# Patient Record
Sex: Female | Born: 1945
Health system: Southern US, Community
[De-identification: ages and names within clinical notes are randomized; demographics above are authoritative.]

## PROBLEM LIST (undated history)

## (undated) DIAGNOSIS — G4700 Insomnia, unspecified: Secondary | ICD-10-CM

## (undated) DIAGNOSIS — R112 Nausea with vomiting, unspecified: Secondary | ICD-10-CM

## (undated) DIAGNOSIS — N183 Chronic kidney disease, stage 3 unspecified: Secondary | ICD-10-CM

## (undated) DIAGNOSIS — Z9889 Other specified postprocedural states: Secondary | ICD-10-CM

## (undated) DIAGNOSIS — E785 Hyperlipidemia, unspecified: Secondary | ICD-10-CM

## (undated) DIAGNOSIS — J189 Pneumonia, unspecified organism: Secondary | ICD-10-CM

## (undated) DIAGNOSIS — F419 Anxiety disorder, unspecified: Secondary | ICD-10-CM

## (undated) DIAGNOSIS — M1990 Unspecified osteoarthritis, unspecified site: Secondary | ICD-10-CM

## (undated) DIAGNOSIS — F32A Depression, unspecified: Secondary | ICD-10-CM

## (undated) DIAGNOSIS — I10 Essential (primary) hypertension: Secondary | ICD-10-CM

## (undated) DIAGNOSIS — R519 Headache, unspecified: Secondary | ICD-10-CM

## (undated) DIAGNOSIS — F329 Major depressive disorder, single episode, unspecified: Secondary | ICD-10-CM

## (undated) DIAGNOSIS — T4145XA Adverse effect of unspecified anesthetic, initial encounter: Secondary | ICD-10-CM

## (undated) HISTORY — PX: BACK SURGERY: SHX140

## (undated) HISTORY — PX: SMALL INTESTINE SURGERY: SHX150

## (undated) HISTORY — PX: TONSILLECTOMY: SUR1361

## (undated) HISTORY — DX: Chronic kidney disease, stage 3 unspecified: N18.30

## (undated) HISTORY — PX: OTHER SURGICAL HISTORY: SHX169

## (undated) HISTORY — DX: Chronic kidney disease, stage 3 (moderate): N18.3

## (undated) HISTORY — PX: ABDOMINAL HYSTERECTOMY: SHX81

---

## 1998-12-27 ENCOUNTER — Other Ambulatory Visit: Admission: RE | Admit: 1998-12-27 | Discharge: 1998-12-27 | Payer: Self-pay | Admitting: Gynecology

## 2000-01-02 ENCOUNTER — Other Ambulatory Visit: Admission: RE | Admit: 2000-01-02 | Discharge: 2000-01-02 | Payer: Self-pay | Admitting: Gynecology

## 2000-12-07 ENCOUNTER — Other Ambulatory Visit: Admission: RE | Admit: 2000-12-07 | Discharge: 2000-12-07 | Payer: Self-pay | Admitting: Gynecology

## 2001-03-31 ENCOUNTER — Encounter: Admission: RE | Admit: 2001-03-31 | Discharge: 2001-03-31 | Payer: Self-pay | Admitting: Family Medicine

## 2001-03-31 ENCOUNTER — Encounter: Payer: Self-pay | Admitting: Family Medicine

## 2001-04-12 ENCOUNTER — Ambulatory Visit (HOSPITAL_COMMUNITY): Admission: RE | Admit: 2001-04-12 | Discharge: 2001-04-12 | Payer: Self-pay | Admitting: *Deleted

## 2001-12-14 ENCOUNTER — Other Ambulatory Visit: Admission: RE | Admit: 2001-12-14 | Discharge: 2001-12-14 | Payer: Self-pay | Admitting: Gynecology

## 2002-12-15 ENCOUNTER — Other Ambulatory Visit: Admission: RE | Admit: 2002-12-15 | Discharge: 2002-12-15 | Payer: Self-pay | Admitting: Gynecology

## 2004-01-09 ENCOUNTER — Other Ambulatory Visit: Admission: RE | Admit: 2004-01-09 | Discharge: 2004-01-09 | Payer: Self-pay | Admitting: Gynecology

## 2005-01-13 ENCOUNTER — Other Ambulatory Visit: Admission: RE | Admit: 2005-01-13 | Discharge: 2005-01-13 | Payer: Self-pay | Admitting: Gynecology

## 2006-01-14 ENCOUNTER — Other Ambulatory Visit: Admission: RE | Admit: 2006-01-14 | Discharge: 2006-01-14 | Payer: Self-pay | Admitting: Gynecology

## 2007-01-18 ENCOUNTER — Encounter (INDEPENDENT_AMBULATORY_CARE_PROVIDER_SITE_OTHER): Payer: Self-pay | Admitting: Family Medicine

## 2007-01-18 ENCOUNTER — Ambulatory Visit: Payer: Self-pay | Admitting: Internal Medicine

## 2007-01-18 LAB — CONVERTED CEMR LAB
Albumin: 4.8 g/dL (ref 3.5–5.2)
Alkaline Phosphatase: 55 units/L (ref 39–117)
CO2: 28 meq/L (ref 19–32)
Calcium: 9.3 mg/dL (ref 8.4–10.5)
Chloride: 106 meq/L (ref 96–112)
Eosinophils Absolute: 0.3 10*3/uL (ref 0.0–0.7)
Glucose, Bld: 89 mg/dL (ref 70–99)
LDL Cholesterol: 101 mg/dL — ABNORMAL HIGH (ref 0–99)
Lymphocytes Relative: 27 % (ref 12–46)
Lymphs Abs: 1.4 10*3/uL (ref 0.7–3.3)
Neutro Abs: 2.9 10*3/uL (ref 1.7–7.7)
Neutrophils Relative %: 58 % (ref 43–77)
Platelets: 230 10*3/uL (ref 150–400)
Potassium: 5 meq/L (ref 3.5–5.3)
RBC: 4.27 M/uL (ref 3.87–5.11)
Sodium: 142 meq/L (ref 135–145)
Total Protein: 7.5 g/dL (ref 6.0–8.3)
Triglycerides: 204 mg/dL — ABNORMAL HIGH (ref <150)
WBC: 5 10*3/uL (ref 4.0–10.5)

## 2007-02-10 ENCOUNTER — Ambulatory Visit: Payer: Self-pay | Admitting: Internal Medicine

## 2007-02-17 ENCOUNTER — Ambulatory Visit (HOSPITAL_COMMUNITY): Admission: RE | Admit: 2007-02-17 | Discharge: 2007-02-17 | Payer: Self-pay | Admitting: Family Medicine

## 2007-02-22 ENCOUNTER — Ambulatory Visit: Payer: Self-pay | Admitting: Internal Medicine

## 2007-02-23 ENCOUNTER — Ambulatory Visit: Payer: Self-pay | Admitting: *Deleted

## 2007-02-24 ENCOUNTER — Ambulatory Visit: Payer: Self-pay | Admitting: Family Medicine

## 2007-02-24 ENCOUNTER — Ambulatory Visit: Payer: Self-pay | Admitting: Internal Medicine

## 2007-04-12 ENCOUNTER — Ambulatory Visit: Payer: Self-pay | Admitting: Family Medicine

## 2007-04-12 ENCOUNTER — Encounter (INDEPENDENT_AMBULATORY_CARE_PROVIDER_SITE_OTHER): Payer: Self-pay | Admitting: Internal Medicine

## 2007-04-12 LAB — CONVERTED CEMR LAB
HDL: 47 mg/dL (ref >39)
LDL Cholesterol: 88 mg/dL (ref 0–99)
Triglycerides: 91 mg/dL (ref <150)

## 2007-06-18 ENCOUNTER — Ambulatory Visit: Payer: Self-pay | Admitting: Family Medicine

## 2007-07-01 ENCOUNTER — Ambulatory Visit: Payer: Self-pay | Admitting: Internal Medicine

## 2007-07-13 ENCOUNTER — Ambulatory Visit: Payer: Self-pay | Admitting: Internal Medicine

## 2007-07-22 ENCOUNTER — Ambulatory Visit: Payer: Self-pay | Admitting: Internal Medicine

## 2007-08-06 ENCOUNTER — Ambulatory Visit: Payer: Self-pay | Admitting: Internal Medicine

## 2007-08-19 ENCOUNTER — Ambulatory Visit: Payer: Self-pay | Admitting: Internal Medicine

## 2007-08-30 ENCOUNTER — Ambulatory Visit: Payer: Self-pay | Admitting: Internal Medicine

## 2007-10-16 ENCOUNTER — Inpatient Hospital Stay (HOSPITAL_COMMUNITY): Admission: EM | Admit: 2007-10-16 | Discharge: 2007-10-25 | Payer: Self-pay | Admitting: Emergency Medicine

## 2007-11-18 ENCOUNTER — Other Ambulatory Visit: Admission: RE | Admit: 2007-11-18 | Discharge: 2007-11-18 | Payer: Self-pay | Admitting: Gynecology

## 2007-11-25 ENCOUNTER — Encounter: Admission: RE | Admit: 2007-11-25 | Discharge: 2007-11-25 | Payer: Self-pay | Admitting: General Surgery

## 2008-05-05 HISTORY — PX: TRIGGER FINGER RELEASE: SHX641

## 2009-02-07 ENCOUNTER — Ambulatory Visit (HOSPITAL_BASED_OUTPATIENT_CLINIC_OR_DEPARTMENT_OTHER): Admission: RE | Admit: 2009-02-07 | Discharge: 2009-02-07 | Payer: Self-pay | Admitting: Orthopedic Surgery

## 2010-06-05 HISTORY — PX: TRIGGER FINGER RELEASE: SHX641

## 2010-06-12 ENCOUNTER — Ambulatory Visit (HOSPITAL_BASED_OUTPATIENT_CLINIC_OR_DEPARTMENT_OTHER)
Admission: RE | Admit: 2010-06-12 | Discharge: 2010-06-12 | Disposition: A | Discharge status: Home / Self Care | Payer: MEDICARE | Source: Ambulatory Visit | Attending: Orthopedic Surgery | Admitting: Orthopedic Surgery

## 2010-06-12 DIAGNOSIS — M6530 Trigger finger, unspecified finger: Secondary | ICD-10-CM | POA: Insufficient documentation

## 2010-06-12 DIAGNOSIS — M65849 Other synovitis and tenosynovitis, unspecified hand: Secondary | ICD-10-CM | POA: Insufficient documentation

## 2010-06-12 DIAGNOSIS — M65839 Other synovitis and tenosynovitis, unspecified forearm: Secondary | ICD-10-CM | POA: Insufficient documentation

## 2010-06-12 LAB — POCT I-STAT, CHEM 8
BUN: 20 mg/dL (ref 6–23)
Calcium, Ion: 1.08 mmol/L — ABNORMAL LOW (ref 1.12–1.32)
Creatinine, Ser: 1.2 mg/dL (ref 0.4–1.2)
TCO2: 22 mmol/L (ref 0–100)

## 2010-06-24 NOTE — Op Note (Signed)
[name], Gloria [name]                ACCOUNT NO.:  [id number]  MEDICAL RECORD NO.:  [id number]           PATIENT TYPE:  LOCATION:                                 FACILITY:  PHYSICIAN:  Cindee Salt, M.D.            DATE OF BIRTH:  DATE OF PROCEDURE:  06/12/2010 DATE OF DISCHARGE:                              OPERATIVE REPORT   PREOPERATIVE DIAGNOSIS:  Stenosing tenosynovitis, right middle, right ring finger.  POSTOPERATIVE DIAGNOSIS:  Stenosing tenosynovitis, right middle, right ring finger.  OPERATION:  Release of A1 pulley, right middle, right ring finger.  SURGEON:  Cindee Salt, MD  ANESTHESIA:  Forearm based IV regional with local infiltration.  ANESTHESIOLOGIST:  Janetta Hora. Gelene Mink, MD  HISTORY:  The patient is a 65 year old female with a history of triggering of her right middle, right ring finger.  This is not responded to conservative treatment.  She has elected to undergo surgical decompression of the A1 pulleys of both fingers.  Pre, peri, and postoperative courses have been discussed along with risks and complication.  She is aware that there is no guarantee with the surgery, possibility of infection, recurrence of injury to arteries, nerves, and tendons, incomplete relief of symptoms, and dystrophy.  In the preoperative area, the patient is seen, the extremity marked by both the patient and surgeon, and antibiotic given.  PROCEDURE:  The patient was brought to the operating room where a forearm based IV regional anesthetic was carried out without difficulty. She was prepped using ChloraPrep, supine position, right arm free.  A 3- minute dry time was allowed.  Time-out taken confirming the patient and procedure.  A local infiltration was given with 0.25% Marcaine without epinephrine in 3 mL total for the two fingers was used.  After adequate anesthesia was afforded, an oblique incision was made over the A1 pulley of the middle and then ring finger carried down  through the subcutaneous tissue.  Bleeders were electrocauterized with bipolar.  Dissection was carried down to the A1 pulley.  Radial and ulnar neurovascular bundles were identified.  Retractor was placed.  Incisions were then made in the radial aspect of the A1 pulley.  Small incision was made centrally in the A2 pulley on each finger.  These were done separately.  The fingers were placed through a full range of motion and further triggering was noted.  A partial tenosynovectomy proximal to the A1 pulley was then performed on each finger separating the adherent tenosynovium between the superficialis profundus on each fingers.  Wounds were copiously irrigated with saline.  Skin was closed with interrupted 5-0 Vicryl Rapide suture.  Sterile compressive dressing was applied.  No splint applied, fingers were left free.  On deflation of the tourniquet, all fingers were immediately pinked.  She was taken to the recovery room for observation in satisfactory condition.  She will be discharged home to return to the [facility] [place] in 1 week, on Vicodin.    ______________________________ Cindee Salt, M.D.   ______________________________ Cindee Salt, M.D.    GK/MEDQ  D:  06/12/2010  T:  06/13/2010  Job:  [id number]  Electronically Signed by Cindee Salt M.D. on 06/24/2010 02:25:32 PM

## 2010-08-08 LAB — BASIC METABOLIC PANEL
CO2: 29 mEq/L (ref 19–32)
Calcium: 9.2 mg/dL (ref 8.4–10.5)
Creatinine, Ser: 1 mg/dL (ref 0.4–1.2)
GFR calc Af Amer: >60 mL/min (ref >60)
Glucose, Bld: 154 mg/dL — ABNORMAL HIGH (ref 70–99)

## 2010-09-17 NOTE — Consult Note (Signed)
NAMEMAKALYA, NAVE                ACCOUNT NO.:  [id number]   MEDICAL RECORD NO.:  [id number]          PATIENT TYPE:  INP   LOCATION:  1306                         FACILITY:  [facility]   PHYSICIAN:  Antonietta Breach, M.D.  DATE OF BIRTH:  08-Apr-1946   DATE OF CONSULTATION:  10/21/2007  DATE OF DISCHARGE:                                 CONSULTATION   REASON FOR CONSULTATION:  Anxiety.   HISTORY OF PRESENT ILLNESS:  Mrs. Mort is a 65 year old female  admitted to [facility] __________ due to small bowel  obstruction.   Additional stress for the patient has been the onset of Parkinson's  disease in her husband.   The patient has had some mild return of depressed mood, decreased energy  and difficulty concentrating.  She also has had the onset of severe  anxiety episodes involving some general shaking of her body, tachycardia  and feeling on edge.  She does have a history of post-traumatic symptoms  due to multiple abdominal surgeries in the past for small-bowel  obstruction.   The patient has no thoughts of harming herself.  She has no thoughts of  harming others.  She has no delusions or hallucinations.  She has  remained completely oriented, coherent and with intact memory during the  shaking episodes.   She has not had any racing thoughts.  She is cooperative with all  bedside care.   The increased anxiety has been occurring over the past 3 days.  Her mild  depressive symptoms have returned with the onset of the bowel  obstruction.   PAST PSYCHIATRIC HISTORY:  The patient first developed major depression  approximately 3 years ago.  She was initially treated at the [facility] with Paxil.  The Paxil improved her anxiety but did not help her  depression.   She was later switched over to Cymbalta, and this was titrated  eventually up to 120 mg daily.  In lower dosages of Cymbalta, her  depression responded well.  However, she continued to have anxiety  symptoms, and the Cymbalta was then titrated up to 120 mg daily.  She  states that when the Cymbalta is high, she __________.  She has also  clearly correlated an increase in constipation with the Cymbalta.   One year ago the patient stopped her Cymbalta, and she had a return of  severe major depression.  Her depression has involved thoughts of  wanting to die but no suicidal thoughts.   The patient has been through counseling, but she states that she has  never had cognitive behavioral therapy with deep breathing and  progressive muscle relaxation training.  This was confirmed when the  undersigned explained the details of this therapy, and she was not  familiar with it at all.   The patient has also utilized Xanax 0.5 mg b.i.d. for acute feeling on  edge at home.  This works well.  She requires Ambien 10 mg q.h.s. for  insomnia.   The patient has had post-traumatic symptoms from the multiple bowel  surgeries.   FAMILY PSYCHIATRIC HISTORY:  None known.  SOCIAL HISTORY:  Mrs. Leija is married.  She does not have any  children.  She has worked at [employer] for several years.  She is  originally from [place].  She does not use any legal drugs.   PAST MEDICAL HISTORY:  1. The patient had gynecological surgery in her teens which resulted      in abdominal adhesions and subsequent multiple bowel obstructions      and surgeries.  2. The patient has also had a laminectomy in the past.   ALLERGIES:  NO KNOWN DRUG ALLERGIES.   MEDICATIONS:  The MAR is reviewed.  The patient's Cymbalta was stopped  on admission due to the need to use an NG tube.  The patient is now on  Ativan 1 mg q.6h. p.r.n. and Benadryl 20/500 q.6h. p.r.n.   LABORATORY DATA:  Sodium 140, BUN 5, creatinine 0.69, glucose 119, WBC  8.4, hemoglobin 0.4, platelet count 236, SGOT 17, SGPT 17.   REVIEW OF SYSTEMS:  Constitutional, head, eyes, ears nose, throat,  mouth, neurologic, psychiatric, cardiovascular,  respiratory,  gastrointestinal, genitourinary, skin, musculoskeletal, lymphatic,  endocrine, metabolic all unremarkable.   PHYSICAL EXAMINATION:  VITAL SIGNS:  Temperature 98, pulse 89,  respiratory rate 18, blood pressure 149/82, O2 saturation on 3 liters  98%.  GENERAL APPEARANCE:  Mrs. Mikrut is a middle-aged female partially  reclined in a supine position in her hospital bed with the NG tube in  place.  She has no abnormal involuntary movements.   MENTAL STATUS EXAM:  Mrs. Pinheiro is under mild Ativan sedation to  control her anxiety.  Her attention span is mildly decreased.  Her  alertness is slightly decreased.  Her eye contact is excellent.  She is  oriented completely to all spheres.  Her memory function is intact for  immediate and remote.  Her fund of knowledge and intelligence are within  normal limits.  Her speech is very soft.  There is a slightly flat  prosody.  There is no dysarthria.  Affect is mildly constricted.  Mood  is mildly anxious.  Thought process is logical, coherent, goal-directed.  Thought content with no thoughts of harming herself, no thoughts of  harming others, no delusions, no hallucinations.  Her insight is good.  Her judgment is intact.   ASSESSMENT:  AXIS I:  293.84 - anxiety disorder not otherwise specified,  rule out post-traumatic stress disorder.  Major depressive disorder,  recurrent, in partial remission.  AXIS II:  None.  AXIS III:  See past medical history.  AXIS IV:  General medical.  AXIS V:  55.   Mrs. Braunschweig is not at risk to harm herself or others.  She agrees to  call emergency services immediately for any thoughts of harming herself  or other psychiatric emergencies.   The undersigned provided ego supportive psychotherapy and education, an  introduction to cognitive behavioral therapy was given and the potential  efficacy.   The indications, alternatives and adverse effects of several agents were  discussed including Paxil,  Cymbalta, Paxil alone or Paxil combined with  Wellbutrin, Xanax, Ambien, Ativan.   The patient understands and would like to proceed as below with the  possibility of other medication alternatives, proceeding later as an  outpatient.   RECOMMENDATIONS:  1. Would restart the Cymbalta at 30 mg p.o. q.a.m. when the NG tube is      clamped for 1 hour.  Would then increase as tolerated to 60 mg p.o.  q.a.m.  Once the NG tube can be removed, optimal dosing would be 30      mg b.i.d.  The medication can be further titrated as an outpatient.      However, to avoid the side effect of fatigue with increased doses      of Cymbalta, a strategy of cognitive behavioral therapy with deep      breathing and progressive muscle relaxation could help reduce the      anxiety symptoms without having to resort to greater      pharmacotherapy.  Also, the psychotherapy could reduce the long-      term need of benzodiazepine use.   This psychotherapy can be arranged with a therapist trained and  experienced in cognitive behavioral therapy with deep breathing and  progressive muscle relaxation.  This therapy has been used effectively  with specifically post-traumatic stress disorder, as well.   Other pharmacotherapy options would include restarting the Paxil and  then adding Wellbutrin for the residual depressive symptoms.  The  tendency to cause constipation can be seen in a number of psychotropic  agents.   The patient is comfortable with the idea of using MiraLax to prevent  constipation.   For now in the hospital regarding the patient's acute anxiety and  insomnia, would continue to proceed with Ativan 0.5 to 1 mg IV q.6h.  p.r.n. anxiety or nocturnal insomnia.  This can be given p.o. or IM if  needed, and once the NG tube is removed, the patient could be switched  back to Xanax at the same dosage as the Ativan.   Another advantage of Ativan is the fact that it does not have any active   metabolites and is only conjugated by the liver, which allows for  greater sedation control while in the hospital.      Antonietta Breach, M.D.  Electronically Signed     JW/MEDQ  D:  10/21/2007  T:  10/21/2007  Job:  [id number]

## 2010-09-17 NOTE — Discharge Summary (Signed)
[name], [name]                ACCOUNT NO.:  [id number]   MEDICAL RECORD NO.:  [id number]          PATIENT TYPE:  INP   LOCATION:  1306                         FACILITY:  [facility]   PHYSICIAN:  Anselm Pancoast. Weatherly, M.D.DATE OF BIRTH:  1945-11-29   DATE OF ADMISSION:  10/16/2007  DATE OF DISCHARGE:  10/25/2007                               DISCHARGE SUMMARY   DISCHARGE DIAGNOSIS:  __________   OPERATION:  Exploratory laparotomy with lysis of adhesions.  __________   HISTORY:  Gloria [name] is a 65 year old female.  She passed __________  and has not __________ problems __________ interim with __________.  She  had had 3 operations for bowel obstruction following __________  and had  had 3 hospitalizations__________.  She moved to [facility], she got a  job __________ stress and __________ problems __________.  She presented  to the emergency room __________ showed very edematous small bowel,  moderate __________ did not __________.  Marland Kitchen  White count when she  arrived__________ and she had amount of  stoolin the colon.  area  __________ where she looked much better with __________ Dr. Daphine Deutscher  __________ watch her carefully __________ OR __________ in [place]  __________resolved __________.  The patient continued to feel better.  She __________ small effusion in the right chest that was present on  initial plain films and CT did not change the etiology of this.  Clinically, she did better with intermittent NG suction.  She was not  actually having bowel function spontaneously.  I repeated the CT. This  shows obstruction secondary to probable adhesions.  __________ assisted.  At the time of surgery, she had significant area of adhesions with near  complete obstruction.  This was freed up..  I had given her psychiatric  .  Postoperatively, she did get a little anxious.  He suggested  __________postop ileus appeared to be __________.  I was given  __________ referral __________.  At this  time __________ .  She  __________ discharge __________.  Following her chest x-ray performed  which really did not show any change Her obstruction is in the__________  right fluid around the bowel that we should have seen __________ fusion  __________  have the __________ chest x-ray or a CT __________           ______________________________  Anselm Pancoast. Zachery Dakins, M.D.     WJW/MEDQ  D:  11/17/2007  T:  11/17/2007  Job:  [id number]

## 2010-09-17 NOTE — H&P (Signed)
NAMEMAMYE, BOLDS                ACCOUNT NO.:  [id number]   MEDICAL RECORD NO.:  [id number]          PATIENT TYPE:  INP   LOCATION:  1306                         FACILITY:  [facility]   PHYSICIAN:  Anselm Pancoast. Weatherly, M.D.DATE OF BIRTH:  06-Sep-1945   DATE OF ADMISSION:  10/16/2007  DATE OF DISCHARGE:                              HISTORY & PHYSICAL   CHIEF COMPLAINT:  Nausea, vomiting, and abdominal pain.   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  She has a past history of having numerous operations for bowel  obstruction when she lived in [place].  This was prior to her moving to  [facility] about 11 years ago, and she states that she has had 5  abdominal surgeries, 3 for bowel obstruction, but then the last 3 years  prior to moving she had 3 hospitalizations for which she was treated  with IV fluids, NG suction, etc., and did not require additional  surgery.  Since moving here, she has not had any abdominal surgery and  on review of her records and the E-chart I find no evidence of any  hospitalizations.  She states that she has had a problem with stress and  etc., and is on Cymbalta 120 mg daily, which she knows causes  constipation, and she is also Xanax 0.5 mg b.i.d.  She is on lisinopril  10 mg a day for mild hypertension and pravastatin 20 mg a day.  As far  as on her previous surgeries, she originally started off with  endometriosis and then had surgery for GYN and then bowel obstructions  on several occasions, and on her incisions it appears that everything  was done through either lower or an upper or a kind of combination  midline incisions.  She said she has had some of her bowel resected, and  she has also had a laminectomy.  Her family history does not appear to  be contributing anything, and she  denies any allergies.   CHRONIC MEDICATIONS:  1. Cymbalta 120 mg daily.  2. Lisinopril 10 mg daily.  3. Pravastatin 20 mg.  4. Oral estrogen.  5. Zolpidem 10 mg a day.  6. Xanax 0.5 mg b.i.d.   She says that her stress situation is partially caused by a husband, who  appears to be coming down with Parkinson disease.  He was not present  when I examined her.  As far as on questioning, she stated that she for  about a week has had problems with constipation.  She has taken stool  softeners from above.  About a month ago, she gave herself an enema and  then she has had basically no results for nearly a week, and then  yesterday at about 3 p.m. started having pretty severe cramping  abdominal pain and then came to the emergency room at approximately 3  a.m.  She was seen by the ER physician and on examination in the ER  when  she came in her pulse was a little elevated of 96, respirations 18, and  she was afebrile, and her blood pressure was 149/84.  They gave her  pretty strong pain medications, Dilaudid, I think, and then I was called  about 7:30 and they said Medicine would see her and possibly admit her,  but the CT that they had performed had question of an ischemic segment  of bowel in the right lower quadrant.  Medicine had not seen her at the  time that I first saw her and on question this certainly appears to me  to be a general surgical issue and not that of a medical issue.   PHYSICAL EXAMINATION:  VITAL SIGNS:  Blood pressure is 135/79, she is  afebrile, her pulse is 85, her respirations 20.  EYES, EARS, NOSE, AND THROAT:  She has had facial plastic surgery,  appears adequately hydrated.  There are no oral lesions and etc.  LUNGS:  Clear.  CARDIAC:  Negative.  ABDOMEN:  Very quiet, but she had received pain medications not too long  prior to the time I saw her, and after reviewing the CT and showing all  the contrast in her stomach in the proximally dilated small bowel, an  NG  tube was placed and about one-and-a-third canisters built up  immediately.  She has a large amount of stool in her colon and the nurse  gave her an enema while she was still in the emergency room with kind of  fair results.  It is now approximately 4 hours later.  The patient is up  on the floor and says she thinks she is feeling better.  She has had a  liter of IV fluids, and on physical exam now she has slight tenderness  in the lower right abdomen, but not that what I would consider a true  surgical abdomen.  The repeat x-rays were obtained, plain, approximately  1 p.m. and it does show that the stool that was over in the rectum,  etc., has been expelled.  There is still a massive amount of stool in  the upper abdomen, transverse colon, and right colon, and I think  another enema later this afternoon would be beneficial.  A repeat white  blood count is 10,200, and she is not on any antibiotics.  She is  afebrile.   IMPRESSION:  Hopefully, this is just a very distended loop of small  bowel and not further development of intestinal ischemia.  She certainly  has a history of chronic constipation and I think this is partially  related to her psychiatric medications and she is going to need chronic  laxatives.  I am going to repeat a CBC in the morning and we will  reexamine her.  If she clinically gets better, we will discontinue with  the nasogastric suction, enemas, and hopefully not have to do any  surgery.  If she is having increasing pain in the lower abdomen, surgery  is certainly not out of the question.  I had Dr. Daphine Deutscher also review her  computerized tomography and he, as I, were of the opinion that this is  probably not a closed loop from looking at the x-ray and hopefully she  is improving as clinically it appears that she is.           ______________________________  Anselm Pancoast. Zachery Dakins, M.D.     WJW/MEDQ  D:  10/16/2007  T:  10/16/2007  Job:  [id number]

## 2010-09-17 NOTE — Discharge Summary (Signed)
Gloria [name], Gloria [name]                ACCOUNT NO.:  [id number]   MEDICAL RECORD NO.:  [id number]          PATIENT TYPE:  INP   LOCATION:  1306                         FACILITY:  [facility]   PHYSICIAN:  Anselm Pancoast. Weatherly, M.D.DATE OF BIRTH:  [birth year]   DATE OF ADMISSION:  10/16/2007  DATE OF DISCHARGE:  10/25/2007                               DISCHARGE SUMMARY   The discharge summary of small bowel obstruction secondary to adhesions.   OPERATION:  Exploratory laparotomy and lysis of adhesions.   HISTORY:  Gloria [name] is a 65 year old Caucasian female who used to  live in [place].  She had had multiple abdominal surgeries for what  started off as a GYN problem and then bowel obstructions.  Stated that  she had moved to [facility] about 11 years ago and that she has not had  any previous hospitalizations for the abdominal problem during that  interim. She had had about I think 4 previous surgeries and then she had  approximately 3 hospitalizations for medical management of a bowel  obstruction, but the obstruction was relieved with nasogastric tubes and  IV fluids, etc.  She moved to [facility] and started working for the  [employer], Dr. Lindie Spruce and Dr. 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  She said that  more recently she has had a lot of problems with stress since her  husband is having decreased neurological function.  She started having  severe abdominal pain approximately 24 hours prior to presenting to the  ER at [facility].  The patient states that she takes Cymbalta 120 mg  daily for psychiatric issues and it causes problem with constipation.  She is also on Xanax 0.5 mg b.i.d.  She is on lisinopril 10 mg for mild  hypertension and pravastatin 20 mg a day.  In the emergency room, she  had a very dilated proximal small bowel and what looked like an  edematous short segment of small bowel in the right lower quadrant, but  when I saw her she stated that she was feeling better.  She was hopeful  that this episode would resolve like the last 3 had resolved when she  was still living in [place].  Because of the questionable findings on  the CT, I also had Dr. Daphine Deutscher to reexamine her. He examined her and was  in agreement that she certainly clinically did not have the findings on  abdominal exam like acute surgical abdomen and we thought it would be  safe to continue with the medical management.  She did have a chronic  effusion on the right that we never actually identified the origin.  The  following day, the plain abdominal films actually looked better, but she  was still not having any actual bowel function.  Then on the second day,  repeat x-ray still showed a dilated small bowel.  She was not having  bowel function and even though that she was not in an acute situation,  we thought that surgical intervention would be necessary.  She  was taken  to surgery, Dr. Colin Benton assisted and she was found to have an area of  dense adhesions in the right lower quadrant with kind of internal hernia  that was causing the blockage.  The bowel itself was definitely viable,  but there was what appeared to be a stricture where this had been  chronically narrowed that I repaired with an open stricturoplasty.  Postoperatively, she did satisfactory on about the second postoperative  day and now she had been off of her psychiatric medications for several  days.  She was becoming quite anxious and we did have Dr. Jeanie Sewer to  see her and his recommendations were to resume the psychiatric  medications as soon as possible.  She still had a nasogastric tube, but  she was improving.  I was going on vacation and Dr. Abbey Chatters who knew  her, managed her care for the last several days.  Her GI function  returned.  We were able to remove the NG tube, start her on a liquid  diet and she was clinically improved.  A repeat chest x-ray was   performed and  she still has this effusion.  A CT did not show any  pulmonary lesions or area of question.  We will follow up the right  pleural effusion as an outpatient.  She was able to resume her  psychiatric medications when her bowel function returned.  Her incision  appears to be healing nicely.  She will see me in followup in  approximately 1 week.  Whether the right pleural effusion will resolve  which hopefully it will or whether it is going to need further  evaluation will be determined at that time.   DISCHARGE PROBLEM:  Adhesions secondary to previous abdominal surgery  with a stricture from this kind of chronic area of irritation.           ______________________________  Anselm Pancoast. Zachery Dakins, M.D.     WJW/MEDQ  D:  11/25/2007  T:  11/25/2007  Job:  [id number]   cc:   Anselm Pancoast. Zachery Dakins, M.D.  [address], [address]  [place]  [place] [address]

## 2010-09-17 NOTE — Op Note (Signed)
NAMELAVELL, RIDINGS                ACCOUNT NO.:  [id number]   MEDICAL RECORD NO.:  [id number]          PATIENT TYPE:  INP   LOCATION:  1306                         FACILITY:  [facility]   PHYSICIAN:  Anselm Pancoast. Weatherly, M.D.DATE OF BIRTH:  1945-10-26   DATE OF PROCEDURE:  10/19/2007  DATE OF DISCHARGE:                               OPERATIVE REPORT   PREOPERATIVE DIAGNOSIS:  Small-bowel obstruction, probably secondary to  adhesions.   POSTOPERATIVE DIAGNOSES:  Small-bowel obstruction secondary to adhesions  with stricture mid ileum.   OPERATION:  Exploratory laparotomy with lysis of adhesions and  enterotomy for release of stricture.   ANESTHESIA:  General anesthesia.   SURGEON:  Dr. Anselm Pancoast. Weatherly.   ASSISTANT:  Dr. Alfonse Ras.   HISTORY:  Gloria [name] is a 65 year old Caucasian female who gives the  following history.  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  She moved here about 11 years ago  and then in that interval of time, she has not had further problems.  She has a chronic problem with constipation, and has recently been under  some stress secondary to her husband having a progression neurological  issue.  She used to be [employer] and [employer], but is  now working for [employer].  At approximately 3:00 p.m. on Friday, 4  days ago, she started having severe cramping abdominal pain.  She came  to the emergency room in the early a.m. on Saturday, and I was asked to  see her at approximately 7:00 a.m. on Saturday.  On examination, she was  not acutely tender.  She was distended and the CT that they had done had  massive stool throughout her colon.  There was an area of small bowel  that was certainly edematous and questionable viability, and she  wanted  to try conservative management since she had three hospitalizations  prior to moving here 11 years ago and similar symptoms had been relieved  with NG tubes, IV fluids and pain medication.  An NG tube was placed and  she had a canister and a half of succus entericus removed, and then her  white count was not that elevated and I hydrated here, gave her an enema  in the ER and another enema when she got to the floor, and then on  reexamining her, she was really pain free and said she was feeling  better.  We continued with another enema the following morning.  Repeat  white count was normal, and then I did a plain abdominal film on Sunday  which was definitely improved.  She said she was pain free and was still  hopeful that surgery was not going to be needed.  I elected to get a  repeat CT on Monday morning.  The edematous loop of small bowel had  resolved, but she now had certainly what looked like a mechanical small-  bowel obstruction with and a near complete blockage down in the distal  small bowel.  We got follow-up delayed films about 3 hours later and  there was a little contrast going into the distal small bowel.  Yesterday was just a horrible OR schedule, and I elected to kind of add  her to the OR schedule for today.  Today after discussing and reviewing  the films with her, she is in agreement.  Preoperatively, she was given  3 gm of Unasyn, and that was the first antibiotic that she has received  during this hospitalization.  She has had normal vital signs with no  fever.  When she was admitted on Saturday, the CT showed a moderate size  right pleural effusion in the base that we have no etiology for.  There  is no evidence of any nodularity within the lung.  She is not a  cigarette smoker.  I think what we will do is get her over the bowel  obstruction and then if this fluid does not resolve, then a  thoracentesis and a workup of that will be needed.   DESCRIPTION OF  PROCEDURE:  The patient preoperatively was given 3 gm of  Unasyn and she has PAS stockings, positioned on the OR table and a Foley  catheter was inserted after induction of general anesthesia.  She has  had lower midline incisions plus upper midline incisions and the two  kind of go together around the umbilicus with a curve out on the left.  After prepping her with Betadine, I elected to remove the multiple  incisions below the umbilicus with a little strip of skin and then  dissected on down into the fascia and then very cautiously entered the  peritoneal cavity about an inch below the umbilicus.  Fortunately, there  was free area in this area and then we could work extending the incision  inferiorly, and it looked like the pathology was really located right to  the left and below the umbilicus.  I very cautiously divided the fascia  and then freed up the loops of adhesions to the area and then there was  of fairly heavy band going across the mid ileum.  This was carefully  taken down without doing an enterotomy or any damage to the small bowel,  and then we lysed the adhesions.  And really with her multiple abdominal  surgeries, the adhesions were not as generalized as I would have  anticipated.  We freed up the adhesions to the very dilated proximal  small bowel and then distally to the terminal ileum, and then this area  where this band was, there was probably about a three-eighths of an inch  opening, and I think it is definitely a stricture.  I elected to go  ahead and divide the bowel on the antimesenteric side and then did kind  a like a little high connector with a single layer to release the  stricture.  This enterotomy was closed transversely with 3-0 silk  sutures with the knots inverted, and then the small bowel was laid back  into an anatomical position.  The omentum was adherent in the upper part  of the abdomen and really not much omentum, so there was not any omentum  to  bring down over the small bowel in the lower abdomen.  There was one  band going down to the pelvis that we freed up and the little finger of  omentum was ligated and tied  with 2-0 Vicryl.  Next, the midline fascia  was closed with a double looped PDS #1 and the skin was closed with  staples.  The NG tube was in good position.  I am planning on removing  the Foley catheter in the morning and encourage ambulation, and  hopefully she will be ready to start having bowel movements in the next  2-3 days.  The patient still had some solid stool within her colon, but  much less, and the little part that is now is kind of in the sigmoid.  She has not had a colonoscopy in the last probably 3-4 years, but her x-  rays were not that of a colon obstruction, but just kind of that of a  chronic constipated colon.  Hopefully with release of the stricture, the  motility and the consistency of her stools will be better.  Sponge and  needle counts were correct x2 and estimated blood loss was minimal.           ______________________________  Anselm Pancoast. Zachery Dakins, M.D.     WJW/MEDQ  D:  10/19/2007  T:  10/20/2007  Job:  [id number]

## 2011-01-30 LAB — BASIC METABOLIC PANEL
BUN: 5 — ABNORMAL LOW
BUN: 6
CO2: 29
CO2: 30
Calcium: 8.3 — ABNORMAL LOW
Chloride: 105
Chloride: 106
Chloride: 106
Creatinine, Ser: 0.76
Glucose, Bld: 112 — ABNORMAL HIGH
Glucose, Bld: 119 — ABNORMAL HIGH
Potassium: 4.2
Potassium: 3.3 — ABNORMAL LOW
Sodium: 138

## 2011-01-30 LAB — DIFFERENTIAL
Eosinophils Relative: 1
Lymphocytes Relative: 8 — ABNORMAL LOW
Lymphs Abs: 0.7
Monocytes Absolute: 0.4

## 2011-01-30 LAB — CBC
HCT: 36.7
HCT: 39.6
Hemoglobin: 15.6 — ABNORMAL HIGH
Hemoglobin: 13.6
MCHC: 34.4
MCHC: 34.8
MCHC: 33.9
MCV: 89.1
MCV: 89.7
Platelets: 234
Platelets: 251
Platelets: 236
RBC: 5.03
RBC: 4.46
RDW: 14.4
RDW: 14
WBC: 10.2
WBC: 6.7
WBC: 8.4

## 2011-01-30 LAB — LIPASE, BLOOD: Lipase: 23

## 2011-01-30 LAB — URINALYSIS, ROUTINE W REFLEX MICROSCOPIC
Bilirubin Urine: NEGATIVE
Nitrite: NEGATIVE
Specific Gravity, Urine: 1.021
pH: 6

## 2011-01-30 LAB — COMPREHENSIVE METABOLIC PANEL
AST: 17
Albumin: 4.4
Calcium: 9.4
Creatinine, Ser: 0.8
GFR calc Af Amer: >60
GFR calc non Af Amer: >60

## 2011-05-19 ENCOUNTER — Other Ambulatory Visit: Payer: Self-pay | Admitting: Gynecology

## 2011-05-19 DIAGNOSIS — Z1231 Encounter for screening mammogram for malignant neoplasm of breast: Secondary | ICD-10-CM

## 2011-05-30 DIAGNOSIS — H04123 Dry eye syndrome of bilateral lacrimal glands: Secondary | ICD-10-CM | POA: Insufficient documentation

## 2011-05-30 DIAGNOSIS — H2640 Unspecified secondary cataract: Secondary | ICD-10-CM | POA: Insufficient documentation

## 2011-12-03 ENCOUNTER — Ambulatory Visit
Admission: RE | Admit: 2011-12-03 | Discharge: 2011-12-03 | Disposition: A | Discharge status: Home / Self Care | Payer: Medicare Other | Source: Ambulatory Visit | Attending: Gynecology | Admitting: Gynecology

## 2011-12-03 DIAGNOSIS — Z1231 Encounter for screening mammogram for malignant neoplasm of breast: Secondary | ICD-10-CM

## 2011-12-26 ENCOUNTER — Other Ambulatory Visit: Payer: Self-pay | Admitting: Orthopedic Surgery

## 2012-01-14 ENCOUNTER — Encounter (HOSPITAL_BASED_OUTPATIENT_CLINIC_OR_DEPARTMENT_OTHER): Payer: Self-pay | Admitting: *Deleted

## 2012-01-14 NOTE — Progress Notes (Signed)
To come in for bmet-ekg  

## 2012-01-15 ENCOUNTER — Encounter (HOSPITAL_BASED_OUTPATIENT_CLINIC_OR_DEPARTMENT_OTHER)
Admission: RE | Admit: 2012-01-15 | Discharge: 2012-01-15 | Disposition: A | Discharge status: Home / Self Care | Payer: Medicare Other | Source: Ambulatory Visit | Attending: Orthopedic Surgery | Admitting: Orthopedic Surgery

## 2012-01-15 LAB — BASIC METABOLIC PANEL
BUN: 21 mg/dL (ref 6–23)
CO2: 29 mEq/L (ref 19–32)
Chloride: 103 mEq/L (ref 96–112)
GFR calc non Af Amer: 62 mL/min — ABNORMAL LOW (ref >90)
Glucose, Bld: 87 mg/dL (ref 70–99)
Potassium: 3.6 mEq/L (ref 3.5–5.1)

## 2012-01-20 ENCOUNTER — Encounter (HOSPITAL_BASED_OUTPATIENT_CLINIC_OR_DEPARTMENT_OTHER)
Admission: RE | Disposition: A | Discharge status: Home / Self Care | Payer: Self-pay | Source: Ambulatory Visit | Attending: Orthopedic Surgery

## 2012-01-20 ENCOUNTER — Ambulatory Visit (HOSPITAL_BASED_OUTPATIENT_CLINIC_OR_DEPARTMENT_OTHER)
Admission: RE | Admit: 2012-01-20 | Discharge: 2012-01-20 | Disposition: A | Discharge status: Home / Self Care | Payer: Medicare Other | Source: Ambulatory Visit | Attending: Orthopedic Surgery | Admitting: Orthopedic Surgery

## 2012-01-20 ENCOUNTER — Ambulatory Visit (HOSPITAL_BASED_OUTPATIENT_CLINIC_OR_DEPARTMENT_OTHER): Payer: Medicare Other | Admitting: Anesthesiology

## 2012-01-20 ENCOUNTER — Encounter (HOSPITAL_BASED_OUTPATIENT_CLINIC_OR_DEPARTMENT_OTHER): Payer: Self-pay | Admitting: Anesthesiology

## 2012-01-20 ENCOUNTER — Encounter (HOSPITAL_BASED_OUTPATIENT_CLINIC_OR_DEPARTMENT_OTHER): Payer: Self-pay | Admitting: *Deleted

## 2012-01-20 DIAGNOSIS — Z01812 Encounter for preprocedural laboratory examination: Secondary | ICD-10-CM | POA: Insufficient documentation

## 2012-01-20 DIAGNOSIS — I10 Essential (primary) hypertension: Secondary | ICD-10-CM | POA: Insufficient documentation

## 2012-01-20 DIAGNOSIS — G5600 Carpal tunnel syndrome, unspecified upper limb: Secondary | ICD-10-CM | POA: Insufficient documentation

## 2012-01-20 DIAGNOSIS — Z01810 Encounter for preprocedural cardiovascular examination: Secondary | ICD-10-CM | POA: Insufficient documentation

## 2012-01-20 HISTORY — DX: Essential (primary) hypertension: I10

## 2012-01-20 HISTORY — DX: Unspecified osteoarthritis, unspecified site: M19.90

## 2012-01-20 HISTORY — DX: Hyperlipidemia, unspecified: E78.5

## 2012-01-20 HISTORY — DX: Insomnia, unspecified: G47.00

## 2012-01-20 HISTORY — PX: CARPAL TUNNEL RELEASE: SHX101

## 2012-01-20 HISTORY — DX: Major depressive disorder, single episode, unspecified: F32.9

## 2012-01-20 HISTORY — DX: Anxiety disorder, unspecified: F41.9

## 2012-01-20 HISTORY — DX: Depression, unspecified: F32.A

## 2012-01-20 LAB — POCT HEMOGLOBIN-HEMACUE: Hemoglobin: 11.9 g/dL — ABNORMAL LOW (ref 12.0–15.0)

## 2012-01-20 SURGERY — CARPAL TUNNEL RELEASE
Anesthesia: General | Site: Hand | Laterality: Right | Wound class: Clean

## 2012-01-20 MED ORDER — CHLORHEXIDINE GLUCONATE 4 % EX LIQD: 60.0000 mL | Freq: Once | CUTANEOUS | Status: DC

## 2012-01-20 MED ORDER — PENTAZOCINE-NALOXONE 50-0.5 MG PO TABS: 1.0000 | ORAL_TABLET | ORAL | Status: DC | PRN

## 2012-01-20 MED ORDER — HYDROMORPHONE HCL PF 1 MG/ML IJ SOLN: 0.2500 mg | INTRAMUSCULAR | Status: DC | PRN

## 2012-01-20 MED ORDER — SUCCINYLCHOLINE CHLORIDE 20 MG/ML IJ SOLN
INTRAMUSCULAR | Status: DC | PRN
  Administered 2012-01-20: 40 mg via INTRAVENOUS

## 2012-01-20 MED ORDER — OXYCODONE HCL 5 MG/5ML PO SOLN: 5.0000 mg | Freq: Once | ORAL | Status: AC | PRN

## 2012-01-20 MED ORDER — CEFAZOLIN SODIUM-DEXTROSE 2-3 GM-% IV SOLR: 2.0000 g | INTRAVENOUS | Status: DC

## 2012-01-20 MED ORDER — PROPOFOL 10 MG/ML IV BOLUS
INTRAVENOUS | Status: DC | PRN
  Administered 2012-01-20: 100 mg via INTRAVENOUS

## 2012-01-20 MED ORDER — ONDANSETRON HCL 4 MG/2ML IJ SOLN
INTRAMUSCULAR | Status: DC | PRN
  Administered 2012-01-20: 4 mg via INTRAVENOUS

## 2012-01-20 MED ORDER — MIDAZOLAM HCL 5 MG/5ML IJ SOLN
INTRAMUSCULAR | Status: DC | PRN
  Administered 2012-01-20: 1 mg via INTRAVENOUS

## 2012-01-20 MED ORDER — LACTATED RINGERS IV SOLN
INTRAVENOUS | Status: DC
  Administered 2012-01-20: 08:00:00 via INTRAVENOUS

## 2012-01-20 MED ORDER — FENTANYL CITRATE 0.05 MG/ML IJ SOLN
INTRAMUSCULAR | Status: DC | PRN
  Administered 2012-01-20: 100 ug via INTRAVENOUS

## 2012-01-20 MED ORDER — LIDOCAINE HCL (CARDIAC) 20 MG/ML IV SOLN
INTRAVENOUS | Status: DC | PRN
  Administered 2012-01-20: 50 mg via INTRAVENOUS

## 2012-01-20 MED ORDER — OXYCODONE HCL 5 MG PO TABS
5.0000 mg | ORAL_TABLET | Freq: Once | ORAL | Status: AC | PRN
  Administered 2012-01-20: 5 mg via ORAL

## 2012-01-20 MED ORDER — DEXAMETHASONE SODIUM PHOSPHATE 4 MG/ML IJ SOLN
INTRAMUSCULAR | Status: DC | PRN
  Administered 2012-01-20: 10 mg via INTRAVENOUS

## 2012-01-20 MED ORDER — BUPIVACAINE HCL (PF) 0.25 % IJ SOLN
INTRAMUSCULAR | Status: DC | PRN
  Administered 2012-01-20: 5 mL

## 2012-01-20 MED ORDER — ONDANSETRON HCL 4 MG/2ML IJ SOLN: 4.0000 mg | Freq: Once | INTRAMUSCULAR | Status: DC | PRN

## 2012-01-20 MED ORDER — KETOROLAC TROMETHAMINE 30 MG/ML IJ SOLN
INTRAMUSCULAR | Status: DC | PRN
  Administered 2012-01-20: 30 mg via INTRAVENOUS

## 2012-01-20 SURGICAL SUPPLY — 38 items
BANDAGE GAUZE ELAST BULKY 4 IN (GAUZE/BANDAGES/DRESSINGS) ×2 IMPLANT
BLADE SURG 15 STRL LF DISP TIS (BLADE) ×1 IMPLANT
BLADE SURG 15 STRL SS (BLADE) ×2
BNDG CMPR 9X4 STRL LF SNTH (GAUZE/BANDAGES/DRESSINGS) ×1
BNDG COHESIVE 3X5 TAN STRL LF (GAUZE/BANDAGES/DRESSINGS) ×2 IMPLANT
BNDG ESMARK 4X9 LF (GAUZE/BANDAGES/DRESSINGS) ×1 IMPLANT
CHLORAPREP W/TINT 26ML (MISCELLANEOUS) ×2 IMPLANT
CLOTH BEACON ORANGE TIMEOUT ST (SAFETY) ×2 IMPLANT
CORDS BIPOLAR (ELECTRODE) ×2 IMPLANT
COVER MAYO STAND STRL (DRAPES) ×2 IMPLANT
COVER TABLE BACK 60X90 (DRAPES) ×2 IMPLANT
CUFF TOURNIQUET SINGLE 18IN (TOURNIQUET CUFF) ×2 IMPLANT
DRAPE EXTREMITY T 121X128X90 (DRAPE) ×2 IMPLANT
DRAPE SURG 17X23 STRL (DRAPES) ×2 IMPLANT
DRSG KUZMA FLUFF (GAUZE/BANDAGES/DRESSINGS) ×2 IMPLANT
GAUZE XEROFORM 1X8 LF (GAUZE/BANDAGES/DRESSINGS) ×2 IMPLANT
GLOVE BIO SURGEON STRL SZ 6.5 (GLOVE) ×2 IMPLANT
GLOVE BIO SURGEON STRL SZ7.5 (GLOVE) ×1 IMPLANT
GLOVE SURG ORTHO 8.0 STRL STRW (GLOVE) ×2 IMPLANT
GOWN BRE IMP PREV XXLGXLNG (GOWN DISPOSABLE) ×3 IMPLANT
GOWN PREVENTION PLUS XLARGE (GOWN DISPOSABLE) ×2 IMPLANT
NDL SAFETY ECLIPSE 18X1.5 (NEEDLE) IMPLANT
NEEDLE 27GAX1X1/2 (NEEDLE) ×1 IMPLANT
NEEDLE HYPO 18GX1.5 SHARP (NEEDLE) ×2
NS IRRIG 1000ML POUR BTL (IV SOLUTION) ×2 IMPLANT
PACK BASIN DAY SURGERY FS (CUSTOM PROCEDURE TRAY) ×2 IMPLANT
PAD CAST 3X4 CTTN HI CHSV (CAST SUPPLIES) ×1 IMPLANT
PADDING CAST ABS 4INX4YD NS (CAST SUPPLIES) ×1
PADDING CAST ABS COTTON 4X4 ST (CAST SUPPLIES) ×1 IMPLANT
PADDING CAST COTTON 3X4 STRL (CAST SUPPLIES) ×2
SPONGE GAUZE 4X4 12PLY (GAUZE/BANDAGES/DRESSINGS) ×2 IMPLANT
STOCKINETTE 4X48 STRL (DRAPES) ×2 IMPLANT
SUT VICRYL 4-0 PS2 18IN ABS (SUTURE) IMPLANT
SUT VICRYL RAPIDE 4/0 PS 2 (SUTURE) ×2 IMPLANT
SYR BULB 3OZ (MISCELLANEOUS) ×2 IMPLANT
SYR CONTROL 10ML LL (SYRINGE) ×1 IMPLANT
TOWEL OR 17X24 6PK STRL BLUE (TOWEL DISPOSABLE) ×2 IMPLANT
UNDERPAD 30X30 INCONTINENT (UNDERPADS AND DIAPERS) ×2 IMPLANT

## 2012-01-20 NOTE — Anesthesia Postprocedure Evaluation (Signed)
  Anesthesia Post-op Note  Patient: Gloria [name]  Procedure(s) Performed: Procedure(s) (LRB) with comments: CARPAL TUNNEL RELEASE (Right)  Patient Location: PACU  Anesthesia Type: General  Level of Consciousness: awake, alert  and oriented  Airway and Oxygen Therapy: Patient Spontanous Breathing  Post-op Pain: mild  Post-op Assessment: Post-op Vital signs reviewed  Post-op Vital Signs: Reviewed  Complications: No apparent anesthesia complications

## 2012-01-20 NOTE — Anesthesia Preprocedure Evaluation (Signed)
Anesthesia Evaluation  Patient identified by MRN, date of birth, ID band Patient awake    Reviewed: Allergy & Precautions, H&P , NPO status , Patient's Chart, lab work & pertinent test results  Airway Mallampati: I TM Distance: >3 FB Neck ROM: Full    Dental  (+) Teeth Intact and Dental Advisory Given   Pulmonary  breath sounds clear to auscultation        Cardiovascular hypertension, Pt. on medications Rhythm:Regular Rate:Normal     Neuro/Psych    GI/Hepatic   Endo/Other    Renal/GU      Musculoskeletal   Abdominal   Peds  Hematology   Anesthesia Other Findings   Reproductive/Obstetrics                           Anesthesia Physical Anesthesia Plan  ASA: II  Anesthesia Plan: General   Post-op Pain Management:    Induction: Intravenous  Airway Management Planned:   Additional Equipment:   Intra-op Plan:   Post-operative Plan: Extubation in OR  Informed Consent: I have reviewed the patients History and Physical, chart, labs and discussed the procedure including the risks, benefits and alternatives for the proposed anesthesia with the patient or authorized representative who has indicated his/her understanding and acceptance.     Plan Discussed with: CRNA, Anesthesiologist and Surgeon  Anesthesia Plan Comments:         Anesthesia Quick Evaluation

## 2012-01-20 NOTE — Op Note (Signed)
Dictated [id number]

## 2012-01-20 NOTE — Anesthesia Procedure Notes (Addendum)
Procedure Name: LMA Insertion Performed by: Lylian Sanagustin W Pre-anesthesia Checklist: Patient identified, Timeout performed, Emergency Drugs available, Suction available and Patient being monitored Patient Re-evaluated:Patient Re-evaluated prior to inductionOxygen Delivery Method: Circle system utilized Preoxygenation: Pre-oxygenation with 100% oxygen Intubation Type: IV induction Ventilation: Mask ventilation without difficulty LMA: LMA inserted LMA Size: 4.0   Performed by: Chrishelle Zito W Number of attempts: 1 Placement Confirmation: breath sounds checked- equal and bilateral and positive ETCO2 Tube secured with: Tape Dental Injury: Teeth and Oropharynx as per pre-operative assessment      

## 2012-01-20 NOTE — Brief Op Note (Signed)
01/20/2012  9:30 AM  PATIENT:  Clement Sayres  66 y.o. female  PRE-OPERATIVE DIAGNOSIS:  Right carpal tunnel syndrome  POST-OPERATIVE DIAGNOSIS:  Right carpal tunnel syndrome  PROCEDURE:  Procedure(s) (LRB) with comments: CARPAL TUNNEL RELEASE (Right)  SURGEON:  Surgeon(s) and Role:    * Nicki Reaper, MD - Primary    * Tami Ribas, MD - Assisting  PHYSICIAN ASSISTANT:   ASSISTANTS: K Riki Gehring,MD   ANESTHESIA:   general and local  EBL:  Total I/O In: 600 [I.V.:600] Out: -   BLOOD ADMINISTERED:none  DRAINS: none   LOCAL MEDICATIONS USED:  MARCAINE     SPECIMEN:  No Specimen  DISPOSITION OF SPECIMEN:  N/A  COUNTS:  YES  TOURNIQUET:   Total Tourniquet Time Documented: Forearm (Right) - 9 minutes  DICTATION: .Other Dictation: Dictation Number [id number]  PLAN OF CARE: Discharge to home after PACU  PATIENT DISPOSITION:  PACU - hemodynamically stable.

## 2012-01-20 NOTE — Transfer of Care (Signed)
Immediate Anesthesia Transfer of Care Note  Patient: Gloria [name]  Procedure(s) Performed: Procedure(s) (LRB) with comments: CARPAL TUNNEL RELEASE (Right)  Patient Location: PACU  Anesthesia Type: General  Level of Consciousness: awake and sedated  Airway & Oxygen Therapy: Patient Spontanous Breathing and Patient connected to face mask oxygen  Post-op Assessment: Report given to PACU RN and Post -op Vital signs reviewed and stable  Post vital signs: Reviewed and stable  Complications: No apparent anesthesia complications

## 2012-01-20 NOTE — H&P (Signed)
Gloria [name] is a 66 year-old right-hand dominant female who is referred by Dr. Shaune Pollack. She is complaining of the inability to bend her left middle finger since October. She recalls no history of injury .She states she is unable to make a fist that it feels as though it is popping out of joint.  She complains of constant, sharp pain with activity. Nothing seems to have helped this. She has had sts release and developed symptoms of numbness and tingling of fingers bilaterally. NCV are positive for CTS. of her carpal tunnels. The injections are wearing off on each side, right greater than left. We have discussed with her the possibility of surgical decompression She has no history of diabetes, thyroid problems, arthritis or gout. There is no family history of same.  PAST MEDICAL HISTORY:  She has no allergies.  She is on bupropion 100 mg;  Temazepam 30 mg.; pravastatin 20 mg.;  Lisinopril/HCTZ 20/25;  alprazolam .5mg ; Paroxetine 40mg . Topiramate 50mg .  FAMILY MEDICAL HISTORY:  Positive for heart disease, high blood pressure.  SOCIAL HISTORY:  She does not smoke or drink.  She is not working at the present time.    REVIEW OF SYSTEMS:   Positive for high blood pressure, pneumonia. stomach ulcer, depression, nervousness, sleep disorder, otherwise negative.  Gloria [name] is an 66 y.o. female.   Chief Complaint: CTS rt HPI: see above  Past Medical History  Diagnosis Date  . Hypertension   . Hyperlipemia   . Depression   . Anxiety   . Arthritis   . Insomnia     Past Surgical History  Procedure Date  . Tonsillectomy     age 63  . Abdominal hysterectomy     age 65  . Trigger finger release 2/12    rt middle  . Trigger finger release 2010    lt middle  . Small intestine surgery   . Small intestine surgery     has had 6 total bowel obstructions -mostlt adhesions-last 2009    History reviewed. No pertinent family history. Social History:  reports that she has never smoked. She does  not have any smokeless tobacco history on file. She reports that she does not drink alcohol or use illicit drugs.  Allergies: No Known Allergies  Medications Prior to Admission  Medication Sig Dispense Refill  . buPROPion (WELLBUTRIN) 100 MG tablet Take 100 mg by mouth 3 (three) times daily.      . cholecalciferol (VITAMIN D) 1000 UNITS tablet Take 1,000 Units by mouth daily.      Marland Kitchen lisinopril-hydrochlorothiazide (PRINZIDE,ZESTORETIC) 20-12.5 MG per tablet Take 1 tablet by mouth every evening.      Marland Kitchen PARoxetine (PAXIL) 40 MG tablet Take 40 mg by mouth every morning.      . potassium chloride (K-DUR,KLOR-CON) 10 MEQ tablet Take 10 mEq by mouth daily.      . pravastatin (PRAVACHOL) 20 MG tablet Take 20 mg by mouth every evening.      . temazepam (RESTORIL) 30 MG capsule Take 30 mg by mouth at bedtime as needed.        Results for orders placed during the hospital encounter of 01/20/12 (from the past 48 hour(s))  POCT HEMOGLOBIN-HEMACUE     Status: Abnormal   Collection Time   01/20/12  7:28 AM      Component Value Range Comment   Hemoglobin 11.9 (*) 12.0 - 15.0 g/dL     No results found.   Pertinent items are noted in  HPI.  Blood pressure 137/78, pulse 85, temperature 97.9 F (36.6 C), temperature source Oral, resp. rate 20, height 5' 6.5" (1.689 m), weight 59.875 kg (132 lb), SpO2 99.00%.  General appearance: alert, cooperative and appears stated age Head: Normocephalic, without obvious abnormality Neck: no adenopathy Resp: clear to auscultation bilaterally Cardio: regular rate and rhythm, S1, S2 normal, no murmur, click, rub or gallop GI: soft, non-tender; bowel sounds normal; no masses,  no organomegaly Extremities: extremities normal, atraumatic, no cyanosis or edema Pulses: 2+ and symmetric Skin: Skin color, texture, turgor normal. No rashes or lesions Neurologic: Grossly normal Incision/Wound: na  Assessment/Plan Dx: CTS RT We have discussed with her the possibility  of surgical decompression. The pre, peri and post op course are discussed along with risks and complications. She is aware there is no guarantee with surgery, possibility of infection, recurrence, injury to arteries, nerves and tendons, incomplete relief of symptoms and dystrophy.  We would not recommend doing them bilateral and minimum time would be 3 weeks in between. She would like to call back and have her right side scheduled. This will be scheduled for right carpal tunnel release as an outpatient under regional anesthesia.  Tomara Youngberg R 01/20/2012, 8:43 AM

## 2012-01-21 ENCOUNTER — Encounter (HOSPITAL_BASED_OUTPATIENT_CLINIC_OR_DEPARTMENT_OTHER): Payer: Self-pay | Admitting: Orthopedic Surgery

## 2012-01-21 NOTE — Op Note (Signed)
NAMETAQWA, DEEM NO.:  [id number]  MEDICAL RECORD NO.:  [id number]  LOCATION:                                 FACILITY:  PHYSICIAN:  Cindee Salt, M.D.            DATE OF BIRTH:  DATE OF PROCEDURE:  01/20/2012 DATE OF DISCHARGE:                              OPERATIVE REPORT   PREOPERATIVE DIAGNOSIS:  Carpal tunnel syndrome, right hand.  POSTOPERATIVE:  Carpal tunnel syndrome, right hand.  OPERATION:  Decompression of right median nerve.  SURGEON:  Cindee Salt, M.D.  ASSISTANT:  Betha Loa, MD  ANESTHESIA:  General with local infiltration.  ANESTHESIOLOGIST:  Sheldon Silvan, M.D.  HISTORY:  The patient is a 66 year old female with a history of carpal tunnel syndrome.  EMG nerve conduction is positive, this is not responded to conservative treatment.  She has elected to undergo surgical decompression.  Pre, peri, and postoperative course had been discussed along with risks and complications.  She is aware that there is no guarantee with the surgery; possibility of infection; recurrence of injury to arteries, nerves, tendons; incomplete relief of symptoms and dystrophy.  In the preoperative area, the patient is seen, the extremity marked by both the patient and surgeon, and antibiotic given.  PROCEDURE:  The patient was brought to the operating room where a general anesthetic was carried out without difficulty.  She was prepped using ChloraPrep, supine position with the right arm free.  A 3-minute dry time was allowed.  Time-out taken, confirming the patient and procedure.  A longitudinal incision was made in the palm, carried down through the subcutaneous tissue.  Bleeders were electrocauterized. Palmar fascia was split.  Superficial palmar arch was identified.  The flexor tendon of the ring and little finger identified to the ulnar side of the median nerve.  The carpal retinaculum was incised with sharp dissection.  A right angle and Sewall retractor  were placed between the skin and forearm fascia.  The fascia was released for approximately 1.5 cm proximal to the wrist crease under direct vision.  Canal was explored.  Area of compression to the nerve was apparent.  Motor branch was noted and entered into muscle.  The wound was irrigated with saline, closed with interrupted 4-0 Vicryl Rapide sutures.  Local infiltration with 0.25% Marcaine without epinephrine was given, 5 mL was used. Compressive dressing was applied with the fingers free.  On deflation of the tourniquet, all fingers were immediately pinked.  She was taken to the recovery room for observation in satisfactory condition.  She will be discharged home to return in 1 week, on [place] NX.          ______________________________ Cindee Salt, M.D.     GK/MEDQ  D:  01/20/2012  T:  01/21/2012  Job:  [id number]

## 2012-02-20 NOTE — Progress Notes (Signed)
Was here for rt ctr9/13-did well To come in for bmet-had ekg 9/13

## 2012-02-23 ENCOUNTER — Other Ambulatory Visit: Payer: Self-pay | Admitting: Orthopedic Surgery

## 2012-02-23 ENCOUNTER — Encounter (HOSPITAL_BASED_OUTPATIENT_CLINIC_OR_DEPARTMENT_OTHER)
Admission: RE | Admit: 2012-02-23 | Discharge: 2012-02-23 | Disposition: A | Discharge status: Home / Self Care | Payer: Medicare Other | Source: Ambulatory Visit | Attending: Orthopedic Surgery | Admitting: Orthopedic Surgery

## 2012-02-23 LAB — BASIC METABOLIC PANEL
BUN: 21 mg/dL (ref 6–23)
Chloride: 103 mEq/L (ref 96–112)
Creatinine, Ser: 0.84 mg/dL (ref 0.50–1.10)
GFR calc Af Amer: 82 mL/min — ABNORMAL LOW (ref >90)
Glucose, Bld: 84 mg/dL (ref 70–99)

## 2012-02-24 ENCOUNTER — Other Ambulatory Visit: Payer: Self-pay | Admitting: Orthopedic Surgery

## 2012-02-25 ENCOUNTER — Ambulatory Visit (HOSPITAL_BASED_OUTPATIENT_CLINIC_OR_DEPARTMENT_OTHER)
Admission: RE | Admit: 2012-02-25 | Discharge: 2012-02-25 | Disposition: A | Discharge status: Home / Self Care | Payer: Medicare Other | Source: Ambulatory Visit | Attending: Orthopedic Surgery | Admitting: Orthopedic Surgery

## 2012-02-25 ENCOUNTER — Encounter (HOSPITAL_BASED_OUTPATIENT_CLINIC_OR_DEPARTMENT_OTHER)
Admission: RE | Disposition: A | Discharge status: Home / Self Care | Payer: Self-pay | Source: Ambulatory Visit | Attending: Orthopedic Surgery

## 2012-02-25 ENCOUNTER — Encounter (HOSPITAL_BASED_OUTPATIENT_CLINIC_OR_DEPARTMENT_OTHER): Payer: Self-pay | Admitting: Orthopedic Surgery

## 2012-02-25 ENCOUNTER — Encounter (HOSPITAL_BASED_OUTPATIENT_CLINIC_OR_DEPARTMENT_OTHER): Payer: Self-pay | Admitting: Anesthesiology

## 2012-02-25 ENCOUNTER — Encounter (HOSPITAL_BASED_OUTPATIENT_CLINIC_OR_DEPARTMENT_OTHER): Payer: Self-pay | Admitting: *Deleted

## 2012-02-25 ENCOUNTER — Ambulatory Visit (HOSPITAL_BASED_OUTPATIENT_CLINIC_OR_DEPARTMENT_OTHER): Payer: Medicare Other | Admitting: Anesthesiology

## 2012-02-25 DIAGNOSIS — Z01812 Encounter for preprocedural laboratory examination: Secondary | ICD-10-CM | POA: Insufficient documentation

## 2012-02-25 DIAGNOSIS — F3289 Other specified depressive episodes: Secondary | ICD-10-CM | POA: Insufficient documentation

## 2012-02-25 DIAGNOSIS — G4700 Insomnia, unspecified: Secondary | ICD-10-CM | POA: Insufficient documentation

## 2012-02-25 DIAGNOSIS — G5600 Carpal tunnel syndrome, unspecified upper limb: Secondary | ICD-10-CM | POA: Insufficient documentation

## 2012-02-25 DIAGNOSIS — E785 Hyperlipidemia, unspecified: Secondary | ICD-10-CM | POA: Insufficient documentation

## 2012-02-25 DIAGNOSIS — I10 Essential (primary) hypertension: Secondary | ICD-10-CM | POA: Insufficient documentation

## 2012-02-25 DIAGNOSIS — F329 Major depressive disorder, single episode, unspecified: Secondary | ICD-10-CM | POA: Insufficient documentation

## 2012-02-25 DIAGNOSIS — F411 Generalized anxiety disorder: Secondary | ICD-10-CM | POA: Insufficient documentation

## 2012-02-25 DIAGNOSIS — M65839 Other synovitis and tenosynovitis, unspecified forearm: Secondary | ICD-10-CM | POA: Insufficient documentation

## 2012-02-25 HISTORY — PX: CARPAL TUNNEL RELEASE: SHX101

## 2012-02-25 SURGERY — CARPAL TUNNEL RELEASE
Anesthesia: Regional | Site: Hand | Laterality: Left | Wound class: Clean

## 2012-02-25 MED ORDER — FENTANYL CITRATE 0.05 MG/ML IJ SOLN: 25.0000 ug | INTRAMUSCULAR | Status: DC | PRN

## 2012-02-25 MED ORDER — CHLORHEXIDINE GLUCONATE 4 % EX LIQD: 60.0000 mL | Freq: Once | CUTANEOUS | Status: DC

## 2012-02-25 MED ORDER — BUPIVACAINE HCL (PF) 0.25 % IJ SOLN
INTRAMUSCULAR | Status: DC | PRN
  Administered 2012-02-25: 10 mL

## 2012-02-25 MED ORDER — FENTANYL CITRATE 0.05 MG/ML IJ SOLN
INTRAMUSCULAR | Status: DC | PRN
  Administered 2012-02-25: 25 ug via INTRAVENOUS
  Administered 2012-02-25: 50 ug via INTRAVENOUS

## 2012-02-25 MED ORDER — LABETALOL HCL 5 MG/ML IV SOLN
10.0000 mg | Freq: Once | INTRAVENOUS | Status: AC
  Administered 2012-02-25: 10 mg via INTRAVENOUS

## 2012-02-25 MED ORDER — ONDANSETRON HCL 4 MG/2ML IJ SOLN: 4.0000 mg | Freq: Four times a day (QID) | INTRAMUSCULAR | Status: DC | PRN

## 2012-02-25 MED ORDER — MIDAZOLAM HCL 5 MG/5ML IJ SOLN
INTRAMUSCULAR | Status: DC | PRN
  Administered 2012-02-25 (×2): 0.5 mg via INTRAVENOUS

## 2012-02-25 MED ORDER — LIDOCAINE HCL (PF) 0.5 % IJ SOLN: INTRAMUSCULAR | Status: DC | PRN

## 2012-02-25 MED ORDER — LIDOCAINE HCL (CARDIAC) 20 MG/ML IV SOLN
INTRAVENOUS | Status: DC | PRN
  Administered 2012-02-25: 25 mg via INTRAVENOUS

## 2012-02-25 MED ORDER — MIDAZOLAM HCL 2 MG/2ML IJ SOLN
0.5000 mg | Freq: Once | INTRAMUSCULAR | Status: AC | PRN
  Administered 2012-02-25: 1 mg via INTRAVENOUS

## 2012-02-25 MED ORDER — OXYCODONE HCL 5 MG PO TABS
5.0000 mg | ORAL_TABLET | Freq: Once | ORAL | Status: AC | PRN
  Administered 2012-02-25: 5 mg via ORAL

## 2012-02-25 MED ORDER — ONDANSETRON HCL 4 MG/2ML IJ SOLN
INTRAMUSCULAR | Status: DC | PRN
  Administered 2012-02-25: 4 mg via INTRAVENOUS

## 2012-02-25 MED ORDER — OXYCODONE HCL 5 MG/5ML PO SOLN: 5.0000 mg | Freq: Once | ORAL | Status: AC | PRN

## 2012-02-25 MED ORDER — MIDAZOLAM HCL 2 MG/2ML IJ SOLN
0.5000 mg | Freq: Once | INTRAMUSCULAR | Status: AC | PRN
  Administered 2012-02-25: 2 mg via INTRAVENOUS

## 2012-02-25 MED ORDER — HYDROCODONE-ACETAMINOPHEN 5-500 MG PO TABS: 1.0000 | ORAL_TABLET | ORAL | Status: AC | PRN

## 2012-02-25 MED ORDER — LACTATED RINGERS IV SOLN
INTRAVENOUS | Status: DC
  Administered 2012-02-25 (×3): via INTRAVENOUS

## 2012-02-25 MED ORDER — MEPERIDINE HCL 25 MG/ML IJ SOLN
6.2500 mg | INTRAMUSCULAR | Status: DC | PRN
  Administered 2012-02-25: 12.5 mg via INTRAVENOUS

## 2012-02-25 MED ORDER — CEFAZOLIN SODIUM-DEXTROSE 2-3 GM-% IV SOLR
2.0000 g | INTRAVENOUS | Status: AC
  Administered 2012-02-25: 2 g via INTRAVENOUS

## 2012-02-25 MED ORDER — PROPOFOL 10 MG/ML IV EMUL
INTRAVENOUS | Status: DC | PRN
  Administered 2012-02-25: 100 ug/kg/min via INTRAVENOUS

## 2012-02-25 SURGICAL SUPPLY — 37 items
BANDAGE GAUZE ELAST BULKY 4 IN (GAUZE/BANDAGES/DRESSINGS) ×2 IMPLANT
BLADE SURG 15 STRL LF DISP TIS (BLADE) ×1 IMPLANT
BLADE SURG 15 STRL SS (BLADE) ×2
BNDG CMPR 9X4 STRL LF SNTH (GAUZE/BANDAGES/DRESSINGS)
BNDG COHESIVE 3X5 TAN STRL LF (GAUZE/BANDAGES/DRESSINGS) ×2 IMPLANT
BNDG ESMARK 4X9 LF (GAUZE/BANDAGES/DRESSINGS) IMPLANT
CHLORAPREP W/TINT 26ML (MISCELLANEOUS) ×2 IMPLANT
CLOTH BEACON ORANGE TIMEOUT ST (SAFETY) ×2 IMPLANT
CORDS BIPOLAR (ELECTRODE) ×2 IMPLANT
COVER MAYO STAND STRL (DRAPES) ×2 IMPLANT
COVER TABLE BACK 60X90 (DRAPES) ×2 IMPLANT
CUFF TOURNIQUET SINGLE 18IN (TOURNIQUET CUFF) ×2 IMPLANT
DRAPE EXTREMITY T 121X128X90 (DRAPE) ×2 IMPLANT
DRAPE SURG 17X23 STRL (DRAPES) ×2 IMPLANT
DRSG KUZMA FLUFF (GAUZE/BANDAGES/DRESSINGS) ×2 IMPLANT
GAUZE XEROFORM 1X8 LF (GAUZE/BANDAGES/DRESSINGS) ×2 IMPLANT
GLOVE BIO SURGEON STRL SZ 6.5 (GLOVE) ×1 IMPLANT
GLOVE BIOGEL PI IND STRL 8.5 (GLOVE) ×1 IMPLANT
GLOVE BIOGEL PI INDICATOR 8.5 (GLOVE) ×1
GLOVE SURG ORTHO 8.0 STRL STRW (GLOVE) ×2 IMPLANT
GOWN BRE IMP PREV XXLGXLNG (GOWN DISPOSABLE) ×2 IMPLANT
GOWN PREVENTION PLUS XLARGE (GOWN DISPOSABLE) ×2 IMPLANT
NEEDLE 27GAX1X1/2 (NEEDLE) IMPLANT
NS IRRIG 1000ML POUR BTL (IV SOLUTION) ×2 IMPLANT
PACK BASIN DAY SURGERY FS (CUSTOM PROCEDURE TRAY) ×2 IMPLANT
PAD CAST 3X4 CTTN HI CHSV (CAST SUPPLIES) ×1 IMPLANT
PADDING CAST ABS 4INX4YD NS (CAST SUPPLIES)
PADDING CAST ABS COTTON 4X4 ST (CAST SUPPLIES) ×1 IMPLANT
PADDING CAST COTTON 3X4 STRL (CAST SUPPLIES) ×2
SPONGE GAUZE 4X4 12PLY (GAUZE/BANDAGES/DRESSINGS) ×2 IMPLANT
STOCKINETTE 4X48 STRL (DRAPES) ×2 IMPLANT
SUT VICRYL 4-0 PS2 18IN ABS (SUTURE) IMPLANT
SUT VICRYL RAPIDE 4/0 PS 2 (SUTURE) ×2 IMPLANT
SYR BULB 3OZ (MISCELLANEOUS) ×2 IMPLANT
SYR CONTROL 10ML LL (SYRINGE) IMPLANT
TOWEL OR 17X24 6PK STRL BLUE (TOWEL DISPOSABLE) ×2 IMPLANT
UNDERPAD 30X30 INCONTINENT (UNDERPADS AND DIAPERS) ×2 IMPLANT

## 2012-02-25 NOTE — Progress Notes (Signed)
Dr Chaney Malling in to reassess pt. VSS. Oriented x 4. Tremor bil arms and hands mild, intermittent. Sitting up in bed drinking flds and talking w/ sister. Dr. Alonna Buckler discussed w/ pt and sister will transfer to stage 2 discharge area, monitor for period of time then reevaluate for possible d/c to home.

## 2012-02-25 NOTE — Op Note (Signed)
Dictated number: [id number]

## 2012-02-25 NOTE — Transfer of Care (Signed)
Immediate Anesthesia Transfer of Care Note  Patient: Gloria [name]  Procedure(s) Performed: Procedure(s) (LRB) with comments: CARPAL TUNNEL RELEASE (Left) - RELEASE A-1 PULLEY LRF  Patient Location: PACU  Anesthesia Type: Bier block  Level of Consciousness: awake, oriented and sedated  Airway & Oxygen Therapy: Patient Spontanous Breathing and Patient connected to face mask oxygen  Post-op Assessment: Report given to PACU RN, Post -op Vital signs reviewed and stable and Patient moving all extremities  Post vital signs: Reviewed and stable  Complications: No apparent anesthesia complications

## 2012-02-25 NOTE — Progress Notes (Signed)
Pt started with mild tremors bil hands and arms. No other movement. Alert states "can't stop". Dr. Chaney Malling in to assess.

## 2012-02-25 NOTE — Brief Op Note (Signed)
02/25/2012  9:22 AM  PATIENT:  Clement Sayres  66 y.o. female  PRE-OPERATIVE DIAGNOSIS:  CTS LEFT SIDE/STS LEFT RING FINGER  POST-OPERATIVE DIAGNOSIS:  CTS LEFT SIDE/STS LEFT RING FINGER  PROCEDURE:  Procedure(s) (LRB) with comments: CARPAL TUNNEL RELEASE (Left) - RELEASE A-1 PULLEY LRF  SURGEON:  Surgeon(s) and Role:    * Nicki Reaper, MD - Primary  PHYSICIAN ASSISTANT:   ASSISTANTS: none   ANESTHESIA:   local and regional  EBL:  Total I/O In: 200 [I.V.:200] Out: -   BLOOD ADMINISTERED:none  DRAINS: none   LOCAL MEDICATIONS USED:  MARCAINE     SPECIMEN:  No Specimen  DISPOSITION OF SPECIMEN:  N/A  COUNTS:  YES  TOURNIQUET:   Total Tourniquet Time Documented: Upper Arm (Left) - 25 minutes  DICTATION: .Other Dictation: Dictation Number [id number]  PLAN OF CARE: Discharge to home after PACU  PATIENT DISPOSITION:  PACU - hemodynamically stable.

## 2012-02-25 NOTE — Anesthesia Postprocedure Evaluation (Signed)
Anesthesia Post Note  Patient: Gloria [name]  Procedure(s) Performed: Procedure(s) (LRB): CARPAL TUNNEL RELEASE (Left)  Anesthesia type: MAC  Patient location: PACU  Post pain: Pain level controlled and Adequate analgesia  Post assessment: Post-op Vital signs reviewed, Patient's Cardiovascular Status Stable and Respiratory Function Stable  Last Vitals:  Filed Vitals:   02/25/12 0930  BP: 142/78  Pulse: 91  Temp:   Resp: 14    Post vital signs: Reviewed and stable  Level of consciousness: awake, alert  and oriented  Complications: No apparent anesthesia complications

## 2012-02-25 NOTE — Anesthesia Procedure Notes (Addendum)
Procedure Name: MAC Date/Time: 02/25/2012 8:43 AM Performed by: Meyer Russel Pre-anesthesia Checklist: Patient identified, Emergency Drugs available, Suction available and Patient being monitored Patient Re-evaluated:Patient Re-evaluated prior to inductionOxygen Delivery Method: Simple face mask Preoxygenation: Pre-oxygenation with 100% oxygen   Anesthesia Regional Block:  Bier block (IV Regional)  Pre-Anesthetic Checklist: ,, timeout performed, Correct Patient, Correct Site, Correct Laterality, Correct Procedure,, site marked, surgical consent,, at surgeon's request  Laterality: Left and Lower     Needles:  Injection technique: Single-shot  Needle Type: Other      Needle Gauge: 22 and 22 G    Additional Needles: Bier block (IV Regional) Narrative:  Injection made incrementally with aspirations every 30 mL.  Performed by: Personally   Bier block (IV Regional)

## 2012-02-25 NOTE — Anesthesia Preprocedure Evaluation (Addendum)
Anesthesia Evaluation  Patient identified by MRN, date of birth, ID band Patient awake    Reviewed: Allergy & Precautions, H&P , NPO status , Patient's Chart, lab work & pertinent test results  Airway Mallampati: II  Neck ROM: full    Dental   Pulmonary          Cardiovascular hypertension,     Neuro/Psych Anxiety Depression    GI/Hepatic   Endo/Other    Renal/GU      Musculoskeletal  (+) Arthritis -,   Abdominal   Peds  Hematology   Anesthesia Other Findings   Reproductive/Obstetrics                           Anesthesia Physical Anesthesia Plan  ASA: II  Anesthesia Plan: Bier Block   Post-op Pain Management:    Induction: Intravenous  Airway Management Planned: Simple Face Mask  Additional Equipment:   Intra-op Plan:   Post-operative Plan:   Informed Consent: I have reviewed the patients History and Physical, chart, labs and discussed the procedure including the risks, benefits and alternatives for the proposed anesthesia with the patient or authorized representative who has indicated his/her understanding and acceptance.     Plan Discussed with: CRNA and Surgeon  Anesthesia Plan Comments:         Anesthesia Quick Evaluation

## 2012-02-25 NOTE — H&P (Signed)
Gloria [name] is a 66 year-old right-hand dominant female who is referred by Dr. Shaune Pollack. She is complaining of the inability to bend her left middle finger since October. She recalls no history of injury .She states she is unable to make a fist that it feels as though it is popping out of joint.  She complains of constant, sharp pain with activity. Nothing seems to have helped this.  She has no history of diabetes, thyroid problems, arthritis or gout. There is no family history of same.  PAST MEDICAL HISTORY:  She has no allergies.  She is on paroxetine 40 mg;  desipramine 50 mg.; pravastatin 20 mg.;  Lisinopril/HCTZ 20/25;  alprazolam 25;  zolpidem 10 mg.; doxyzol 50 mg.; Maxalt 10 mg.  FAMILY MEDICAL HISTORY:  Positive for heart disease, high blood pressure.  SOCIAL HISTORY:  She does not smoke or drink.  She is not working at the present time.    REVIEW OF SYSTEMS:   Positive for high blood pressure, pneumonia. stomach ulcer, depression, nervousness, sleep disorder, otherwise negative.  Gloria [name] is an 66 y.o. female.   Chief Complaint: CTS lt STS LRF HPI: see above  Past Medical History  Diagnosis Date  . Hypertension   . Hyperlipemia   . Depression   . Anxiety   . Arthritis   . Insomnia     Past Surgical History  Procedure Date  . Tonsillectomy     age 83  . Abdominal hysterectomy     age 63  . Trigger finger release 2/12    rt middle  . Trigger finger release 2010    lt middle  . Small intestine surgery   . Small intestine surgery     has had 6 total bowel obstructions -mostlt adhesions-last 2009  . Carpal tunnel release 01/20/2012    Procedure: CARPAL TUNNEL RELEASE;  Surgeon: Nicki Reaper, MD;  Location: [facility];  Service: Orthopedics;  Laterality: Right;    History reviewed. No pertinent family history. Social History:  reports that she has never smoked. She does not have any smokeless tobacco history on file. She reports that she does not  drink alcohol or use illicit drugs.  Allergies: No Known Allergies  Medications Prior to Admission  Medication Sig Dispense Refill  . buPROPion (WELLBUTRIN) 100 MG tablet Take 100 mg by mouth 3 (three) times daily.      . cholecalciferol (VITAMIN D) 1000 UNITS tablet Take 1,000 Units by mouth daily.      Marland Kitchen lisinopril-hydrochlorothiazide (PRINZIDE,ZESTORETIC) 20-12.5 MG per tablet Take 1 tablet by mouth every evening.      Marland Kitchen PARoxetine (PAXIL) 40 MG tablet Take 40 mg by mouth every morning.      . potassium chloride (K-DUR,KLOR-CON) 10 MEQ tablet Take 10 mEq by mouth daily.      . pravastatin (PRAVACHOL) 20 MG tablet Take 20 mg by mouth every evening.      . temazepam (RESTORIL) 30 MG capsule Take 30 mg by mouth at bedtime as needed.      . pentazocine-naloxone (TALWIN NX) 50-0.5 MG per tablet Take 1 tablet by mouth every 4 (four) hours as needed for pain.  30 tablet  0    Results for orders placed during the hospital encounter of 02/25/12 (from the past 48 hour(s))  BASIC METABOLIC PANEL     Status: Abnormal   Collection Time   02/23/12 12:00 PM      Component Value Range Comment  Sodium 139  135 - 145 mEq/L    Potassium 4.6  3.5 - 5.1 mEq/L    Chloride 103  96 - 112 mEq/L    CO2 30  19 - 32 mEq/L    Glucose, Bld 84  70 - 99 mg/dL    BUN 21  6 - 23 mg/dL    Creatinine, Ser 1.61  0.50 - 1.10 mg/dL    Calcium 9.6  8.4 - 09.6 mg/dL    GFR calc non Af Amer 71 (*) >90 mL/min    GFR calc Af Amer 82 (*) >90 mL/min     No results found.   Pertinent items are noted in HPI.  Blood pressure 142/88, pulse 93, temperature 98.3 F (36.8 C), temperature source Oral, resp. rate 18, height 5' 6.5" (1.689 m), weight 59.875 kg (132 lb), SpO2 100.00%.  General appearance: alert, cooperative and appears stated age Head: Normocephalic, without obvious abnormality Neck: no adenopathy Resp: clear to auscultation bilaterally Cardio: regular rate and rhythm, S1, S2 normal, no murmur, click, rub  or gallop GI: soft, non-tender; bowel sounds normal; no masses,  no organomegaly Extremities: extremities normal, atraumatic, no cyanosis or edema Pulses: 2+ and symmetric Skin: Skin color, texture, turgor normal. No rashes or lesions Neurologic: Grossly normal Incision/Wound: na  Assessment/Plan RADIOGRAPHS:   X-rays are negative.  DIAGNOSIS: : She is scheduled for left carpal tunnel release as an outpatient. She would like to have the trigger finger left ring added to her permit in that this has continued to cause her some discomfort. She will be scheduled for left carpal tunnel release along with trigger finger release ring finger.  Gloria [name] R 02/25/2012, 8:32 AM

## 2012-02-25 NOTE — Op Note (Signed)
NAMELAELYNN, DESCHAINE NO.:  [id number]  MEDICAL RECORD NO.:  [id number]  LOCATION:                                 FACILITY:  PHYSICIAN:  Cindee Salt, M.D.            DATE OF BIRTH:  DATE OF PROCEDURE:  02/25/2012 DATE OF DISCHARGE:                              OPERATIVE REPORT   PREOPERATIVE DIAGNOSES:  Carpal tunnel syndrome, right hand.  Stenosing tenosynovitis, right ring finger.  POSTOPERATIVE DIAGNOSIS:  Left carpal tunnel release, left ring finger.  SURGEON:  Cindee Salt, M.D.  ANESTHESIA:  Forearm-based IV regional.  ANESTHESIOLOGIST:  Achille Rich, MD.  HISTORY:  The patient is a 66 year old female with a history of carpal tunnel syndrome.  EMG nerve conduction is positive.  She has undergone conservative treatments for left side with injections without relief of symptoms.  She is desirous of proceeding to have the carpal tunnel release along with the left ring finger.  Pre, peri and postoperative course have been discussed along with risks and complications.  She is aware there is no guarantee with the surgery; possibility of infection; recurrence of injury to arteries, nerves, tendons; incomplete relief of symptoms; dystrophy.  In the preoperative area, the patient is seen, the extremity marked by both the patient and surgeon, and antibiotic given.  PROCEDURE:  The patient was brought to the operating room where a forearm-based IV regional anesthetic was carried out without difficulty. Left arm free.  A 3-minute dry time was allowed.  Time-out taken, confirming the patient and procedure.  An oblique incision was made over the A1 pulley of the left ring finger carried down through the subcutaneous tissue.  Bleeders were electrocauterized with bipolar.  A flexor sheath cyst was present, this was broken.  An incision was then made on the radial aspect of the A1 pulley.  A small incision was made centrally in A2.  Partial synovectomy was performed  proximally.  Finger was placed through a full range motion, no further triggering was noted. The wound was irrigated.  The skin was closed with interrupted 4-0 Vicryl Rapide sutures.  Local infiltration with 0.25% Marcaine without epinephrine was given, approximately 3 mL was used.  Separate incision was then made longitudinally in the palm, left side, carried down through the subcutaneous tissue.  Bleeders were again electrocauterized. Palmar fascia was split, superficial palmar arch identified.  The flexor tendon of the ring and little finger identified to the ulnar side of the median nerve.  The carpal retinaculum was incised with sharp dissection. Right angle and Sewall retractor were placed between the skin and forearm fascia.  The fascia was released for approximately 1.5 cm proximal to the wrist crease under direct vision.  Canal was explored. Air compression to the nerve was apparent.  No further lesions were identified.  The wound was irrigated.  The skin was closed with interrupted 4-0 Vicryl Rapide sutures.  Local infiltration with 0.25% Marcaine without epinephrine was given, approximately 8 mL was used. The wound was closed with interrupted 4-0 Vicryl Rapide.  A sterile compressive dressing with fingers free was applied.  On deflation of the tourniquet, all fingers  were immediately pinked.  She was taken to the recovery room for observation in satisfactory condition.  She will be discharged to home, return to the [facility] in 1 week, on Vicodin.          ______________________________ Cindee Salt, M.D.     GK/MEDQ  D:  02/25/2012  T:  02/25/2012  Job:  [id number]

## 2012-02-25 NOTE — Progress Notes (Signed)
Pt sitting in chair no tremors noted. Ambulated to BR gait steady. Tol flds well. Dr. Chaney Malling in. Discussed w/ pt and sister unsure about cause of tremors, monitor seek medical attention of return of symptoms. Verbalized understanding.

## 2012-02-26 ENCOUNTER — Encounter (HOSPITAL_BASED_OUTPATIENT_CLINIC_OR_DEPARTMENT_OTHER): Payer: Self-pay | Admitting: Orthopedic Surgery

## 2012-06-19 ENCOUNTER — Other Ambulatory Visit: Payer: Self-pay

## 2012-06-23 DIAGNOSIS — Z961 Presence of intraocular lens: Secondary | ICD-10-CM | POA: Insufficient documentation

## 2012-08-10 ENCOUNTER — Encounter (INDEPENDENT_AMBULATORY_CARE_PROVIDER_SITE_OTHER): Payer: Self-pay | Admitting: General Surgery

## 2012-08-12 ENCOUNTER — Ambulatory Visit (INDEPENDENT_AMBULATORY_CARE_PROVIDER_SITE_OTHER): Payer: Medicare Other | Admitting: General Surgery

## 2012-08-12 ENCOUNTER — Encounter (INDEPENDENT_AMBULATORY_CARE_PROVIDER_SITE_OTHER): Payer: Self-pay | Admitting: General Surgery

## 2012-08-12 VITALS — BP 122/66 | HR 98 | Resp 18 | Ht 66.5 in | Wt 135.2 lb

## 2012-08-12 DIAGNOSIS — K649 Unspecified hemorrhoids: Secondary | ICD-10-CM

## 2012-08-12 MED ORDER — LIDOCAINE HCL 2 % EX GEL: CUTANEOUS | Status: AC

## 2012-08-12 NOTE — Progress Notes (Signed)
Patient ID: Gloria [name], female   DOB: 02-02-1946, 67 y.o.   MRN: [id number]  No chief complaint on file.   HPI Gloria [name] is a 67 y.o. female.  The patient is a 67 year old female who was referred by Dr. Rulon [name] for evaluation of hemorrhoids. The patient states she's had multiple hemorrhoid issues over the last several years. Mainly complains of pain, there's been no blood bowel movements are wiping. With a colonoscopy several years ago which showed only minimal hemorrhoids. The patient states she currently takes MiraLax every day secondary to previous bowel obstructions which required operations. She does feel that time she still is constipated and has to strain.  HPI  Past Medical History  Diagnosis Date  . Hypertension   . Hyperlipemia   . Depression   . Anxiety   . Arthritis   . Insomnia   . CKD (chronic kidney disease), stage III     Past Surgical History  Procedure Laterality Date  . Tonsillectomy      age 48  . Abdominal hysterectomy      age 37  . Trigger finger release  2/12    rt middle  . Trigger finger release  2010    lt middle  . Small intestine surgery    . Small intestine surgery      has had 6 total bowel obstructions -mostlt adhesions-last 2009  . Carpal tunnel release  01/20/2012    Procedure: CARPAL TUNNEL RELEASE;  Surgeon: Gloria Reaper, MD;  Location: [facility];  Service: Orthopedics;  Laterality: Right;  . Carpal tunnel release  02/25/2012    Procedure: CARPAL TUNNEL RELEASE;  Surgeon: Gloria Reaper, MD;  Location: [facility];  Service: Orthopedics;  Laterality: Left;  RELEASE A-1 PULLEY LRF    No family history on file.  Social History History  Substance Use Topics  . Smoking status: Never Smoker   . Smokeless tobacco: Not on file  . Alcohol Use: No    No Known Allergies  Current Outpatient Prescriptions  Medication Sig Dispense Refill  . aspirin 81 MG tablet Take 81 mg by mouth daily.      . cholecalciferol  (VITAMIN D) 1000 UNITS tablet Take 1,000 Units by mouth daily.      Marland Kitchen conjugated estrogens (PREMARIN) vaginal cream Place 1 g vaginally 2 (two) times a week.      Marland Kitchen lisinopril-hydrochlorothiazide (PRINZIDE,ZESTORETIC) 20-12.5 MG per tablet Take 1 tablet by mouth every evening.      . polyethylene glycol (MIRALAX / GLYCOLAX) packet Take 17 g by mouth daily.      . potassium chloride (K-DUR,KLOR-CON) 10 MEQ tablet Take 10 mEq by mouth daily.      . pravastatin (PRAVACHOL) 20 MG tablet Take 20 mg by mouth every evening.      . temazepam (RESTORIL) 30 MG capsule Take 30 mg by mouth at bedtime as needed.       No current facility-administered medications for this visit.    Review of Systems Review of Systems  Constitutional: Negative.   HENT: Negative.   Respiratory: Negative.   Cardiovascular: Negative.   Gastrointestinal: Negative.  Negative for anal bleeding.  Genitourinary: Negative.   Neurological: Negative.   All other systems reviewed and are negative.    Blood pressure 122/66, pulse 98, resp. rate 18, height 5' 6.5" (1.689 m), weight 135 lb 3.2 oz (61.326 kg).  Physical Exam Physical Exam  Constitutional: She is oriented to person, place, and  time. She appears well-developed and well-nourished.  HENT:  Head: Normocephalic and atraumatic.  Eyes: Conjunctivae and EOM are normal. Pupils are equal, round, and reactive to light.  Neck: Normal range of motion. Neck supple.  Cardiovascular: Normal rate, regular rhythm and normal heart sounds.   Pulmonary/Chest: Effort normal and breath sounds normal.  Abdominal: Bowel sounds are normal.  Genitourinary:     Musculoskeletal: Normal range of motion.  Neurological: She is alert and oriented to person, place, and time.    Data Reviewed none  Assessment    67 year old female with an external hemorrhoid, nonthrombosed     Plan    1. When long discussion about the anatomy and physiology which leads to hemorrhoids. I agree  with continuing with MiraLax at this time. We discussed adding fiber, Metamucil, psyllium fiber to her diet. Attending at this time proceed with nonoperative management of her external hemorrhoids is the proper management, the patient agrees at this point to proceed with nonoperative management. I will give her a prescription for lidocaine gel. We'll have the patient follow back in 3-4 months.       Gloria [name]., Gloria [name] 08/12/2012, 10:06 AM

## 2012-10-22 ENCOUNTER — Ambulatory Visit
Admission: RE | Admit: 2012-10-22 | Discharge: 2012-10-22 | Disposition: A | Discharge status: Home / Self Care | Payer: Medicare Other | Source: Ambulatory Visit | Attending: Family Medicine | Admitting: Family Medicine

## 2012-10-22 ENCOUNTER — Other Ambulatory Visit: Payer: Self-pay | Admitting: Family Medicine

## 2012-10-22 DIAGNOSIS — Z01818 Encounter for other preprocedural examination: Secondary | ICD-10-CM

## 2012-11-15 ENCOUNTER — Emergency Department (HOSPITAL_COMMUNITY)
Admission: EM | Admit: 2012-11-15 | Discharge: 2012-11-15 | Disposition: A | Discharge status: Home / Self Care | Payer: Medicare Other | Attending: Emergency Medicine | Admitting: Emergency Medicine

## 2012-11-15 ENCOUNTER — Encounter (HOSPITAL_COMMUNITY): Payer: Self-pay | Admitting: Nurse Practitioner

## 2012-11-15 DIAGNOSIS — M1990 Unspecified osteoarthritis, unspecified site: Secondary | ICD-10-CM | POA: Insufficient documentation

## 2012-11-15 DIAGNOSIS — Z79899 Other long term (current) drug therapy: Secondary | ICD-10-CM | POA: Insufficient documentation

## 2012-11-15 DIAGNOSIS — Z8659 Personal history of other mental and behavioral disorders: Secondary | ICD-10-CM | POA: Insufficient documentation

## 2012-11-15 DIAGNOSIS — E785 Hyperlipidemia, unspecified: Secondary | ICD-10-CM | POA: Insufficient documentation

## 2012-11-15 DIAGNOSIS — G8918 Other acute postprocedural pain: Secondary | ICD-10-CM | POA: Insufficient documentation

## 2012-11-15 DIAGNOSIS — Z7982 Long term (current) use of aspirin: Secondary | ICD-10-CM | POA: Insufficient documentation

## 2012-11-15 DIAGNOSIS — N183 Chronic kidney disease, stage 3 unspecified: Secondary | ICD-10-CM | POA: Insufficient documentation

## 2012-11-15 DIAGNOSIS — I129 Hypertensive chronic kidney disease with stage 1 through stage 4 chronic kidney disease, or unspecified chronic kidney disease: Secondary | ICD-10-CM | POA: Insufficient documentation

## 2012-11-15 DIAGNOSIS — G4700 Insomnia, unspecified: Secondary | ICD-10-CM | POA: Insufficient documentation

## 2012-11-15 MED ORDER — DIAZEPAM 5 MG/ML IJ SOLN
5.0000 mg | Freq: Once | INTRAMUSCULAR | Status: AC
  Administered 2012-11-15: 5 mg via INTRAVENOUS
  Filled 2012-11-15: qty 2

## 2012-11-15 MED ORDER — HYDROMORPHONE HCL 2 MG PO TABS
2.0000 mg | ORAL_TABLET | Freq: Once | ORAL | Status: AC
  Administered 2012-11-15: 2 mg via ORAL
  Filled 2012-11-15: qty 1

## 2012-11-15 MED ORDER — HYDROMORPHONE HCL PF 1 MG/ML IJ SOLN
1.0000 mg | Freq: Once | INTRAMUSCULAR | Status: AC
  Administered 2012-11-15: 1 mg via INTRAVENOUS
  Filled 2012-11-15: qty 1

## 2012-11-15 NOTE — ED Notes (Signed)
Pt sts that she wants to go home now.

## 2012-11-15 NOTE — ED Notes (Signed)
Introduced self to pt.  Pt a/o at this time.  sts she is without pain.  No requests.

## 2012-11-15 NOTE — ED Provider Notes (Signed)
History    CSN: [id number] Arrival date & time 11/15/12  1756  First MD Initiated Contact with Patient 11/15/12 1804     Chief Complaint  Patient presents with  . Shaking   (Consider location/radiation/quality/duration/timing/severity/associated sxs/prior Treatment) HPI Patient presents emergency department following a surgical procedure to her right shoulder was shaking in her lower legs she states that her lower legs have been kicking and shaking since procedure.  Patient, states she had a similar reaction to a hand surgery several months, back.  Patient denies chest pain, shortness breath, nausea, vomiting, diarrhea, abdominal pain, back pain, headache, blurred vision, weakness, numbness, or syncope.  Patient, states nothing seems to make her condition, better or worse. Past Medical History  Diagnosis Date  . Hypertension   . Hyperlipemia   . Depression   . Anxiety   . Arthritis   . Insomnia   . CKD (chronic kidney disease), stage III    Past Surgical History  Procedure Laterality Date  . Tonsillectomy      age 74  . Abdominal hysterectomy      age 54  . Trigger finger release  2/12    rt middle  . Trigger finger release  2010    lt middle  . Small intestine surgery    . Small intestine surgery      has had 6 total bowel obstructions -mostlt adhesions-last 2009  . Carpal tunnel release  01/20/2012    Procedure: CARPAL TUNNEL RELEASE;  Surgeon: Nicki Reaper, MD;  Location: [facility];  Service: Orthopedics;  Laterality: Right;  . Carpal tunnel release  02/25/2012    Procedure: CARPAL TUNNEL RELEASE;  Surgeon: Nicki Reaper, MD;  Location: [facility];  Service: Orthopedics;  Laterality: Left;  RELEASE A-1 PULLEY LRF   History reviewed. No pertinent family history. History  Substance Use Topics  . Smoking status: Never Smoker   . Smokeless tobacco: Not on file  . Alcohol Use: No   OB History   Grav Para Term Preterm Abortions TAB SAB Ect  Mult Living                 Review of Systems All other systems negative except as documented in the HPI. All pertinent positives and negatives as reviewed in the HPI.  Allergies  Review of patient's allergies indicates no known allergies.  Home Medications   Current Outpatient Rx  Name  Route  Sig  Dispense  Refill  . aspirin 81 MG tablet   Oral   Take 81 mg by mouth daily.         . cholecalciferol (VITAMIN D) 1000 UNITS tablet   Oral   Take 1,000 Units by mouth daily.         Marland Kitchen conjugated estrogens (PREMARIN) vaginal cream   Vaginal   Place 1 g vaginally 2 (two) times a week.         . lidocaine (LIDODERM) 5 %   Transdermal   Place 1 patch onto the skin daily. Remove & Discard patch within 12 hours or as directed by MD         . lidocaine (XYLOCAINE JELLY) 2 % jelly      Apply to affected area daily prn   30 mL   1   . lisinopril-hydrochlorothiazide (PRINZIDE,ZESTORETIC) 20-12.5 MG per tablet   Oral   Take 1 tablet by mouth every evening.         . polyethylene glycol (  MIRALAX / GLYCOLAX) packet   Oral   Take 17 g by mouth daily.         . potassium chloride (K-DUR,KLOR-CON) 10 MEQ tablet   Oral   Take 10 mEq by mouth daily.         . pravastatin (PRAVACHOL) 20 MG tablet   Oral   Take 20 mg by mouth every evening.         . temazepam (RESTORIL) 30 MG capsule   Oral   Take 30 mg by mouth at bedtime as needed.          BP 145/76  Pulse 93  Temp(Src) 98.5 F (36.9 C) (Oral)  Resp 16  SpO2 99% Physical Exam  Constitutional: She is oriented to person, place, and time. She appears well-developed and well-nourished. No distress.  HENT:  Head: Normocephalic and atraumatic.  Eyes: Pupils are equal, round, and reactive to light.  Cardiovascular: Normal rate and regular rhythm.   Pulmonary/Chest: Effort normal and breath sounds normal.  Neurological: She is alert and oriented to person, place, and time.  Patient seems to take her legs,  more when I am performing motor and sensory testing.  Also was trying to do her reflexes and she kicked her legs more during this testing  Skin: Skin is warm and dry.    ED Course  Procedures (including critical care time)  The patient would like to go home.  I feel that these leg movements are more anxiety related rather than physiological patient is advised to return here as needed.  She has prescriptions for milligram Dilaudid tablets. MDM    Carlyle Dolly, PA-C 11/15/12 2150

## 2012-11-15 NOTE — ED Notes (Signed)
Pt from orthopedic surgical center post arthroscopic shoulder surgery today. After surgery pt began to have uncontrolled shaking to BUE and moving into BLE, did not resolve with 1mg  versed at 1600 so center decided to call for transport to ED. Pt reports a similar episode in the past after receiving procedural sedation. Pt is shaking uncontrollable on arrival to ED but has remained A&Ox4 since ems arrived to scene, VSS and denies any pain other than shoulder surgery site pain. Pt is clammy

## 2012-11-15 NOTE — ED Provider Notes (Signed)
Medical screening examination/treatment/procedure(s) were performed by non-physician practitioner and as supervising physician I was immediately available for consultation/collaboration.   Gilda Crease, MD 11/15/12 2202

## 2012-11-15 NOTE — ED Notes (Signed)
Pt given ice pack to take home.  No distress noted. Dc paperwork dicussed.  Pt taken to car via w/c.

## 2012-11-15 NOTE — ED Notes (Signed)
Pt at discharge sts that she wants her pain meds to work more.  Informed pt that pill should start to help soon.  Pt given juice and water to drink on request.  Pt sts that she wants to leave to f/u with her pcp in the am.

## 2012-11-15 NOTE — ED Notes (Signed)
Family and pt asking about discharging to home.

## 2012-12-08 ENCOUNTER — Other Ambulatory Visit: Payer: Self-pay

## 2013-03-10 ENCOUNTER — Other Ambulatory Visit: Payer: Self-pay

## 2013-09-02 ENCOUNTER — Encounter: Payer: Self-pay | Admitting: Podiatry

## 2014-04-25 ENCOUNTER — Other Ambulatory Visit: Payer: Self-pay | Admitting: Orthopaedic Surgery

## 2014-04-25 DIAGNOSIS — M542 Cervicalgia: Secondary | ICD-10-CM

## 2014-04-27 ENCOUNTER — Ambulatory Visit
Admission: RE | Admit: 2014-04-27 | Discharge: 2014-04-27 | Disposition: A | Discharge status: Home / Self Care | Payer: Medicare Other | Source: Ambulatory Visit | Attending: Orthopaedic Surgery | Admitting: Orthopaedic Surgery

## 2014-04-27 DIAGNOSIS — M542 Cervicalgia: Secondary | ICD-10-CM

## 2014-05-02 ENCOUNTER — Other Ambulatory Visit: Payer: Self-pay | Admitting: Orthopedic Surgery

## 2014-05-04 ENCOUNTER — Encounter (HOSPITAL_BASED_OUTPATIENT_CLINIC_OR_DEPARTMENT_OTHER): Payer: Self-pay | Admitting: *Deleted

## 2014-05-05 DIAGNOSIS — T8859XA Other complications of anesthesia, initial encounter: Secondary | ICD-10-CM

## 2014-05-05 HISTORY — DX: Other complications of anesthesia, initial encounter: T88.59XA

## 2014-05-08 ENCOUNTER — Other Ambulatory Visit: Payer: Self-pay

## 2014-05-08 ENCOUNTER — Encounter (HOSPITAL_BASED_OUTPATIENT_CLINIC_OR_DEPARTMENT_OTHER)
Admission: RE | Admit: 2014-05-08 | Discharge: 2014-05-08 | Disposition: A | Discharge status: Home / Self Care | Payer: Medicare Other | Source: Ambulatory Visit | Attending: Orthopedic Surgery | Admitting: Orthopedic Surgery

## 2014-05-08 DIAGNOSIS — G5622 Lesion of ulnar nerve, left upper limb: Secondary | ICD-10-CM | POA: Diagnosis not present

## 2014-05-08 DIAGNOSIS — E785 Hyperlipidemia, unspecified: Secondary | ICD-10-CM | POA: Diagnosis not present

## 2014-05-08 DIAGNOSIS — I129 Hypertensive chronic kidney disease with stage 1 through stage 4 chronic kidney disease, or unspecified chronic kidney disease: Secondary | ICD-10-CM | POA: Diagnosis not present

## 2014-05-08 DIAGNOSIS — G5621 Lesion of ulnar nerve, right upper limb: Secondary | ICD-10-CM | POA: Diagnosis not present

## 2014-05-08 DIAGNOSIS — G4700 Insomnia, unspecified: Secondary | ICD-10-CM | POA: Diagnosis not present

## 2014-05-08 DIAGNOSIS — F419 Anxiety disorder, unspecified: Secondary | ICD-10-CM | POA: Diagnosis not present

## 2014-05-08 DIAGNOSIS — M47892 Other spondylosis, cervical region: Secondary | ICD-10-CM | POA: Diagnosis not present

## 2014-05-08 DIAGNOSIS — N183 Chronic kidney disease, stage 3 (moderate): Secondary | ICD-10-CM | POA: Diagnosis not present

## 2014-05-08 DIAGNOSIS — F329 Major depressive disorder, single episode, unspecified: Secondary | ICD-10-CM | POA: Diagnosis not present

## 2014-05-08 DIAGNOSIS — Z888 Allergy status to other drugs, medicaments and biological substances status: Secondary | ICD-10-CM | POA: Diagnosis not present

## 2014-05-08 LAB — BASIC METABOLIC PANEL
ANION GAP: 5 (ref 5–15)
BUN: 15 mg/dL (ref 6–23)
CALCIUM: 9.3 mg/dL (ref 8.4–10.5)
CHLORIDE: 102 meq/L (ref 96–112)
CO2: 31 mmol/L (ref 19–32)
Creatinine, Ser: 0.88 mg/dL (ref 0.50–1.10)
GFR calc Af Amer: 76 mL/min — ABNORMAL LOW (ref >90)
GFR calc non Af Amer: 66 mL/min — ABNORMAL LOW (ref >90)
GLUCOSE: 76 mg/dL (ref 70–99)
Potassium: 4.2 mmol/L (ref 3.5–5.1)
SODIUM: 138 mmol/L (ref 135–145)

## 2014-05-08 NOTE — Pre-Procedure Instructions (Signed)
EKG reading by cardiologist reviewed with Dr. [name]; OK for surgery.

## 2014-05-11 ENCOUNTER — Ambulatory Visit (HOSPITAL_BASED_OUTPATIENT_CLINIC_OR_DEPARTMENT_OTHER): Payer: Medicare Other | Admitting: Anesthesiology

## 2014-05-11 ENCOUNTER — Encounter (HOSPITAL_BASED_OUTPATIENT_CLINIC_OR_DEPARTMENT_OTHER)
Admission: RE | Disposition: A | Discharge status: Home / Self Care | Payer: Self-pay | Source: Ambulatory Visit | Attending: Orthopedic Surgery

## 2014-05-11 ENCOUNTER — Ambulatory Visit (HOSPITAL_BASED_OUTPATIENT_CLINIC_OR_DEPARTMENT_OTHER)
Admission: RE | Admit: 2014-05-11 | Discharge: 2014-05-12 | Disposition: A | Discharge status: Home / Self Care | Payer: Medicare Other | Source: Ambulatory Visit | Attending: Orthopedic Surgery | Admitting: Orthopedic Surgery

## 2014-05-11 ENCOUNTER — Encounter (HOSPITAL_BASED_OUTPATIENT_CLINIC_OR_DEPARTMENT_OTHER): Payer: Self-pay | Admitting: Orthopedic Surgery

## 2014-05-11 DIAGNOSIS — G5620 Lesion of ulnar nerve, unspecified upper limb: Secondary | ICD-10-CM | POA: Diagnosis present

## 2014-05-11 DIAGNOSIS — G4700 Insomnia, unspecified: Secondary | ICD-10-CM | POA: Insufficient documentation

## 2014-05-11 DIAGNOSIS — Z888 Allergy status to other drugs, medicaments and biological substances status: Secondary | ICD-10-CM | POA: Insufficient documentation

## 2014-05-11 DIAGNOSIS — E785 Hyperlipidemia, unspecified: Secondary | ICD-10-CM | POA: Diagnosis not present

## 2014-05-11 DIAGNOSIS — G5622 Lesion of ulnar nerve, left upper limb: Secondary | ICD-10-CM | POA: Insufficient documentation

## 2014-05-11 DIAGNOSIS — F329 Major depressive disorder, single episode, unspecified: Secondary | ICD-10-CM | POA: Diagnosis not present

## 2014-05-11 DIAGNOSIS — G5621 Lesion of ulnar nerve, right upper limb: Secondary | ICD-10-CM | POA: Diagnosis not present

## 2014-05-11 DIAGNOSIS — I129 Hypertensive chronic kidney disease with stage 1 through stage 4 chronic kidney disease, or unspecified chronic kidney disease: Secondary | ICD-10-CM | POA: Insufficient documentation

## 2014-05-11 DIAGNOSIS — M25521 Pain in right elbow: Secondary | ICD-10-CM | POA: Diagnosis not present

## 2014-05-11 DIAGNOSIS — F419 Anxiety disorder, unspecified: Secondary | ICD-10-CM | POA: Diagnosis not present

## 2014-05-11 DIAGNOSIS — G8918 Other acute postprocedural pain: Secondary | ICD-10-CM | POA: Diagnosis not present

## 2014-05-11 DIAGNOSIS — N183 Chronic kidney disease, stage 3 (moderate): Secondary | ICD-10-CM | POA: Insufficient documentation

## 2014-05-11 DIAGNOSIS — M47892 Other spondylosis, cervical region: Secondary | ICD-10-CM | POA: Insufficient documentation

## 2014-05-11 HISTORY — PX: ULNAR NERVE TRANSPOSITION: SHX2595

## 2014-05-11 LAB — POCT HEMOGLOBIN-HEMACUE: Hemoglobin: 12.2 g/dL (ref 12.0–15.0)

## 2014-05-11 SURGERY — ULNAR NERVE DECOMPRESSION/TRANSPOSITION
Anesthesia: General | Site: Elbow | Laterality: Right

## 2014-05-11 MED ORDER — HYDROCODONE-ACETAMINOPHEN 5-325 MG PO TABS
ORAL_TABLET | ORAL | Status: AC
  Filled 2014-05-11: qty 2

## 2014-05-11 MED ORDER — CHLORHEXIDINE GLUCONATE 4 % EX LIQD
60.0000 mL | Freq: Once | CUTANEOUS | Status: DC
Start: 2014-05-11 — End: 2014-05-11

## 2014-05-11 MED ORDER — HYDROCODONE-ACETAMINOPHEN 7.5-325 MG PO TABS
1.0000 | ORAL_TABLET | ORAL | Status: DC | PRN
Start: 2014-05-11 — End: 2014-05-12

## 2014-05-11 MED ORDER — FENTANYL CITRATE 0.05 MG/ML IJ SOLN
INTRAMUSCULAR | Status: DC | PRN
  Administered 2014-05-11 (×2): 50 ug via INTRAVENOUS

## 2014-05-11 MED ORDER — OXYCODONE HCL 5 MG/5ML PO SOLN: 5.0000 mg | Freq: Once | ORAL | Status: DC | PRN

## 2014-05-11 MED ORDER — CEFAZOLIN SODIUM-DEXTROSE 2-3 GM-% IV SOLR
INTRAVENOUS | Status: DC | PRN
  Administered 2014-05-11: 2 g via INTRAVENOUS

## 2014-05-11 MED ORDER — CEFAZOLIN SODIUM-DEXTROSE 2-3 GM-% IV SOLR
INTRAVENOUS | Status: AC
  Filled 2014-05-11: qty 50

## 2014-05-11 MED ORDER — HYDROMORPHONE HCL 1 MG/ML IJ SOLN
0.2500 mg | INTRAMUSCULAR | Status: DC | PRN
  Administered 2014-05-11 (×2): 0.5 mg via INTRAVENOUS

## 2014-05-11 MED ORDER — HYDROCHLOROTHIAZIDE 12.5 MG PO CAPS
12.5000 mg | ORAL_CAPSULE | Freq: Once | ORAL | Status: AC
  Administered 2014-05-11: 12.5 mg via ORAL
  Filled 2014-05-11: qty 1

## 2014-05-11 MED ORDER — HYDROMORPHONE HCL 1 MG/ML IJ SOLN
INTRAMUSCULAR | Status: AC
  Filled 2014-05-11: qty 1

## 2014-05-11 MED ORDER — SUCCINYLCHOLINE CHLORIDE 20 MG/ML IJ SOLN
INTRAMUSCULAR | Status: DC | PRN
  Administered 2014-05-11: 30 mg via INTRAVENOUS

## 2014-05-11 MED ORDER — MIDAZOLAM HCL 2 MG/2ML IJ SOLN: 1.0000 mg | INTRAMUSCULAR | Status: DC | PRN

## 2014-05-11 MED ORDER — TEMAZEPAM 30 MG PO CAPS: 30.0000 mg | ORAL_CAPSULE | Freq: Every evening | ORAL | Status: DC | PRN

## 2014-05-11 MED ORDER — MIDAZOLAM HCL 2 MG/2ML IJ SOLN
INTRAMUSCULAR | Status: AC
  Filled 2014-05-11: qty 2

## 2014-05-11 MED ORDER — LIDOCAINE HCL (CARDIAC) 20 MG/ML IV SOLN
INTRAVENOUS | Status: DC | PRN
  Administered 2014-05-11: 100 mg via INTRAVENOUS

## 2014-05-11 MED ORDER — LISINOPRIL-HYDROCHLOROTHIAZIDE 20-12.5 MG PO TABS: 1.0000 | ORAL_TABLET | Freq: Every evening | ORAL | Status: DC

## 2014-05-11 MED ORDER — BUPROPION HCL 75 MG PO TABS
75.0000 mg | ORAL_TABLET | Freq: Three times a day (TID) | ORAL | Status: DC
  Administered 2014-05-11: 75 mg via ORAL

## 2014-05-11 MED ORDER — FENTANYL CITRATE 0.05 MG/ML IJ SOLN
INTRAMUSCULAR | Status: AC
  Filled 2014-05-11: qty 6

## 2014-05-11 MED ORDER — MEPERIDINE HCL 25 MG/ML IJ SOLN
INTRAMUSCULAR | Status: AC
  Filled 2014-05-11: qty 1

## 2014-05-11 MED ORDER — FENTANYL CITRATE 0.05 MG/ML IJ SOLN
INTRAMUSCULAR | Status: AC
  Filled 2014-05-11: qty 2

## 2014-05-11 MED ORDER — SODIUM CHLORIDE 0.9 % IV SOLN
12.5000 mg | INTRAVENOUS | Status: DC
  Administered 2014-05-11: 12.5 mg via INTRAVENOUS

## 2014-05-11 MED ORDER — OXYCODONE HCL 5 MG PO TABS
5.0000 mg | ORAL_TABLET | Freq: Once | ORAL | Status: DC | PRN
Start: 2014-05-11 — End: 2014-05-11

## 2014-05-11 MED ORDER — PROMETHAZINE HCL 12.5 MG RE SUPP: 6.2500 mg | Freq: Four times a day (QID) | RECTAL | Status: DC | PRN

## 2014-05-11 MED ORDER — DIPHENHYDRAMINE HCL 50 MG/ML IJ SOLN
INTRAMUSCULAR | Status: AC
  Filled 2014-05-11: qty 1

## 2014-05-11 MED ORDER — LACTATED RINGERS IV SOLN
INTRAVENOUS | Status: DC
  Administered 2014-05-11 (×3): via INTRAVENOUS

## 2014-05-11 MED ORDER — PAROXETINE HCL 10 MG PO TABS: 10.0000 mg | ORAL_TABLET | Freq: Every day | ORAL | Status: DC

## 2014-05-11 MED ORDER — POTASSIUM CHLORIDE CRYS ER 10 MEQ PO TBCR: 10.0000 meq | EXTENDED_RELEASE_TABLET | Freq: Every day | ORAL | Status: DC

## 2014-05-11 MED ORDER — OXYCODONE-ACETAMINOPHEN 5-325 MG PO TABS: 1.0000 | ORAL_TABLET | ORAL | Status: DC | PRN

## 2014-05-11 MED ORDER — CHLORHEXIDINE GLUCONATE 4 % EX LIQD: 60.0000 mL | Freq: Once | CUTANEOUS | Status: DC

## 2014-05-11 MED ORDER — LISINOPRIL 20 MG PO TABS
20.0000 mg | ORAL_TABLET | Freq: Once | ORAL | Status: AC
  Administered 2014-05-11: 20 mg via ORAL

## 2014-05-11 MED ORDER — CEFAZOLIN SODIUM-DEXTROSE 2-3 GM-% IV SOLR: 2.0000 g | INTRAVENOUS | Status: DC

## 2014-05-11 MED ORDER — TEMAZEPAM 15 MG PO CAPS
15.0000 mg | ORAL_CAPSULE | Freq: Every evening | ORAL | Status: DC | PRN
  Administered 2014-05-11: 15 mg via ORAL
  Filled 2014-05-11: qty 1

## 2014-05-11 MED ORDER — BUPROPION HCL 75 MG PO TABS
75.0000 mg | ORAL_TABLET | Freq: Once | ORAL | Status: DC
  Filled 2014-05-11: qty 1

## 2014-05-11 MED ORDER — PROPOFOL 10 MG/ML IV BOLUS
INTRAVENOUS | Status: DC | PRN
  Administered 2014-05-11: 200 mg via INTRAVENOUS
  Administered 2014-05-11: 100 mg via INTRAVENOUS

## 2014-05-11 MED ORDER — POLYETHYLENE GLYCOL 3350 17 G PO PACK: 17.0000 g | PACK | Freq: Every day | ORAL | Status: DC

## 2014-05-11 MED ORDER — LACTATED RINGERS IV SOLN
INTRAVENOUS | Status: DC
  Administered 2014-05-11: 17:00:00 via INTRAVENOUS

## 2014-05-11 MED ORDER — ESTROGENS, CONJUGATED 0.625 MG/GM VA CREA: 1.0000 g | TOPICAL_CREAM | VAGINAL | Status: DC

## 2014-05-11 MED ORDER — DEXAMETHASONE SODIUM PHOSPHATE 10 MG/ML IJ SOLN
INTRAMUSCULAR | Status: DC | PRN
  Administered 2014-05-11: 10 mg via INTRAVENOUS

## 2014-05-11 MED ORDER — ONDANSETRON HCL 4 MG/2ML IJ SOLN: 4.0000 mg | Freq: Once | INTRAMUSCULAR | Status: DC | PRN

## 2014-05-11 MED ORDER — BUPIVACAINE-EPINEPHRINE (PF) 0.5% -1:200000 IJ SOLN
INTRAMUSCULAR | Status: DC | PRN
  Administered 2014-05-11: 20 mL via PERINEURAL

## 2014-05-11 MED ORDER — FENTANYL CITRATE 0.05 MG/ML IJ SOLN
50.0000 ug | INTRAMUSCULAR | Status: DC | PRN
  Administered 2014-05-11: 100 ug via INTRAVENOUS

## 2014-05-11 MED ORDER — DIPHENHYDRAMINE HCL 50 MG/ML IJ SOLN
25.0000 mg | Freq: Once | INTRAMUSCULAR | Status: AC
  Administered 2014-05-11: 25 mg via INTRAVENOUS

## 2014-05-11 SURGICAL SUPPLY — 55 items
APL SKNCLS STERI-STRIP NONHPOA (GAUZE/BANDAGES/DRESSINGS) ×1
BENZOIN TINCTURE PRP APPL 2/3 (GAUZE/BANDAGES/DRESSINGS) ×1 IMPLANT
BLADE MINI RND TIP GREEN BEAV (BLADE) IMPLANT
BLADE SURG 15 STRL LF DISP TIS (BLADE) ×1 IMPLANT
BLADE SURG 15 STRL SS (BLADE) ×2
BNDG CMPR 9X4 STRL LF SNTH (GAUZE/BANDAGES/DRESSINGS) ×1
BNDG COHESIVE 3X5 TAN STRL LF (GAUZE/BANDAGES/DRESSINGS) ×2 IMPLANT
BNDG ESMARK 4X9 LF (GAUZE/BANDAGES/DRESSINGS) ×2 IMPLANT
BNDG GAUZE ELAST 4 BULKY (GAUZE/BANDAGES/DRESSINGS) ×2 IMPLANT
CHLORAPREP W/TINT 26ML (MISCELLANEOUS) ×2 IMPLANT
CORDS BIPOLAR (ELECTRODE) ×2 IMPLANT
COVER BACK TABLE 60X90IN (DRAPES) ×2 IMPLANT
COVER MAYO STAND STRL (DRAPES) ×2 IMPLANT
CUFF TOURNIQUET SINGLE 18IN (TOURNIQUET CUFF) ×2 IMPLANT
DECANTER SPIKE VIAL GLASS SM (MISCELLANEOUS) IMPLANT
DRAPE EXTREMITY T 121X128X90 (DRAPE) ×2 IMPLANT
DRAPE SURG 17X23 STRL (DRAPES) ×2 IMPLANT
DRSG PAD ABDOMINAL 8X10 ST (GAUZE/BANDAGES/DRESSINGS) ×2 IMPLANT
GAUZE SPONGE 4X4 12PLY STRL (GAUZE/BANDAGES/DRESSINGS) ×2 IMPLANT
GAUZE SPONGE 4X4 16PLY XRAY LF (GAUZE/BANDAGES/DRESSINGS) IMPLANT
GAUZE XEROFORM 1X8 LF (GAUZE/BANDAGES/DRESSINGS) ×2 IMPLANT
GLOVE BIOGEL PI IND STRL 7.0 (GLOVE) IMPLANT
GLOVE BIOGEL PI IND STRL 8.5 (GLOVE) ×1 IMPLANT
GLOVE BIOGEL PI INDICATOR 7.0 (GLOVE) ×1
GLOVE BIOGEL PI INDICATOR 8.5 (GLOVE) ×1
GLOVE ECLIPSE 6.5 STRL STRAW (GLOVE) ×1 IMPLANT
GLOVE EXAM NITRILE MD LF STRL (GLOVE) ×1 IMPLANT
GLOVE SURG ORTHO 8.0 STRL STRW (GLOVE) ×2 IMPLANT
GOWN STRL REUS W/ TWL LRG LVL3 (GOWN DISPOSABLE) ×1 IMPLANT
GOWN STRL REUS W/TWL LRG LVL3 (GOWN DISPOSABLE) ×2
GOWN STRL REUS W/TWL XL LVL3 (GOWN DISPOSABLE) ×2 IMPLANT
LOOP VESSEL MAXI BLUE (MISCELLANEOUS) IMPLANT
NDL PRECISIONGLIDE 27X1.5 (NEEDLE) IMPLANT
NEEDLE PRECISIONGLIDE 27X1.5 (NEEDLE) IMPLANT
NS IRRIG 1000ML POUR BTL (IV SOLUTION) ×2 IMPLANT
PACK BASIN DAY SURGERY FS (CUSTOM PROCEDURE TRAY) ×2 IMPLANT
PAD CAST 3X4 CTTN HI CHSV (CAST SUPPLIES) IMPLANT
PAD CAST 4YDX4 CTTN HI CHSV (CAST SUPPLIES) ×1 IMPLANT
PADDING CAST ABS 4INX4YD NS (CAST SUPPLIES)
PADDING CAST ABS COTTON 4X4 ST (CAST SUPPLIES) ×1 IMPLANT
PADDING CAST COTTON 3X4 STRL (CAST SUPPLIES)
PADDING CAST COTTON 4X4 STRL (CAST SUPPLIES)
SLEEVE SCD COMPRESS KNEE MED (MISCELLANEOUS) ×2 IMPLANT
SPLINT PLASTER CAST XFAST 3X15 (CAST SUPPLIES) IMPLANT
SPLINT PLASTER XTRA FASTSET 3X (CAST SUPPLIES)
STOCKINETTE 4X48 STRL (DRAPES) ×2 IMPLANT
STRIP CLOSURE SKIN 1/2X4 (GAUZE/BANDAGES/DRESSINGS) ×1 IMPLANT
SUT MNCRL AB 4-0 PS2 18 (SUTURE) ×1 IMPLANT
SUT VIC AB 2-0 SH 27 (SUTURE) ×2
SUT VIC AB 2-0 SH 27XBRD (SUTURE) ×1 IMPLANT
SUT VICRYL 4-0 PS2 18IN ABS (SUTURE) ×2 IMPLANT
SYR BULB 3OZ (MISCELLANEOUS) ×2 IMPLANT
SYR CONTROL 10ML LL (SYRINGE) ×2 IMPLANT
TOWEL OR 17X24 6PK STRL BLUE (TOWEL DISPOSABLE) ×2 IMPLANT
UNDERPAD 30X30 INCONTINENT (UNDERPADS AND DIAPERS) ×1 IMPLANT

## 2014-05-11 NOTE — Anesthesia Preprocedure Evaluation (Signed)
Anesthesia Evaluation  Patient identified by MRN, date of birth, ID band Patient awake    Reviewed: Allergy & Precautions, NPO status , Patient's Chart, lab work & pertinent test results  Airway Mallampati: I  TM Distance: >3 FB Neck ROM: Full    Dental  (+) Teeth Intact, Dental Advisory Given   Pulmonary    breath sounds clear to auscultation       Cardiovascular hypertension, Pt. on medications  Rhythm:Regular Rate:Normal     Neuro/Psych    GI/Hepatic   Endo/Other    Renal/GU      Musculoskeletal   Abdominal   Peds  Hematology   Anesthesia Other Findings   Reproductive/Obstetrics                             Anesthesia Physical Anesthesia Plan  ASA: II  Anesthesia Plan: General   Post-op Pain Management:    Induction: Intravenous  Airway Management Planned: LMA  Additional Equipment:   Intra-op Plan:   Post-operative Plan: Extubation in OR  Informed Consent: I have reviewed the patients History and Physical, chart, labs and discussed the procedure including the risks, benefits and alternatives for the proposed anesthesia with the patient or authorized representative who has indicated his/her understanding and acceptance.   Dental advisory given  Plan Discussed with: CRNA, Anesthesiologist and Surgeon  Anesthesia Plan Comments:         Anesthesia Quick Evaluation  

## 2014-05-11 NOTE — Discharge Instructions (Addendum)
[facility] Instructions °Hand Surgery ° °Wound Care: °Keep your hand elevated above the level of your heart.  Do not allow it to dangle by your side.  Keep the dressing dry and do not remove it unless your doctor advises you to do so.  He will usually change it at the time of your post-op visit.  Moving your fingers is advised to stimulate circulation but will depend on the site of your surgery.  If you have a splint applied, your doctor will advise you regarding movement. ° °Activity: °Do not drive or operate machinery today.  Rest today and then you may return to your normal activity and work as indicated by your physician. ° °Diet:  °Drink liquids today or eat a light diet.  You may resume a regular diet tomorrow.   ° °General expectations: °Pain for two to three days. °Fingers may become slightly swollen. ° °Call your doctor if any of the following occur: °Severe pain not relieved by pain medication. °Elevated temperature. °Dressing soaked with blood. °Inability to move fingers. °White or bluish color to fingers. ° ° °Post Anesthesia Home Care Instructions ° °Activity: °Get plenty of rest for the remainder of the day. A responsible adult should stay with you for 24 hours following the procedure.  °For the next 24 hours, DO NOT: °-Drive a car °-Operate machinery °-Drink alcoholic beverages °-Take any medication unless instructed by your physician °-Make any legal decisions or sign important papers. ° °Meals: °Start with liquid foods such as gelatin or soup. Progress to regular foods as tolerated. Avoid greasy, spicy, heavy foods. If nausea and/or vomiting occur, drink only clear liquids until the nausea and/or vomiting subsides. Call your physician if vomiting continues. ° °Special Instructions/Symptoms: °Your throat may feel dry or sore from the anesthesia or the breathing tube placed in your throat during surgery. If this causes discomfort, gargle with warm salt water. The discomfort should disappear within 24  hours. ° ° °Regional Anesthesia Blocks ° °1. Numbness or the inability to move the "blocked" extremity may last from 3-48 hours after placement. The length of time depends on the medication injected and your individual response to the medication. If the numbness is not going away after 48 hours, call your surgeon. ° °2. The extremity that is blocked will need to be protected until the numbness is gone and the  Strength has returned. Because you cannot feel it, you will need to take extra care to avoid injury. Because it may be weak, you may have difficulty moving it or using it. You may not know what position it is in without looking at it while the block is in effect. ° °3. For blocks in the legs and feet, returning to weight bearing and walking needs to be done carefully. You will need to wait until the numbness is entirely gone and the strength has returned. You should be able to move your leg and foot normally before you try and bear weight or walk. You will need someone to be with you when you first try to ensure you do not fall and possibly risk injury. ° °4. Bruising and tenderness at the needle site are common side effects and will resolve in a few days. ° °5. Persistent numbness or new problems with movement should be communicated to the surgeon or the [facility] ([phone])/ [facility] ([phone]). °

## 2014-05-11 NOTE — Progress Notes (Signed)
Called Dr. Al Corpus to come to bedside again to observe pt.  Demerol 12.5 mg ordered and administered again per Dr. Al Corpus orders.  No further  orders currently.

## 2014-05-11 NOTE — Anesthesia Procedure Notes (Signed)
Anesthesia Regional Block:  Supraclavicular block  Pre-Anesthetic Checklist: ,, timeout performed, Correct Patient, Correct Site, Correct Laterality, Correct Procedure, Correct Position, site marked, Risks and benefits discussed,  Surgical consent,  Pre-op evaluation,  At surgeon's request and post-op pain management  Laterality: Right and Upper  Prep: chloraprep       Needles:  Injection technique: Single-shot  Needle Type: Echogenic Stimulator Needle     Needle Length: 5cm 5 cm Needle Gauge: 21 and 21 G    Additional Needles:  Procedures: ultrasound guided (picture in chart) Supraclavicular block Narrative:  Start time: 05/11/2014 10:05 AM End time: 05/11/2014 10:09 AM Injection made incrementally with aspirations every 5 mL.  Performed by: Personally  Anesthesiologist: Winters, [place]

## 2014-05-11 NOTE — Progress Notes (Signed)
Pt. Observed to be jerking legs up and down in the bed and says thatshe " did this the last time I  had surgery and it was the anesthesia".  Dr. Al Corpus called and came to the bedside and observed pt  Admin Demerol 12.5mg  IV as ordered per MD..

## 2014-05-11 NOTE — Transfer of Care (Signed)
Immediate Anesthesia Transfer of Care Note  Patient: Gloria [name]  Procedure(s) Performed: Procedure(s): DECOMPRESSION ULNAR NERVE RIGHT ELBOW  (Right)  Patient Location: PACU  Anesthesia Type:General  Level of Consciousness: awake and patient cooperative  Airway & Oxygen Therapy: Patient Spontanous Breathing and Patient connected to face mask oxygen  Post-op Assessment: Report given to PACU RN and Post -op Vital signs reviewed and stable  Post vital signs: Reviewed and stable  Complications: No apparent anesthesia complications

## 2014-05-11 NOTE — Anesthesia Postprocedure Evaluation (Signed)
  Anesthesia Post-op Note  Patient: Gloria [name]  Procedure(s) Performed: Procedure(s): DECOMPRESSION ULNAR NERVE RIGHT ELBOW  (Right)  Patient Location: PACU  Anesthesia Type: General with regional   Level of Consciousness: awake, alert  and oriented  Airway and Oxygen Therapy: Patient Spontanous Breathing  Post-op Pain: mild  Post-op Assessment: Post-op Vital signs reviewed  Post-op Vital Signs: Reviewed  Last Vitals:  Filed Vitals:   05/11/14 1510  BP:   Pulse: 84  Temp:   Resp: 20    Complications: Pt began having jerking movements in her arms and legs that she has experienced after previous surgeries.  Unknown what the actual cause is but could be a reaction caused by Propofol and/or Versed.  It is a dystonic type of behavior and has improved over several hours and is now limited to her legs.  It is completely involuntary and she understands what is happening.  Due to the jerking, she is unable to walk on her own and cannot be sent home safely.  I have recommended that she at least be admitted overnight here at [facility] as these effects are usually time limited as the drugs are metabolized. If she is not improved in the morning, we will have her admitted to the hospital.  Dr. Fredna Dow is aware and agrees.

## 2014-05-11 NOTE — Brief Op Note (Signed)
05/11/2014  11:34 AM  PATIENT:  Gloria [name]  69 y.o. female  PRE-OPERATIVE DIAGNOSIS:  CUBITAL TUNNEL LEFT   POST-OPERATIVE DIAGNOSIS:  Left Cubital Tunnel  PROCEDURE:  Procedure(s): DECOMPRESSION ULNAR NERVE RIGHT ELBOW  (Right)  SURGEON:  Surgeon(s) and Role:    * Daryll Brod, MD - Primary  PHYSICIAN ASSISTANT:   ASSISTANTS: none   ANESTHESIA:   regional and general  EBL:  Total I/O In: 900 [I.V.:900] Out: -   BLOOD ADMINISTERED:none  DRAINS: none   LOCAL MEDICATIONS USED:  NONE  SPECIMEN:  No Specimen  DISPOSITION OF SPECIMEN:  N/A  COUNTS:  YES  TOURNIQUET:   Total Tourniquet Time Documented: Upper Arm (Left) - 19 minutes Total: Upper Arm (Left) - 19 minutes   DICTATION: .Other Dictation: Dictation Number [id number]  PLAN OF CARE: Discharge to home after PACU  PATIENT DISPOSITION:  PACU - hemodynamically stable.

## 2014-05-11 NOTE — H&P (Signed)
Gloria [name] is a 70 year old female.  She is referred by Dr. Gavin Pound. She is complaining of numbness and tingling in her fingers, catching of her index fingers bilaterally. This has been going on for the past 5-6 months. She is s/p bilateral carpal tunnel release by myself in 2013 with positive nerve conductions at that time. She has no new history of injury. She has a history of rheumatoid arthritis. She has not had repeated studies. She is not taking anything for the arthritis at this time.She has had her nerve conductions repeated and these reveal an improvement in her carpal tunnel syndromes bilaterally with motor delay of 4.2, 4.3, sensory delay of 2.4 bilaterally. This is significant improvement from her pre-release nerve conductions. However she has developed cubital tunnel syndrome bilaterally with conduction velocities diminution to 17.6 on the left and 27.9 on the right. She has seen Dr. Lorin Mercy for her neck. She has changes in her latency in her wrist in addition.   PAST MEDICAL HISTORY:  She has no drug allergies. She is on the following medications: Bupropion, Lisinopril, HCTZ, temazepam, paroxetine, alprazolam, Estrase cream, potassium and vitamin D. She has had carpal tunnel releases, multiple trigger finger releases.  FAMILY MEDICAL HISTORY: Positive for heart disease, high BP.  SOCIAL HISTORY:  She does not smoke or use alcohol. Gloria [name] is an 69 y.o. female.   Chief Complaint: cubital tunnel right elbow HPI: see above  Past Medical History  Diagnosis Date  . Hypertension   . Hyperlipemia   . Depression   . Anxiety   . Arthritis   . Insomnia   . CKD (chronic kidney disease), stage III     Past Surgical History  Procedure Laterality Date  . Tonsillectomy      age 58  . Abdominal hysterectomy      age 73  . Trigger finger release  2/12    rt middle  . Trigger finger release  2010    lt middle  . Small intestine surgery    . Small intestine surgery      has  had 6 total bowel obstructions -mostlt adhesions-last 2009  . Carpal tunnel release  01/20/2012    Procedure: CARPAL TUNNEL RELEASE;  Surgeon: Wynonia Sours, MD;  Location: [place];  Service: Orthopedics;  Laterality: Right;  . Carpal tunnel release  02/25/2012    Procedure: CARPAL TUNNEL RELEASE;  Surgeon: Wynonia Sours, MD;  Location: [place];  Service: Orthopedics;  Laterality: Left;  RELEASE A-1 PULLEY LRF  . Back surgery      History reviewed. No pertinent family history. Social History:  reports that she has never smoked. She does not have any smokeless tobacco history on file. She reports that she does not drink alcohol or use illicit drugs.  Allergies:  Allergies  Allergen Reactions  . Versed [Midazolam] Other (See Comments)  . Diprivan [Propofol] Other (See Comments)    Uncontrolled shaking    No prescriptions prior to admission    No results found for this or any previous visit (from the past 48 hour(s)).  No results found.   Pertinent items are noted in HPI.  Height 5' 6.5" (1.689 m), weight 58.968 kg (130 lb).  General appearance: alert, cooperative and appears stated age Head: Normocephalic, without obvious abnormality Neck: no JVD Resp: clear to auscultation bilaterally Cardio: regular rate and rhythm, S1, S2 normal, no murmur, click, rub or gallop GI: soft, non-tender; bowel sounds normal;  no masses,  no organomegaly Extremities: numbness bilateral hands  Pulses: 2+ and symmetric Skin: Skin color, texture, turgor normal. No rashes or lesions Neurologic: Grossly normal Incision/Wound: na  Assessment/Plan X-rays of her neck reveals degenerative changes multiple levels.  X-rays of her hands and wrists are normal. She does show some narrowing of the radiocarpal joints.  Diagnosis: (1) STS index fingers bilaterally. (2) Cervical spondylosis. (3) Bilateral cubital  tunnel syndrome.  We would recommend surgical decompression  to the ulnar nerves at her elbow. I would not rush her into release at the carpal canal despite there being changes at that level. Pre, peri and post op care are discussed along with risks and complications. Patient is aware there is no guarantee with surgery, possibility of infection, injury to arteries, nerves, and tendons, incomplete relief and dystrophy. She is aware we are attempting to halt the process. She shows intrinsic atrophy. She is advised this is probably not going to improve. Hopefully there may be some improvement in function but no further clawing. She would like to proceed. She is scheduled for right cubital tunnel release with decompression possible transposition ulnar nerve at her right elbow.  Gloria [name] R 05/11/2014, 5:08 AM

## 2014-05-11 NOTE — Op Note (Signed)
Dictation Number [id number]

## 2014-05-11 NOTE — Progress Notes (Signed)
Assisted Dr. Crews with right, ultrasound guided, supraclavicular block. Side rails up, monitors on throughout procedure. See vital signs in flow sheet. Tolerated Procedure well. 

## 2014-05-12 ENCOUNTER — Encounter (HOSPITAL_BASED_OUTPATIENT_CLINIC_OR_DEPARTMENT_OTHER): Payer: Self-pay | Admitting: Orthopedic Surgery

## 2014-05-12 DIAGNOSIS — G5621 Lesion of ulnar nerve, right upper limb: Secondary | ICD-10-CM | POA: Diagnosis not present

## 2014-05-12 DIAGNOSIS — F329 Major depressive disorder, single episode, unspecified: Secondary | ICD-10-CM | POA: Diagnosis not present

## 2014-05-12 DIAGNOSIS — E785 Hyperlipidemia, unspecified: Secondary | ICD-10-CM | POA: Diagnosis not present

## 2014-05-12 DIAGNOSIS — I129 Hypertensive chronic kidney disease with stage 1 through stage 4 chronic kidney disease, or unspecified chronic kidney disease: Secondary | ICD-10-CM | POA: Diagnosis not present

## 2014-05-12 DIAGNOSIS — F419 Anxiety disorder, unspecified: Secondary | ICD-10-CM | POA: Diagnosis not present

## 2014-05-12 DIAGNOSIS — G5622 Lesion of ulnar nerve, left upper limb: Secondary | ICD-10-CM | POA: Diagnosis not present

## 2014-05-12 MED ORDER — HYDROCODONE-ACETAMINOPHEN 5-325 MG PO TABS
1.0000 | ORAL_TABLET | ORAL | Status: DC | PRN
  Administered 2014-05-12 (×2): 2 via ORAL
  Filled 2014-05-12 (×2): qty 2

## 2014-05-12 MED ORDER — OXYCODONE-ACETAMINOPHEN 10-325 MG PO TABS: 1.0000 | ORAL_TABLET | ORAL | Status: DC | PRN

## 2014-05-12 NOTE — Addendum Note (Signed)
Addendum  created 05/12/14 1611 by Maryella Shivers, CRNA   Modules edited: Charges VN

## 2014-05-12 NOTE — Progress Notes (Signed)
Gloria [name] was admitted for observation due to having a side effect from anesthesia that caused her to not be able to walk safely.  She began having severe shaking of her upper and lower body during her recovery process within 2 hours of finishing her surgery.  She received a Supraclavicular block with Bupivacaine 0.5 % that was fairly successful in controlling her pain.  In addition, she underwent a general anesthetic with Propofol and Sevoflurane. Small doses of Fentanyl were used as well. No Versed was given.  The shaking continued during the afternoon and until around 5 AM today.  The upper body was calm after the first 3 hours.  In reading about the possible drug side effect she is having it appears she may be having a reaction to the combination of Zofran and Propofol which can cause a neuroexcitatory state resulting in a confusing picture that can mimic a neuroleptic crisis.  She was treated with Benadryl and Demerol in the PACU to attempt to cover post op shivering that can be seen in rare individuals.  I have discussed these problems at length with her and she understands.  I saw no shaking on exam and interview today.  She will need future surgery; she is open to a repeat nerve block ( placed well in advance), as well as possible inhalation induction and maintenance of anesthesia if needed.  It may be best to avoid Zofran, as she has never had nausea with any of her surgeries.  It should be noted that this constellation of symptoms has occurred in some prior surgeries and Propofol may be part of the problem.  It is unknown whether Etomidate would cause issues but it can be excitatory itself and may provoke it as well.

## 2014-05-12 NOTE — Op Note (Signed)
NAMEBoykin Nearing               ACCOUNT NO.:  [id number]  MEDICAL RECORD NO.:  [id number]  LOCATION:                                 FACILITY:  PHYSICIAN:  Daryll Brod, M.D.            DATE OF BIRTH:  DATE OF PROCEDURE:  05/11/2014 DATE OF DISCHARGE:                              OPERATIVE REPORT   PREOPERATIVE DIAGNOSIS:  Cubital tunnel syndrome, right elbow.  POSTOPERATIVE DIAGNOSIS:  Cubital tunnel syndrome, right elbow.  OPERATION:  Decompression of ulnar nerve, right elbow.  SURGEON:  Daryll Brod, M.D.  ANESTHESIA:  Supraclavicular block, general.  ANESTHESIOLOGIST:  Lorrene Reid, MD  HISTORY:  The patient is a 69 year old female with a history of bilateral cubital tunnel, nerve conduction is positive.  This has not responded to conservative treatment.  She has elected to undergo surgical decompression, possible transposition ulnar nerve of her right elbow.  Pre, peri, and postoperative course have been discussed along with risks and complications.  She is aware there is no guarantee with the surgery, possibility of infection; recurrence of injury to arteries, nerves, tendons; incomplete relief of symptoms, dystrophy.  In the preoperative area, the extremity was marked by both the patient and surgeon.  Antibiotic given.  PROCEDURE IN DETAIL:  The patient was brought to the operating room, where a supraclavicular block had been carried out in the preoperative area with added general anesthetic.  She was prepped using ChloraPrep in supine position with the right arm free.  A 3-minute dry time was allowed.  Time-out taken, confirming the patient and procedure.  The limb was exsanguinated with an Esmarch bandage.  Tourniquet placed high and the arm was inflated to 250 mmHg.  A longitudinal incision was made over the medial epicondyle of the right elbow, carried down through subcutaneous tissue.  Bleeders were electrocauterized.  Dissection was carried down to the  posterior aspect of Osborne's fascia.  This was released on its most posterior aspect.  The distal forearm fascia was then separated from the overlying skin and subcutaneous tissue over the flexor carpi ulnaris.  The fascia was then released after placement of a knee retractor.  The flexor carpi ulnaris muscle belly was then split with a blunt dissection.  A KMI carpal tunnel guide was then placed between the nerve and the deep fascia, and the deep fascia was released for approximately 8 cm distally with an angled ENT scissors.  Attention was then directed proximally.  The brachial fascia was then separated from the overlying subcutaneous tissue.  Knee retractors again placed. The [facility] guide was then placed between the ulnar nerve proximally and the brachial fascia, and this was released proximally to the level of Osborne's ligament using the ENT angled scissors under direct vision. The arm was then fully flexed.  The nerve did not subluxate.  The wounds were copiously irrigated with saline.  The Osborne's fascia was then repaired to the posterior skin flap with figure-of-eight 2-0 Vicryl sutures.  Subcutaneous tissue was closed with interrupted Vicryl and the skin with a subcuticular 4-0 Monocryl suture.  Benzoin and Steri-Strips were applied.  Sterile compressive dressing was applied.  On deflation of  the tourniquet, all fingers immediately pinked.  She was taken to the recovery room for observation in satisfactory condition.  She will be discharged home to return to the [facility] in 1 week on Percocet.          ______________________________ Daryll Brod, M.D.     GK/MEDQ  D:  05/11/2014  T:  05/12/2014  Job:  [id number]

## 2014-05-12 NOTE — Op Note (Signed)
Patient awake and alert  Ulnar finger sensory function intact Intrinsic muscle functioning Ambulating Desires Percocet for pain.

## 2014-05-12 NOTE — Progress Notes (Signed)
Arm & leg shaking causes entire body to shake.  Pt states, "this is exhausting".

## 2014-05-24 DIAGNOSIS — M6530 Trigger finger, unspecified finger: Secondary | ICD-10-CM | POA: Diagnosis not present

## 2014-05-24 DIAGNOSIS — G5600 Carpal tunnel syndrome, unspecified upper limb: Secondary | ICD-10-CM | POA: Diagnosis not present

## 2014-05-24 DIAGNOSIS — G5620 Lesion of ulnar nerve, unspecified upper limb: Secondary | ICD-10-CM | POA: Diagnosis not present

## 2014-05-24 DIAGNOSIS — M0609 Rheumatoid arthritis without rheumatoid factor, multiple sites: Secondary | ICD-10-CM | POA: Diagnosis not present

## 2014-05-31 DIAGNOSIS — N183 Chronic kidney disease, stage 3 (moderate): Secondary | ICD-10-CM | POA: Diagnosis not present

## 2014-05-31 DIAGNOSIS — I10 Essential (primary) hypertension: Secondary | ICD-10-CM | POA: Diagnosis not present

## 2014-05-31 DIAGNOSIS — M542 Cervicalgia: Secondary | ICD-10-CM | POA: Diagnosis not present

## 2014-05-31 DIAGNOSIS — E780 Pure hypercholesterolemia: Secondary | ICD-10-CM | POA: Diagnosis not present

## 2014-06-28 ENCOUNTER — Other Ambulatory Visit: Payer: Self-pay | Admitting: Orthopedic Surgery

## 2014-07-03 ENCOUNTER — Encounter (HOSPITAL_BASED_OUTPATIENT_CLINIC_OR_DEPARTMENT_OTHER): Payer: Self-pay | Admitting: *Deleted

## 2014-07-03 NOTE — Progress Notes (Signed)
Will need Gloria [name] Pt had severe tremors post op-had to be kept observation-has had them post op, but not as bad-dr crews wrote long note about this

## 2014-07-06 ENCOUNTER — Ambulatory Visit (HOSPITAL_BASED_OUTPATIENT_CLINIC_OR_DEPARTMENT_OTHER)
Admission: RE | Admit: 2014-07-06 | Discharge: 2014-07-06 | Disposition: A | Discharge status: Home / Self Care | Payer: Medicare Other | Source: Ambulatory Visit | Attending: Orthopedic Surgery | Admitting: Orthopedic Surgery

## 2014-07-06 ENCOUNTER — Encounter (HOSPITAL_BASED_OUTPATIENT_CLINIC_OR_DEPARTMENT_OTHER): Payer: Self-pay | Admitting: Orthopedic Surgery

## 2014-07-06 ENCOUNTER — Ambulatory Visit (HOSPITAL_BASED_OUTPATIENT_CLINIC_OR_DEPARTMENT_OTHER): Payer: Medicare Other | Admitting: Anesthesiology

## 2014-07-06 ENCOUNTER — Encounter (HOSPITAL_BASED_OUTPATIENT_CLINIC_OR_DEPARTMENT_OTHER)
Admission: RE | Disposition: A | Discharge status: Home / Self Care | Payer: Self-pay | Source: Ambulatory Visit | Attending: Orthopedic Surgery

## 2014-07-06 DIAGNOSIS — I129 Hypertensive chronic kidney disease with stage 1 through stage 4 chronic kidney disease, or unspecified chronic kidney disease: Secondary | ICD-10-CM | POA: Diagnosis not present

## 2014-07-06 DIAGNOSIS — N183 Chronic kidney disease, stage 3 (moderate): Secondary | ICD-10-CM | POA: Diagnosis not present

## 2014-07-06 DIAGNOSIS — M25522 Pain in left elbow: Secondary | ICD-10-CM | POA: Diagnosis not present

## 2014-07-06 DIAGNOSIS — G5622 Lesion of ulnar nerve, left upper limb: Secondary | ICD-10-CM | POA: Diagnosis not present

## 2014-07-06 DIAGNOSIS — E785 Hyperlipidemia, unspecified: Secondary | ICD-10-CM | POA: Insufficient documentation

## 2014-07-06 DIAGNOSIS — F329 Major depressive disorder, single episode, unspecified: Secondary | ICD-10-CM | POA: Diagnosis not present

## 2014-07-06 DIAGNOSIS — Z888 Allergy status to other drugs, medicaments and biological substances status: Secondary | ICD-10-CM | POA: Diagnosis not present

## 2014-07-06 DIAGNOSIS — F419 Anxiety disorder, unspecified: Secondary | ICD-10-CM | POA: Diagnosis not present

## 2014-07-06 DIAGNOSIS — G8918 Other acute postprocedural pain: Secondary | ICD-10-CM | POA: Diagnosis not present

## 2014-07-06 DIAGNOSIS — Z90710 Acquired absence of both cervix and uterus: Secondary | ICD-10-CM | POA: Insufficient documentation

## 2014-07-06 HISTORY — DX: Adverse effect of unspecified anesthetic, initial encounter: T41.45XA

## 2014-07-06 HISTORY — PX: ULNAR NERVE TRANSPOSITION: SHX2595

## 2014-07-06 SURGERY — ULNAR NERVE DECOMPRESSION/TRANSPOSITION
Anesthesia: Monitor Anesthesia Care | Site: Elbow | Laterality: Left

## 2014-07-06 MED ORDER — BUPIVACAINE-EPINEPHRINE (PF) 0.5% -1:200000 IJ SOLN
INTRAMUSCULAR | Status: DC | PRN
  Administered 2014-07-06: 25 mL

## 2014-07-06 MED ORDER — OXYCODONE HCL 5 MG PO TABS: 5.0000 mg | ORAL_TABLET | Freq: Once | ORAL | Status: DC | PRN

## 2014-07-06 MED ORDER — CHLORHEXIDINE GLUCONATE 4 % EX LIQD: 60.0000 mL | Freq: Once | CUTANEOUS | Status: DC

## 2014-07-06 MED ORDER — HYDROMORPHONE HCL 1 MG/ML IJ SOLN: 0.2500 mg | INTRAMUSCULAR | Status: DC | PRN

## 2014-07-06 MED ORDER — ONDANSETRON HCL 4 MG/2ML IJ SOLN: 4.0000 mg | Freq: Once | INTRAMUSCULAR | Status: DC | PRN

## 2014-07-06 MED ORDER — OXYCODONE-ACETAMINOPHEN 5-325 MG PO TABS: 1.0000 | ORAL_TABLET | ORAL | Status: DC | PRN

## 2014-07-06 MED ORDER — OXYCODONE HCL 5 MG/5ML PO SOLN: 5.0000 mg | Freq: Once | ORAL | Status: DC | PRN

## 2014-07-06 MED ORDER — FENTANYL CITRATE 0.05 MG/ML IJ SOLN
INTRAMUSCULAR | Status: AC
  Filled 2014-07-06: qty 4

## 2014-07-06 MED ORDER — BUPIVACAINE HCL (PF) 0.25 % IJ SOLN
INTRAMUSCULAR | Status: DC | PRN
  Administered 2014-07-06 (×2): 5 mL

## 2014-07-06 MED ORDER — CEFAZOLIN SODIUM-DEXTROSE 2-3 GM-% IV SOLR
INTRAVENOUS | Status: AC
  Filled 2014-07-06: qty 50

## 2014-07-06 MED ORDER — MIDAZOLAM HCL 2 MG/2ML IJ SOLN
1.0000 mg | INTRAMUSCULAR | Status: DC | PRN
Start: 2014-07-06 — End: 2014-07-06

## 2014-07-06 MED ORDER — LACTATED RINGERS IV SOLN
INTRAVENOUS | Status: DC
Start: 2014-07-06 — End: 2014-07-06
  Administered 2014-07-06 (×2): via INTRAVENOUS

## 2014-07-06 MED ORDER — FENTANYL CITRATE 0.05 MG/ML IJ SOLN
INTRAMUSCULAR | Status: AC
  Filled 2014-07-06: qty 2

## 2014-07-06 MED ORDER — CEFAZOLIN SODIUM-DEXTROSE 2-3 GM-% IV SOLR: 2.0000 g | INTRAVENOUS | Status: DC

## 2014-07-06 MED ORDER — MIDAZOLAM HCL 2 MG/2ML IJ SOLN
INTRAMUSCULAR | Status: AC
  Filled 2014-07-06: qty 2

## 2014-07-06 MED ORDER — CEFAZOLIN SODIUM-DEXTROSE 2-3 GM-% IV SOLR
2.0000 g | INTRAVENOUS | Status: AC
  Administered 2014-07-06: 2 g via INTRAVENOUS

## 2014-07-06 MED ORDER — FENTANYL CITRATE 0.05 MG/ML IJ SOLN
50.0000 ug | INTRAMUSCULAR | Status: DC | PRN
  Administered 2014-07-06: 100 ug via INTRAVENOUS

## 2014-07-06 SURGICAL SUPPLY — 53 items
BLADE MINI RND TIP GREEN BEAV (BLADE) IMPLANT
BLADE SURG 15 STRL LF DISP TIS (BLADE) ×1 IMPLANT
BLADE SURG 15 STRL SS (BLADE) ×2
BNDG CMPR 9X4 STRL LF SNTH (GAUZE/BANDAGES/DRESSINGS) ×1
BNDG COHESIVE 3X5 TAN STRL LF (GAUZE/BANDAGES/DRESSINGS) ×2 IMPLANT
BNDG ESMARK 4X9 LF (GAUZE/BANDAGES/DRESSINGS) ×2 IMPLANT
BNDG GAUZE ELAST 4 BULKY (GAUZE/BANDAGES/DRESSINGS) ×2 IMPLANT
CHLORAPREP W/TINT 26ML (MISCELLANEOUS) ×2 IMPLANT
CORDS BIPOLAR (ELECTRODE) ×2 IMPLANT
COVER BACK TABLE 60X90IN (DRAPES) ×2 IMPLANT
COVER MAYO STAND STRL (DRAPES) ×2 IMPLANT
CUFF TOURN SGL LL 18 NRW (TOURNIQUET CUFF) ×2 IMPLANT
CUFF TOURNIQUET SINGLE 18IN (TOURNIQUET CUFF) ×2 IMPLANT
DECANTER SPIKE VIAL GLASS SM (MISCELLANEOUS) IMPLANT
DRAPE EXTREMITY T 121X128X90 (DRAPE) ×2 IMPLANT
DRAPE SURG 17X23 STRL (DRAPES) ×2 IMPLANT
DRSG PAD ABDOMINAL 8X10 ST (GAUZE/BANDAGES/DRESSINGS) ×2 IMPLANT
GAUZE SPONGE 4X4 12PLY STRL (GAUZE/BANDAGES/DRESSINGS) ×2 IMPLANT
GAUZE SPONGE 4X4 16PLY XRAY LF (GAUZE/BANDAGES/DRESSINGS) IMPLANT
GAUZE XEROFORM 1X8 LF (GAUZE/BANDAGES/DRESSINGS) ×2 IMPLANT
GLOVE BIOGEL PI IND STRL 7.0 (GLOVE) IMPLANT
GLOVE BIOGEL PI IND STRL 8.5 (GLOVE) ×1 IMPLANT
GLOVE BIOGEL PI INDICATOR 7.0 (GLOVE) ×2
GLOVE BIOGEL PI INDICATOR 8.5 (GLOVE) ×1
GLOVE ECLIPSE 6.5 STRL STRAW (GLOVE) ×1 IMPLANT
GLOVE SURG ORTHO 8.0 STRL STRW (GLOVE) ×2 IMPLANT
GOWN STRL REUS W/ TWL LRG LVL3 (GOWN DISPOSABLE) ×1 IMPLANT
GOWN STRL REUS W/TWL LRG LVL3 (GOWN DISPOSABLE) ×2
GOWN STRL REUS W/TWL XL LVL3 (GOWN DISPOSABLE) ×2 IMPLANT
LOOP VESSEL MAXI BLUE (MISCELLANEOUS) IMPLANT
NDL PRECISIONGLIDE 27X1.5 (NEEDLE) IMPLANT
NEEDLE PRECISIONGLIDE 27X1.5 (NEEDLE) ×2 IMPLANT
NS IRRIG 1000ML POUR BTL (IV SOLUTION) ×2 IMPLANT
PACK BASIN DAY SURGERY FS (CUSTOM PROCEDURE TRAY) ×2 IMPLANT
PAD CAST 3X4 CTTN HI CHSV (CAST SUPPLIES) IMPLANT
PAD CAST 4YDX4 CTTN HI CHSV (CAST SUPPLIES) ×1 IMPLANT
PADDING CAST ABS 4INX4YD NS (CAST SUPPLIES) ×1
PADDING CAST ABS COTTON 4X4 ST (CAST SUPPLIES) ×1 IMPLANT
PADDING CAST COTTON 3X4 STRL (CAST SUPPLIES)
PADDING CAST COTTON 4X4 STRL (CAST SUPPLIES) ×2
SLEEVE SCD COMPRESS KNEE MED (MISCELLANEOUS) ×2 IMPLANT
SLING ARM FOAM STRAP LRG (SOFTGOODS) ×1 IMPLANT
SPLINT PLASTER CAST XFAST 3X15 (CAST SUPPLIES) IMPLANT
SPLINT PLASTER XTRA FASTSET 3X (CAST SUPPLIES)
STOCKINETTE 4X48 STRL (DRAPES) ×2 IMPLANT
SUT ETHILON 4 0 PS 2 18 (SUTURE) ×2 IMPLANT
SUT VIC AB 2-0 SH 27 (SUTURE) ×2
SUT VIC AB 2-0 SH 27XBRD (SUTURE) ×1 IMPLANT
SUT VICRYL 4-0 PS2 18IN ABS (SUTURE) ×2 IMPLANT
SYR BULB 3OZ (MISCELLANEOUS) ×2 IMPLANT
SYR CONTROL 10ML LL (SYRINGE) ×2 IMPLANT
TOWEL OR 17X24 6PK STRL BLUE (TOWEL DISPOSABLE) ×2 IMPLANT
UNDERPAD 30X30 INCONTINENT (UNDERPADS AND DIAPERS) ×1 IMPLANT

## 2014-07-06 NOTE — Brief Op Note (Signed)
07/06/2014  10:55 AM  PATIENT:  Heide Scales  69 y.o. female  PRE-OPERATIVE DIAGNOSIS:  LEFT CUBITAL TUNNEL SYNDROME  POST-OPERATIVE DIAGNOSIS:  LEFT CUBITAL TUNNEL SYNDROME  PROCEDURE:  Procedure(s): DECOMPRESSION  LEFT ULNAR NERVE (Left)  SURGEON:  Surgeon(s) and Role:    * Daryll Brod, MD - Primary  PHYSICIAN ASSISTANT:   ASSISTANTS: none   ANESTHESIA:   local, regional and general  EBL:  Total I/O In: 1000 [I.V.:1000] Out: -   BLOOD ADMINISTERED:none  DRAINS: none   LOCAL MEDICATIONS USED:  BUPIVICAINE   SPECIMEN:  No Specimen  DISPOSITION OF SPECIMEN:  N/A  COUNTS:  YES  TOURNIQUET:   Total Tourniquet Time Documented: Upper Arm (Left) - 20 minutes Total: Upper Arm (Left) - 20 minutes   DICTATION: .Other Dictation: Dictation Number [id number]  PLAN OF CARE: Discharge to home after PACU  PATIENT DISPOSITION:  PACU - hemodynamically stable.

## 2014-07-06 NOTE — H&P (Signed)
Gloria [name] is 69 years old and referred by Dr. Gavin Pound. She is complaining of numbness and tingling in her fingers, catching of her index fingers bilaterally. This has been going on for the past 5-6 months. She is s/p bilateral carpal tunnel release by myself in 2013 with positive nerve conductions at that time. She has no new history of injury. She has a history of rheumatoid arthritis. She has not had repeated studies. She is not taking anything for the arthritis at this time. She is s/p bilateral carpal tunnel releases.  PAST MEDICAL HISTORY:  She has no drug allergies. She is on the following medications: Bupropion, Lisinopril, HCTZ, temazepam, paroxetine, alprazolam, Estrase cream, potassium and vitamin D. She has had carpal tunnel releases, multiple trigger finger releases.  FAMILY MEDICAL HISTORY: Positive for heart disease, high BP.  SOCIAL HISTORY:  She does not smoke or use alcohol. Gloria [name] is an 69 y.o. female.   Chief Complaint: cubital tunnel syndrome left elbow HPI: see above  Past Medical History  Diagnosis Date  . Hypertension   . Hyperlipemia   . Depression   . Anxiety   . Arthritis   . Insomnia   . CKD (chronic kidney disease), stage III   . Complication of anesthesia 05/2014    had body shaking-observed over night-see anesthesia note-has had tremors post op in past    Past Surgical History  Procedure Laterality Date  . Tonsillectomy      age 35  . Abdominal hysterectomy      age 45  . Trigger finger release  2/12    rt middle  . Trigger finger release  2010    lt middle  . Small intestine surgery    . Small intestine surgery      has had 6 total bowel obstructions -mostlt adhesions-last 2009  . Carpal tunnel release  01/20/2012    Procedure: CARPAL TUNNEL RELEASE;  Surgeon: Wynonia Sours, MD;  Location: [facility];  Service: Orthopedics;  Laterality: Right;  . Carpal tunnel release  02/25/2012    Procedure: CARPAL TUNNEL RELEASE;   Surgeon: Wynonia Sours, MD;  Location: [facility];  Service: Orthopedics;  Laterality: Left;  RELEASE A-1 PULLEY LRF  . Back surgery    . Ulnar nerve transposition Right 05/11/2014    Procedure: DECOMPRESSION ULNAR NERVE RIGHT ELBOW ;  Surgeon: Daryll Brod, MD;  Location: [place];  Service: Orthopedics;  Laterality: Right;    History reviewed. No pertinent family history. Social History:  reports that she has never smoked. She does not have any smokeless tobacco history on file. She reports that she does not drink alcohol or use illicit drugs.  Allergies:  Allergies  Allergen Reactions  . Versed [Midazolam] Other (See Comments)  . Diprivan [Propofol] Other (See Comments)    Uncontrolled shaking    No prescriptions prior to admission    No results found for this or any previous visit (from the past 48 hour(s)).  No results found.   Pertinent items are noted in HPI.  Height 5' 6.5" (1.689 m), weight 58.06 kg (128 lb).  General appearance: alert, cooperative and appears stated age Head: Normocephalic, without obvious abnormality Neck: no JVD Resp: clear to auscultation bilaterally Cardio: regular rate and rhythm, S1, S2 normal, no murmur, click, rub or gallop GI: soft, non-tender; bowel sounds normal; no masses,  no organomegaly Extremities: numbness left hand ulnar nerve distribution Pulses: 2+ and symmetric Skin: Skin color,  texture, turgor normal. No rashes or lesions Neurologic: Grossly normal Incision/Wound: na  Assessment/Plan  She would like to proceed to have her left side done.  She does have positive nerve conductions on her left side with velocity diminution conduction delay. She is scheduled for decompression possible transposition left ulnar nerve at the elbow. Questions are encouraged and answered to the patient's satisfaction. Pre, peri and post op care are discussed along with risks and complications. Patient is aware there is no  guarantee with surgery, possibility of infection, injury to arteries, nerves, and tendons, incomplete relief and dystrophy. She is aware we are attempting to halt the process and hopefully allow it to get better but there is no guarantee of this.  Mathieu Schloemer R 07/06/2014, 7:56 AM

## 2014-07-06 NOTE — Anesthesia Procedure Notes (Addendum)
Anesthesia Regional Block:  Supraclavicular block  Pre-Anesthetic Checklist: ,, timeout performed, Correct Patient, Correct Site, Correct Laterality, Correct Procedure, Correct Position, site marked, Risks and benefits discussed,  Surgical consent,  Pre-op evaluation,  At surgeon's request and post-op pain management  Laterality: Left and Upper  Prep: chloraprep       Needles:  Injection technique: Single-shot  Needle Type: Echogenic Stimulator Needle     Needle Length: 5cm 5 cm Needle Gauge: 21 and 21 G    Additional Needles:  Procedures: ultrasound guided (picture in chart) Supraclavicular block Narrative:  Start time: 07/06/2014 9:20 AM End time: 07/06/2014 9:25 AM Injection made incrementally with aspirations every 5 mL.  Performed by: Personally  Anesthesiologist: Yarissa Reining A   Procedure Name: LMA Insertion Date/Time: 07/06/2014 10:20 AM Performed by: Lieutenant Diego Pre-anesthesia Checklist: Patient identified, Emergency Drugs available, Suction available and Patient being monitored Patient Re-evaluated:Patient Re-evaluated prior to inductionOxygen Delivery Method: Circle System Utilized Intubation Type: Inhalational induction Ventilation: Mask ventilation without difficulty LMA: LMA inserted LMA Size: 4.0 Number of attempts: 1 Placement Confirmation: positive ETCO2 and breath sounds checked- equal and bilateral Tube secured with: Tape Dental Injury: Teeth and Oropharynx as per pre-operative assessment  Comments: Due to patient allergy to propofol and versed. We performed an inhalation induction. Pt tolerated well. LMA placed with ease.

## 2014-07-06 NOTE — Anesthesia Preprocedure Evaluation (Signed)
Anesthesia Evaluation  Patient identified by MRN, date of birth, ID band Patient awake    Reviewed: Allergy & Precautions, NPO status , Patient's Chart, lab work & pertinent test results  Airway Mallampati: I  TM Distance: >3 FB Neck ROM: Full    Dental  (+) Teeth Intact, Dental Advisory Given   Pulmonary  breath sounds clear to auscultation        Cardiovascular hypertension, Pt. on medications Rhythm:Regular Rate:Normal     Neuro/Psych    GI/Hepatic   Endo/Other    Renal/GU      Musculoskeletal   Abdominal   Peds  Hematology   Anesthesia Other Findings   Reproductive/Obstetrics                             Anesthesia Physical Anesthesia Plan  ASA: II  Anesthesia Plan: MAC   Post-op Pain Management: MAC Combined w/ Regional for Post-op pain   Induction: Intravenous  Airway Management Planned: Simple Face Mask  Additional Equipment:   Intra-op Plan:   Post-operative Plan:   Informed Consent: I have reviewed the patients History and Physical, chart, labs and discussed the procedure including the risks, benefits and alternatives for the proposed anesthesia with the patient or authorized representative who has indicated his/her understanding and acceptance.   Dental advisory given  Plan Discussed with: Anesthesiologist, CRNA and Surgeon  Anesthesia Plan Comments:         Anesthesia Quick Evaluation

## 2014-07-06 NOTE — Progress Notes (Signed)
  Assisted Dr. Crews with left, ultrasound guided, supraclavicular block. Side rails up, monitors on throughout procedure. See vital signs in flow sheet. Tolerated Procedure well. 

## 2014-07-06 NOTE — Discharge Instructions (Addendum)
[facility] Instructions °Hand Surgery ° °Wound Care: °Keep your hand elevated above the level of your heart.  Do not allow it to dangle by your side.  Keep the dressing dry and do not remove it unless your doctor advises you to do so.  He will usually change it at the time of your post-op visit.  Moving your fingers is advised to stimulate circulation but will depend on the site of your surgery.  If you have a splint applied, your doctor will advise you regarding movement. ° °Activity: °Do not drive or operate machinery today.  Rest today and then you may return to your normal activity and work as indicated by your physician. ° °Diet:  °Drink liquids today or eat a light diet.  You may resume a regular diet tomorrow.   ° °General expectations: °Pain for two to three days. °Fingers may become slightly swollen. ° °Call your doctor if any of the following occur: °Severe pain not relieved by pain medication. °Elevated temperature. °Dressing soaked with blood. °Inability to move fingers. °White or bluish color to fingers. ° ° °Post Anesthesia Home Care Instructions ° °Activity: °Get plenty of rest for the remainder of the day. A responsible adult should stay with you for 24 hours following the procedure.  °For the next 24 hours, DO NOT: °-Drive a car °-Operate machinery °-Drink alcoholic beverages °-Take any medication unless instructed by your physician °-Make any legal decisions or sign important papers. ° °Meals: °Start with liquid foods such as gelatin or soup. Progress to regular foods as tolerated. Avoid greasy, spicy, heavy foods. If nausea and/or vomiting occur, drink only clear liquids until the nausea and/or vomiting subsides. Call your physician if vomiting continues. ° °Special Instructions/Symptoms: °Your throat may feel dry or sore from the anesthesia or the breathing tube placed in your throat during surgery. If this causes discomfort, gargle with warm salt water. The discomfort should disappear within 24  hours. ° ° °Regional Anesthesia Blocks ° °1. Numbness or the inability to move the "blocked" extremity may last from 3-48 hours after placement. The length of time depends on the medication injected and your individual response to the medication. If the numbness is not going away after 48 hours, call your surgeon. ° °2. The extremity that is blocked will need to be protected until the numbness is gone and the  Strength has returned. Because you cannot feel it, you will need to take extra care to avoid injury. Because it may be weak, you may have difficulty moving it or using it. You may not know what position it is in without looking at it while the block is in effect. ° °3. For blocks in the legs and feet, returning to weight bearing and walking needs to be done carefully. You will need to wait until the numbness is entirely gone and the strength has returned. You should be able to move your leg and foot normally before you try and bear weight or walk. You will need someone to be with you when you first try to ensure you do not fall and possibly risk injury. ° °4. Bruising and tenderness at the needle site are common side effects and will resolve in a few days. ° °5. Persistent numbness or new problems with movement should be communicated to the surgeon or the [facility] ([phone])/ [facility] ([phone]). °

## 2014-07-06 NOTE — Transfer of Care (Signed)
Immediate Anesthesia Transfer of Care Note  Patient: Gloria [name]  Procedure(s) Performed: Procedure(s): DECOMPRESSION  LEFT ULNAR NERVE (Left)  Patient Location: PACU  Anesthesia Type:General  Level of Consciousness: sedated  Airway & Oxygen Therapy: Patient Spontanous Breathing and Patient connected to face mask oxygen  Post-op Assessment: Report given to RN and Post -op Vital signs reviewed and stable  Post vital signs: Reviewed and stable  Last Vitals:  Filed Vitals:   07/06/14 0935  BP: 151/78  Pulse: 99  Temp: 37 C    Complications: No apparent anesthesia complications

## 2014-07-06 NOTE — Op Note (Signed)
Dictation Number [id number]

## 2014-07-06 NOTE — Anesthesia Postprocedure Evaluation (Signed)
Anesthesia Post Note  Patient: Gloria [name]  Procedure(s) Performed: Procedure(s) (LRB): DECOMPRESSION  LEFT ULNAR NERVE (Left)  Anesthesia type: general  Patient location: PACU  Post pain: Pain level controlled  Post assessment: Patient's Cardiovascular Status Stable  Last Vitals:  Filed Vitals:   07/06/14 1200  BP: 165/90  Pulse: 85  Temp:   Resp: 17    Post vital signs: Reviewed and stable  Level of consciousness: sedated  Complications: No apparent anesthesia complications

## 2014-07-07 ENCOUNTER — Encounter (HOSPITAL_BASED_OUTPATIENT_CLINIC_OR_DEPARTMENT_OTHER): Payer: Self-pay | Admitting: Orthopedic Surgery

## 2014-07-07 NOTE — Op Note (Signed)
NAMEMELEK, POWNALL NO.:  [id number]  MEDICAL RECORD NO.:  [id number]  LOCATION:                                 FACILITY:  PHYSICIAN:  Daryll Brod, M.D.            DATE OF BIRTH:  DATE OF PROCEDURE:  07/06/2014 DATE OF DISCHARGE:                              OPERATIVE REPORT   PREOPERATIVE DIAGNOSIS:  Cubital tunnel syndrome, left elbow.  POSTOPERATIVE DIAGNOSIS:  Cubital tunnel syndrome, left elbow.  OPERATION:  Decompression, left ulnar nerve at the elbow.  SURGEON:  Daryll Brod, MD  ANESTHESIA:  General with supraclavicular block with local infiltration.  ANESTHESIOLOGIST:  Dr. Al Corpus.  HISTORY:  The patient is a 69 year old female with history of status post bilateral carpal tunnel syndrome.  She has had an ulnar nerve decompression on her right side.  She is admitted now for ulnar decompression, possible transposition, ulnar nerve, left side.  At the elbow, she has positive nerve conduction, this has not responded to conservative treatment.  She has had good results with her other surgeries and elected to proceed with decompression, possible transposition, left ulnar nerve.  Pre, peri, and postoperative course have been discussed along with risks and complications.  She is aware that there is no guarantee with the surgery, possibility of infection; recurrence of injury to arteries, nerves, tendons, incomplete relief of symptoms, dystrophy.  In fact that we are attempting to hold the process of compression to the nerve with changes distally that there is no guarantee that this will improve, but hopefully will not worsen.  In preoperative area, the patient was seen, the extremity marked by both patient and surgeon.  Antibiotic given.  PROCEDURE IN DETAIL:  The patient was brought to the operating room, where after supraclavicular block was carried out in preoperative area by Dr. Al Corpus.  She still had some feeling and motion to the arm, although  numbness and tingling was present.  General anesthetic was then carried down in the operating room, and with her in a supine position, time-out taken, confirming the patient and procedure.  She was prepped with ChloraPrep with the left arm free.  The limb was exsanguinated with an Esmarch bandage.  Tourniquet placed on the upper arm was inflated to 250 mmHg.  An incision was then made just posterior to the medial epicondyle, left elbow carried down through subcutaneous tissue. Bleeders were electrocauterized with bipolar.  The dissection was carried down to the posterior aspect of Osborne's fascia.  This was released posteriorly left intact to the medial epicondyle.  The ulnar nerve was identified.  The superficial forearm fascia was then separated from the deep flexor carpi ulnaris fascia and 2 knee retractors placed to allow the fascia of the flexor carpi ulnaris to be split, the dorsal and palmar muscle bellies were then separated with blunt dissection.  A KMI wrist carpal tunnel guide was then placed between the ulnar nerve and the deep fashion using an angled ENT scissors, straight nature of this deep fascia was released for approximately 8 cm distally. Attention was then directed proximally.  The brachial fascia was separated from the subcutaneous tissue, proximally the  2 knee retractors were placed, allowing visualization.  The ulnar nerve was then retracted using the [facility] guide and the brachium was then released leaving the medial intermuscular septum intact up to the level of the ligament of Struthers.  The elbow was then passed through a full range of motion. No subluxation to the nerve was noted.  The wound was copiously irrigated with saline and then injected with 0.25% bupivacaine without epinephrine approximately 10 mL was used.  The Osborne fascia was then repaired to the posterior flap with figure-of-eight 2-0 Vicryl sutures. Subcutaneous tissue was closed with 4-0 Vicryl  and the skin with interrupted 4-0 nylon sutures.  Sterile compressive dressing was applied.  On deflation of the tourniquet, all fingers immediately pinked.  She was taken to the recovery room for observation in satisfactory condition.  She will be discharged home to return to [place] in 1 week on Percocet.          ______________________________ Daryll Brod, M.D.     GK/MEDQ  D:  07/06/2014  T:  07/07/2014  Job:  [id number]

## 2014-08-18 DIAGNOSIS — M542 Cervicalgia: Secondary | ICD-10-CM | POA: Diagnosis not present

## 2014-08-29 DIAGNOSIS — M0609 Rheumatoid arthritis without rheumatoid factor, multiple sites: Secondary | ICD-10-CM | POA: Diagnosis not present

## 2014-08-29 DIAGNOSIS — M6530 Trigger finger, unspecified finger: Secondary | ICD-10-CM | POA: Diagnosis not present

## 2014-08-29 DIAGNOSIS — M5410 Radiculopathy, site unspecified: Secondary | ICD-10-CM | POA: Diagnosis not present

## 2014-08-29 DIAGNOSIS — M79643 Pain in unspecified hand: Secondary | ICD-10-CM | POA: Diagnosis not present

## 2014-09-04 DIAGNOSIS — Z9849 Cataract extraction status, unspecified eye: Secondary | ICD-10-CM | POA: Diagnosis not present

## 2014-09-04 DIAGNOSIS — H52223 Regular astigmatism, bilateral: Secondary | ICD-10-CM | POA: Diagnosis not present

## 2014-09-04 DIAGNOSIS — H524 Presbyopia: Secondary | ICD-10-CM | POA: Diagnosis not present

## 2014-09-04 DIAGNOSIS — H04123 Dry eye syndrome of bilateral lacrimal glands: Secondary | ICD-10-CM | POA: Diagnosis not present

## 2014-09-04 DIAGNOSIS — H5203 Hypermetropia, bilateral: Secondary | ICD-10-CM | POA: Diagnosis not present

## 2014-09-04 DIAGNOSIS — Z961 Presence of intraocular lens: Secondary | ICD-10-CM | POA: Diagnosis not present

## 2014-09-20 DIAGNOSIS — M7541 Impingement syndrome of right shoulder: Secondary | ICD-10-CM | POA: Diagnosis not present

## 2014-10-25 DIAGNOSIS — M25562 Pain in left knee: Secondary | ICD-10-CM | POA: Diagnosis not present

## 2014-10-25 DIAGNOSIS — M7541 Impingement syndrome of right shoulder: Secondary | ICD-10-CM | POA: Diagnosis not present

## 2014-10-25 DIAGNOSIS — M542 Cervicalgia: Secondary | ICD-10-CM | POA: Diagnosis not present

## 2014-10-30 ENCOUNTER — Other Ambulatory Visit: Payer: Self-pay

## 2014-11-13 DIAGNOSIS — M65321 Trigger finger, right index finger: Secondary | ICD-10-CM | POA: Diagnosis not present

## 2014-11-29 DIAGNOSIS — M0609 Rheumatoid arthritis without rheumatoid factor, multiple sites: Secondary | ICD-10-CM | POA: Diagnosis not present

## 2014-11-29 DIAGNOSIS — Z79899 Other long term (current) drug therapy: Secondary | ICD-10-CM | POA: Diagnosis not present

## 2014-11-29 DIAGNOSIS — G5600 Carpal tunnel syndrome, unspecified upper limb: Secondary | ICD-10-CM | POA: Diagnosis not present

## 2014-11-29 DIAGNOSIS — M654 Radial styloid tenosynovitis [de Quervain]: Secondary | ICD-10-CM | POA: Diagnosis not present

## 2014-12-04 DIAGNOSIS — I10 Essential (primary) hypertension: Secondary | ICD-10-CM | POA: Diagnosis not present

## 2014-12-04 DIAGNOSIS — F329 Major depressive disorder, single episode, unspecified: Secondary | ICD-10-CM | POA: Diagnosis not present

## 2014-12-04 DIAGNOSIS — M8588 Other specified disorders of bone density and structure, other site: Secondary | ICD-10-CM | POA: Diagnosis not present

## 2014-12-04 DIAGNOSIS — N183 Chronic kidney disease, stage 3 (moderate): Secondary | ICD-10-CM | POA: Diagnosis not present

## 2014-12-04 DIAGNOSIS — E780 Pure hypercholesterolemia: Secondary | ICD-10-CM | POA: Diagnosis not present

## 2014-12-04 DIAGNOSIS — Z0000 Encounter for general adult medical examination without abnormal findings: Secondary | ICD-10-CM | POA: Diagnosis not present

## 2014-12-04 DIAGNOSIS — F419 Anxiety disorder, unspecified: Secondary | ICD-10-CM | POA: Diagnosis not present

## 2014-12-04 DIAGNOSIS — M069 Rheumatoid arthritis, unspecified: Secondary | ICD-10-CM | POA: Diagnosis not present

## 2014-12-04 DIAGNOSIS — N958 Other specified menopausal and perimenopausal disorders: Secondary | ICD-10-CM | POA: Diagnosis not present

## 2014-12-04 DIAGNOSIS — Z1231 Encounter for screening mammogram for malignant neoplasm of breast: Secondary | ICD-10-CM | POA: Diagnosis not present

## 2014-12-04 DIAGNOSIS — E559 Vitamin D deficiency, unspecified: Secondary | ICD-10-CM | POA: Diagnosis not present

## 2014-12-11 DIAGNOSIS — G5622 Lesion of ulnar nerve, left upper limb: Secondary | ICD-10-CM | POA: Diagnosis not present

## 2014-12-11 DIAGNOSIS — G5621 Lesion of ulnar nerve, right upper limb: Secondary | ICD-10-CM | POA: Diagnosis not present

## 2014-12-18 DIAGNOSIS — Z124 Encounter for screening for malignant neoplasm of cervix: Secondary | ICD-10-CM | POA: Diagnosis not present

## 2014-12-18 DIAGNOSIS — Z1212 Encounter for screening for malignant neoplasm of rectum: Secondary | ICD-10-CM | POA: Diagnosis not present

## 2014-12-18 DIAGNOSIS — Z01419 Encounter for gynecological examination (general) (routine) without abnormal findings: Secondary | ICD-10-CM | POA: Diagnosis not present

## 2014-12-18 DIAGNOSIS — Z6820 Body mass index (BMI) 20.0-20.9, adult: Secondary | ICD-10-CM | POA: Diagnosis not present

## 2015-01-02 DIAGNOSIS — H00024 Hordeolum internum left upper eyelid: Secondary | ICD-10-CM | POA: Diagnosis not present

## 2015-02-02 DIAGNOSIS — Z23 Encounter for immunization: Secondary | ICD-10-CM | POA: Diagnosis not present

## 2015-02-14 DIAGNOSIS — M7712 Lateral epicondylitis, left elbow: Secondary | ICD-10-CM | POA: Diagnosis not present

## 2015-02-14 DIAGNOSIS — M0609 Rheumatoid arthritis without rheumatoid factor, multiple sites: Secondary | ICD-10-CM | POA: Diagnosis not present

## 2015-02-14 DIAGNOSIS — M654 Radial styloid tenosynovitis [de Quervain]: Secondary | ICD-10-CM | POA: Diagnosis not present

## 2015-03-07 DIAGNOSIS — M654 Radial styloid tenosynovitis [de Quervain]: Secondary | ICD-10-CM | POA: Diagnosis not present

## 2015-03-07 DIAGNOSIS — M79601 Pain in right arm: Secondary | ICD-10-CM | POA: Diagnosis not present

## 2015-03-07 DIAGNOSIS — M79602 Pain in left arm: Secondary | ICD-10-CM | POA: Diagnosis not present

## 2015-04-09 DIAGNOSIS — M7711 Lateral epicondylitis, right elbow: Secondary | ICD-10-CM | POA: Insufficient documentation

## 2015-04-09 DIAGNOSIS — D492 Neoplasm of unspecified behavior of bone, soft tissue, and skin: Secondary | ICD-10-CM | POA: Insufficient documentation

## 2015-04-09 DIAGNOSIS — D2112 Benign neoplasm of connective and other soft tissue of left upper limb, including shoulder: Secondary | ICD-10-CM | POA: Insufficient documentation

## 2015-04-19 DIAGNOSIS — R609 Edema, unspecified: Secondary | ICD-10-CM | POA: Diagnosis not present

## 2015-04-19 DIAGNOSIS — R2232 Localized swelling, mass and lump, left upper limb: Secondary | ICD-10-CM | POA: Diagnosis not present

## 2015-04-25 DIAGNOSIS — M65832 Other synovitis and tenosynovitis, left forearm: Secondary | ICD-10-CM | POA: Diagnosis not present

## 2015-05-09 DIAGNOSIS — M65832 Other synovitis and tenosynovitis, left forearm: Secondary | ICD-10-CM | POA: Diagnosis not present

## 2015-05-09 DIAGNOSIS — M7711 Lateral epicondylitis, right elbow: Secondary | ICD-10-CM | POA: Diagnosis not present

## 2015-05-09 DIAGNOSIS — Z79899 Other long term (current) drug therapy: Secondary | ICD-10-CM | POA: Diagnosis not present

## 2015-05-09 DIAGNOSIS — M65321 Trigger finger, right index finger: Secondary | ICD-10-CM | POA: Diagnosis not present

## 2015-05-09 DIAGNOSIS — M0609 Rheumatoid arthritis without rheumatoid factor, multiple sites: Secondary | ICD-10-CM | POA: Diagnosis not present

## 2015-05-09 DIAGNOSIS — M66322 Spontaneous rupture of flexor tendons, left upper arm: Secondary | ICD-10-CM | POA: Diagnosis not present

## 2015-05-09 DIAGNOSIS — M669 Spontaneous rupture of unspecified tendon: Secondary | ICD-10-CM | POA: Diagnosis not present

## 2015-05-09 DIAGNOSIS — IMO0002 Reserved for concepts with insufficient information to code with codable children: Secondary | ICD-10-CM | POA: Insufficient documentation

## 2015-05-09 DIAGNOSIS — D2112 Benign neoplasm of connective and other soft tissue of left upper limb, including shoulder: Secondary | ICD-10-CM | POA: Diagnosis not present

## 2015-05-09 DIAGNOSIS — D492 Neoplasm of unspecified behavior of bone, soft tissue, and skin: Secondary | ICD-10-CM | POA: Diagnosis not present

## 2015-05-31 DIAGNOSIS — F3342 Major depressive disorder, recurrent, in full remission: Secondary | ICD-10-CM | POA: Diagnosis not present

## 2015-05-31 DIAGNOSIS — G4709 Other insomnia: Secondary | ICD-10-CM | POA: Diagnosis not present

## 2015-06-06 DIAGNOSIS — M069 Rheumatoid arthritis, unspecified: Secondary | ICD-10-CM | POA: Diagnosis not present

## 2015-06-06 DIAGNOSIS — E7800 Pure hypercholesterolemia, unspecified: Secondary | ICD-10-CM | POA: Diagnosis not present

## 2015-06-06 DIAGNOSIS — E559 Vitamin D deficiency, unspecified: Secondary | ICD-10-CM | POA: Diagnosis not present

## 2015-06-06 DIAGNOSIS — I10 Essential (primary) hypertension: Secondary | ICD-10-CM | POA: Diagnosis not present

## 2015-06-11 ENCOUNTER — Other Ambulatory Visit: Payer: Self-pay | Admitting: Orthopedic Surgery

## 2015-06-11 ENCOUNTER — Encounter (HOSPITAL_BASED_OUTPATIENT_CLINIC_OR_DEPARTMENT_OTHER): Payer: Self-pay | Admitting: *Deleted

## 2015-06-11 NOTE — Progress Notes (Signed)
Chart reviewed with Dr Al Corpus concerning patient hx of severe shaking after surgery. OK for [facility].

## 2015-06-12 ENCOUNTER — Ambulatory Visit (HOSPITAL_BASED_OUTPATIENT_CLINIC_OR_DEPARTMENT_OTHER): Payer: PPO | Admitting: Anesthesiology

## 2015-06-12 ENCOUNTER — Ambulatory Visit (HOSPITAL_BASED_OUTPATIENT_CLINIC_OR_DEPARTMENT_OTHER)
Admission: RE | Admit: 2015-06-12 | Discharge: 2015-06-12 | Disposition: A | Discharge status: Home / Self Care | Payer: PPO | Source: Ambulatory Visit | Attending: Orthopedic Surgery | Admitting: Orthopedic Surgery

## 2015-06-12 ENCOUNTER — Encounter (HOSPITAL_BASED_OUTPATIENT_CLINIC_OR_DEPARTMENT_OTHER): Payer: Self-pay | Admitting: Anesthesiology

## 2015-06-12 ENCOUNTER — Encounter (HOSPITAL_BASED_OUTPATIENT_CLINIC_OR_DEPARTMENT_OTHER)
Admission: RE | Disposition: A | Discharge status: Home / Self Care | Payer: Self-pay | Source: Ambulatory Visit | Attending: Orthopedic Surgery

## 2015-06-12 DIAGNOSIS — Z79899 Other long term (current) drug therapy: Secondary | ICD-10-CM | POA: Diagnosis not present

## 2015-06-12 DIAGNOSIS — F419 Anxiety disorder, unspecified: Secondary | ICD-10-CM | POA: Diagnosis not present

## 2015-06-12 DIAGNOSIS — R2232 Localized swelling, mass and lump, left upper limb: Secondary | ICD-10-CM | POA: Diagnosis not present

## 2015-06-12 DIAGNOSIS — G5602 Carpal tunnel syndrome, left upper limb: Secondary | ICD-10-CM | POA: Insufficient documentation

## 2015-06-12 DIAGNOSIS — M66822 Spontaneous rupture of other tendons, left upper arm: Secondary | ICD-10-CM | POA: Diagnosis not present

## 2015-06-12 DIAGNOSIS — M1990 Unspecified osteoarthritis, unspecified site: Secondary | ICD-10-CM | POA: Diagnosis not present

## 2015-06-12 DIAGNOSIS — M7522 Bicipital tendinitis, left shoulder: Secondary | ICD-10-CM | POA: Diagnosis not present

## 2015-06-12 DIAGNOSIS — Z7982 Long term (current) use of aspirin: Secondary | ICD-10-CM | POA: Insufficient documentation

## 2015-06-12 DIAGNOSIS — E785 Hyperlipidemia, unspecified: Secondary | ICD-10-CM | POA: Insufficient documentation

## 2015-06-12 DIAGNOSIS — Z90710 Acquired absence of both cervix and uterus: Secondary | ICD-10-CM | POA: Diagnosis not present

## 2015-06-12 DIAGNOSIS — S46212A Strain of muscle, fascia and tendon of other parts of biceps, left arm, initial encounter: Secondary | ICD-10-CM | POA: Insufficient documentation

## 2015-06-12 DIAGNOSIS — M79602 Pain in left arm: Secondary | ICD-10-CM | POA: Diagnosis not present

## 2015-06-12 DIAGNOSIS — G8918 Other acute postprocedural pain: Secondary | ICD-10-CM | POA: Diagnosis not present

## 2015-06-12 DIAGNOSIS — N183 Chronic kidney disease, stage 3 (moderate): Secondary | ICD-10-CM | POA: Insufficient documentation

## 2015-06-12 DIAGNOSIS — I129 Hypertensive chronic kidney disease with stage 1 through stage 4 chronic kidney disease, or unspecified chronic kidney disease: Secondary | ICD-10-CM | POA: Insufficient documentation

## 2015-06-12 DIAGNOSIS — F329 Major depressive disorder, single episode, unspecified: Secondary | ICD-10-CM | POA: Diagnosis not present

## 2015-06-12 DIAGNOSIS — G4700 Insomnia, unspecified: Secondary | ICD-10-CM | POA: Insufficient documentation

## 2015-06-12 DIAGNOSIS — M65822 Other synovitis and tenosynovitis, left upper arm: Secondary | ICD-10-CM | POA: Insufficient documentation

## 2015-06-12 DIAGNOSIS — G5612 Other lesions of median nerve, left upper limb: Secondary | ICD-10-CM | POA: Diagnosis not present

## 2015-06-12 DIAGNOSIS — M65832 Other synovitis and tenosynovitis, left forearm: Secondary | ICD-10-CM | POA: Diagnosis not present

## 2015-06-12 HISTORY — PX: DISTAL BICEPS TENDON REPAIR: SHX1461

## 2015-06-12 SURGERY — REPAIR, TENDON, BICEPS, DISTAL
Anesthesia: General | Site: Elbow | Laterality: Left

## 2015-06-12 MED ORDER — FENTANYL CITRATE (PF) 100 MCG/2ML IJ SOLN
50.0000 ug | INTRAMUSCULAR | Status: DC | PRN
  Administered 2015-06-12: 100 ug via INTRAVENOUS

## 2015-06-12 MED ORDER — PHENYLEPHRINE HCL 10 MG/ML IJ SOLN
20.0000 mg | INTRAMUSCULAR | Status: DC | PRN
  Administered 2015-06-12: 50 ug/min via INTRAVENOUS

## 2015-06-12 MED ORDER — CEFAZOLIN SODIUM-DEXTROSE 2-3 GM-% IV SOLR
INTRAVENOUS | Status: AC
  Filled 2015-06-12: qty 50

## 2015-06-12 MED ORDER — EPHEDRINE SULFATE 50 MG/ML IJ SOLN
INTRAMUSCULAR | Status: AC
  Filled 2015-06-12: qty 1

## 2015-06-12 MED ORDER — CHLORHEXIDINE GLUCONATE 4 % EX LIQD: 60.0000 mL | Freq: Once | CUTANEOUS | Status: DC

## 2015-06-12 MED ORDER — FENTANYL CITRATE (PF) 100 MCG/2ML IJ SOLN
INTRAMUSCULAR | Status: AC
  Filled 2015-06-12: qty 2

## 2015-06-12 MED ORDER — MIDAZOLAM HCL 2 MG/2ML IJ SOLN
INTRAMUSCULAR | Status: AC
  Filled 2015-06-12: qty 2

## 2015-06-12 MED ORDER — 0.9 % SODIUM CHLORIDE (POUR BTL) OPTIME
TOPICAL | Status: DC | PRN
  Administered 2015-06-12: 200 mL

## 2015-06-12 MED ORDER — HYDROMORPHONE HCL 1 MG/ML IJ SOLN: 0.2500 mg | INTRAMUSCULAR | Status: DC | PRN

## 2015-06-12 MED ORDER — ACETAMINOPHEN 10 MG/ML IV SOLN
INTRAVENOUS | Status: AC
  Filled 2015-06-12: qty 100

## 2015-06-12 MED ORDER — OXYCODONE-ACETAMINOPHEN 10-325 MG PO TABS: 1.0000 | ORAL_TABLET | ORAL | Status: DC | PRN

## 2015-06-12 MED ORDER — PROPOFOL 10 MG/ML IV BOLUS
INTRAVENOUS | Status: AC
  Filled 2015-06-12: qty 40

## 2015-06-12 MED ORDER — DEXAMETHASONE SODIUM PHOSPHATE 10 MG/ML IJ SOLN
INTRAMUSCULAR | Status: AC
  Filled 2015-06-12: qty 1

## 2015-06-12 MED ORDER — FENTANYL CITRATE (PF) 100 MCG/2ML IJ SOLN
INTRAMUSCULAR | Status: DC | PRN
Start: 2015-06-12 — End: 2015-06-12
  Administered 2015-06-12: 100 ug via INTRAVENOUS

## 2015-06-12 MED ORDER — ACETAMINOPHEN 10 MG/ML IV SOLN
INTRAVENOUS | Status: DC | PRN
  Administered 2015-06-12: 1000 mg via INTRAVENOUS

## 2015-06-12 MED ORDER — GLYCOPYRROLATE 0.2 MG/ML IJ SOLN
INTRAMUSCULAR | Status: AC
  Filled 2015-06-12: qty 1

## 2015-06-12 MED ORDER — ATROPINE SULFATE 0.4 MG/ML IJ SOLN
INTRAMUSCULAR | Status: AC
  Filled 2015-06-12: qty 1

## 2015-06-12 MED ORDER — PHENYLEPHRINE HCL 10 MG/ML IJ SOLN
INTRAMUSCULAR | Status: AC
  Filled 2015-06-12: qty 1

## 2015-06-12 MED ORDER — CEFAZOLIN SODIUM-DEXTROSE 2-3 GM-% IV SOLR
2.0000 g | INTRAVENOUS | Status: AC
  Administered 2015-06-12: 2 g via INTRAVENOUS

## 2015-06-12 MED ORDER — PHENYLEPHRINE HCL 10 MG/ML IJ SOLN
INTRAMUSCULAR | Status: DC | PRN
  Administered 2015-06-12: 40 ug via INTRAVENOUS
  Administered 2015-06-12: 80 ug via INTRAVENOUS

## 2015-06-12 MED ORDER — LIDOCAINE HCL (CARDIAC) 20 MG/ML IV SOLN
INTRAVENOUS | Status: AC
  Filled 2015-06-12: qty 5

## 2015-06-12 MED ORDER — SUCCINYLCHOLINE CHLORIDE 20 MG/ML IJ SOLN
INTRAMUSCULAR | Status: AC
  Filled 2015-06-12: qty 1

## 2015-06-12 MED ORDER — MIDAZOLAM HCL 2 MG/2ML IJ SOLN: 1.0000 mg | INTRAMUSCULAR | Status: DC | PRN

## 2015-06-12 MED ORDER — BUPIVACAINE-EPINEPHRINE (PF) 0.5% -1:200000 IJ SOLN
INTRAMUSCULAR | Status: DC | PRN
  Administered 2015-06-12: 30 mL via PERINEURAL

## 2015-06-12 MED ORDER — SCOPOLAMINE 1 MG/3DAYS TD PT72: 1.0000 | MEDICATED_PATCH | Freq: Once | TRANSDERMAL | Status: DC | PRN

## 2015-06-12 MED ORDER — ONDANSETRON HCL 4 MG/2ML IJ SOLN
INTRAMUSCULAR | Status: AC
  Filled 2015-06-12: qty 2

## 2015-06-12 MED ORDER — GLYCOPYRROLATE 0.2 MG/ML IJ SOLN: 0.2000 mg | Freq: Once | INTRAMUSCULAR | Status: DC | PRN

## 2015-06-12 MED ORDER — LACTATED RINGERS IV SOLN
INTRAVENOUS | Status: DC
  Administered 2015-06-12: 10 mL/h via INTRAVENOUS
  Administered 2015-06-12 (×2): via INTRAVENOUS

## 2015-06-12 SURGICAL SUPPLY — 79 items
ANCH SUT 12 BICEPS BTN STRL (Orthopedic Implant) ×2 IMPLANT
BLADE MINI RND TIP GREEN BEAV (BLADE) IMPLANT
BLADE SURG 15 STRL LF DISP TIS (BLADE) ×2 IMPLANT
BLADE SURG 15 STRL SS (BLADE) ×3
BNDG CMPR 9X4 STRL LF SNTH (GAUZE/BANDAGES/DRESSINGS) ×2
BNDG COHESIVE 3X5 TAN STRL LF (GAUZE/BANDAGES/DRESSINGS) ×6 IMPLANT
BNDG ESMARK 4X9 LF (GAUZE/BANDAGES/DRESSINGS) ×3 IMPLANT
BNDG GAUZE ELAST 4 BULKY (GAUZE/BANDAGES/DRESSINGS) ×3 IMPLANT
BUTTON DISTAL BICEPS (Orthopedic Implant) ×2 IMPLANT
CHLORAPREP W/TINT 26ML (MISCELLANEOUS) ×3 IMPLANT
CORDS BIPOLAR (ELECTRODE) ×3 IMPLANT
COVER BACK TABLE 60X90IN (DRAPES) ×3 IMPLANT
COVER MAYO STAND STRL (DRAPES) ×3 IMPLANT
CUFF TOURN SGL LL 18 NRW (TOURNIQUET CUFF) ×4 IMPLANT
CUFF TOURNIQUET SINGLE 18IN (TOURNIQUET CUFF) IMPLANT
DECANTER SPIKE VIAL GLASS SM (MISCELLANEOUS) IMPLANT
DRAPE EXTREMITY T 121X128X90 (DRAPE) ×3 IMPLANT
DRAPE OEC MINIVIEW 54X84 (DRAPES) ×3 IMPLANT
DRAPE SURG 17X23 STRL (DRAPES) ×3 IMPLANT
DRAPE U 20/CS (DRAPES) ×3 IMPLANT
DRSG PAD ABDOMINAL 8X10 ST (GAUZE/BANDAGES/DRESSINGS) ×3 IMPLANT
GAUZE SPONGE 4X4 12PLY STRL (GAUZE/BANDAGES/DRESSINGS) ×3 IMPLANT
GAUZE SPONGE 4X4 16PLY XRAY LF (GAUZE/BANDAGES/DRESSINGS) IMPLANT
GAUZE XEROFORM 1X8 LF (GAUZE/BANDAGES/DRESSINGS) ×3 IMPLANT
GLOVE BIO SURGEON STRL SZ7.5 (GLOVE) ×2 IMPLANT
GLOVE BIO SURGEON STRL SZ8.5 (GLOVE) IMPLANT
GLOVE BIOGEL PI IND STRL 7.0 (GLOVE) ×2 IMPLANT
GLOVE BIOGEL PI IND STRL 8 (GLOVE) ×1 IMPLANT
GLOVE BIOGEL PI IND STRL 8.5 (GLOVE) ×2 IMPLANT
GLOVE BIOGEL PI INDICATOR 7.0 (GLOVE) ×2
GLOVE BIOGEL PI INDICATOR 8 (GLOVE) ×1
GLOVE BIOGEL PI INDICATOR 8.5 (GLOVE) ×1
GLOVE ECLIPSE 6.5 STRL STRAW (GLOVE) ×4 IMPLANT
GLOVE SURG ORTHO 8.0 STRL STRW (GLOVE) ×3 IMPLANT
GOWN STRL REUS W/ TWL LRG LVL3 (GOWN DISPOSABLE) ×3 IMPLANT
GOWN STRL REUS W/TWL LRG LVL3 (GOWN DISPOSABLE) ×6
GOWN STRL REUS W/TWL XL LVL3 (GOWN DISPOSABLE) ×6 IMPLANT
INSERTER BUTTON (SYSTAGENIX WOUND MANAGEMENT) ×2 IMPLANT
LOOP VESSEL MAXI BLUE (MISCELLANEOUS) IMPLANT
NDL 1/2 CIR CATGUT .05X1.09 (NEEDLE) IMPLANT
NDL ADDISON D1/2 CIR (NEEDLE) IMPLANT
NDL PRECISIONGLIDE 27X1.5 (NEEDLE) IMPLANT
NDL SUT 6 .5 CRC .975X.05 MAYO (NEEDLE) ×2 IMPLANT
NEEDLE 1/2 CIR CATGUT .05X1.09 (NEEDLE) IMPLANT
NEEDLE ADDISON D1/2 CIR (NEEDLE) IMPLANT
NEEDLE MAYO TAPER (NEEDLE) ×3
NEEDLE PRECISIONGLIDE 27X1.5 (NEEDLE) IMPLANT
NS IRRIG 1000ML POUR BTL (IV SOLUTION) ×3 IMPLANT
PACK BASIN DAY SURGERY FS (CUSTOM PROCEDURE TRAY) ×3 IMPLANT
PAD CAST 4YDX4 CTTN HI CHSV (CAST SUPPLIES) ×4 IMPLANT
PADDING CAST ABS 3INX4YD NS (CAST SUPPLIES) ×1
PADDING CAST ABS 4INX4YD NS (CAST SUPPLIES) ×1
PADDING CAST ABS COTTON 3X4 (CAST SUPPLIES) ×2 IMPLANT
PADDING CAST ABS COTTON 4X4 ST (CAST SUPPLIES) ×2 IMPLANT
PADDING CAST COTTON 4X4 STRL (CAST SUPPLIES) ×6
PIN DRILL ACL TIGHTROPE 4MM (PIN) ×2 IMPLANT
SLEEVE SCD COMPRESS KNEE MED (MISCELLANEOUS) ×3 IMPLANT
SPLINT PLASTER CAST XFAST 3X15 (CAST SUPPLIES) ×60 IMPLANT
SPLINT PLASTER XTRA FASTSET 3X (CAST SUPPLIES) ×30
SPONGE LAP 4X18 X RAY DECT (DISPOSABLE) ×1 IMPLANT
STOCKINETTE 4X48 STRL (DRAPES) ×3 IMPLANT
SUT 2 FIBERLOOP 20 STRT BLUE (SUTURE) ×6
SUT ETHIBOND 0 MO6 C/R (SUTURE) IMPLANT
SUT ETHILON 4 0 PS 2 18 (SUTURE) ×5 IMPLANT
SUT FIBERWIRE #2 38 T-5 BLUE (SUTURE)
SUT MERSILENE 3 0 FS 1 (SUTURE) IMPLANT
SUT PDS AB 2-0 CT2 27 (SUTURE) IMPLANT
SUT SILK 2 0 SH (SUTURE) ×1 IMPLANT
SUT VIC AB 2-0 SH 18 (SUTURE) ×5 IMPLANT
SUT VIC AB 4-0 BRD 54 (SUTURE) IMPLANT
SUT VIC AB 4-0 P-3 18XBRD (SUTURE) IMPLANT
SUT VIC AB 4-0 P3 18 (SUTURE)
SUT VICRYL 4-0 PS2 18IN ABS (SUTURE) ×1 IMPLANT
SUTURE 2 FIBERLOOP 20 STRT BLU (SUTURE) ×3 IMPLANT
SUTURE FIBERWR #2 38 T-5 BLUE (SUTURE) IMPLANT
SYR BULB 3OZ (MISCELLANEOUS) ×3 IMPLANT
SYR CONTROL 10ML LL (SYRINGE) IMPLANT
TOWEL OR 17X24 6PK STRL BLUE (TOWEL DISPOSABLE) ×3 IMPLANT
UNDERPAD 30X30 (UNDERPADS AND DIAPERS) ×3 IMPLANT

## 2015-06-12 NOTE — Addendum Note (Signed)
Addendum  created 06/12/15 2044 by Duane Boston, MD   Modules edited: Clinical Notes   Clinical Notes:  File: [id number]

## 2015-06-12 NOTE — Anesthesia Preprocedure Evaluation (Addendum)
Anesthesia Evaluation  Patient identified by MRN, date of birth, ID band Patient awake    Reviewed: Allergy & Precautions, NPO status , Patient's Chart, lab work & pertinent test results  History of Anesthesia Complications (+) history of anesthetic complications  Airway Mallampati: I  TM Distance: >3 FB Neck ROM: Full    Dental  (+) Teeth Intact, Dental Advisory Given, Caps   Pulmonary    breath sounds clear to auscultation       Cardiovascular hypertension, Pt. on medications  Rhythm:Regular Rate:Normal     Neuro/Psych PSYCHIATRIC DISORDERS Anxiety Depression    GI/Hepatic negative GI ROS, Neg liver ROS,   Endo/Other  negative endocrine ROS  Renal/GU Renal InsufficiencyRenal disease     Musculoskeletal   Abdominal   Peds  Hematology negative hematology ROS (+)   Anesthesia Other Findings   Reproductive/Obstetrics                            Anesthesia Physical  Anesthesia Plan  ASA: II  Anesthesia Plan: General   Post-op Pain Management: MAC Combined w/ Regional for Post-op pain   Induction: Inhalational  Airway Management Planned: LMA  Additional Equipment:   Intra-op Plan:   Post-operative Plan: Extubation in OR  Informed Consent: I have reviewed the patients History and Physical, chart, labs and discussed the procedure including the risks, benefits and alternatives for the proposed anesthesia with the patient or authorized representative who has indicated his/her understanding and acceptance.   Dental advisory given  Plan Discussed with: Anesthesiologist, CRNA and Surgeon  Anesthesia Plan Comments: (Pt had regional block with inhalational induction without versed or propofol for last anesthetic and did not have any reaction.  She would prefer the same anesthetic today.)       Anesthesia Quick Evaluation

## 2015-06-12 NOTE — Brief Op Note (Signed)
06/12/2015  3:22 PM  PATIENT:  Gloria [name]  70 y.o. female  PRE-OPERATIVE DIAGNOSIS:  LEFT BICEPS TENDON RUPTURE,MASS WITH EXTENSIVE SYNOVITIS OVERLYING MEDIAN NERVE LEFT ELBOW  POST-OPERATIVE DIAGNOSIS:  LEFT BICEPS TENDON RUPTURE,MASS WITH EXTENSIVE SYNOVITIS OVERLYING MEDIAN NERVE LEFT ELBOW  PROCEDURE:  Procedure(s): REPAIR LEFT BICEPS TENDON AND DECOMPRESSION OF MEDIAN NERVE (Left)  SURGEON:  Surgeon(s) and Role:    * Daryll Brod, MD - Primary    * Leanora Cover, MD - Assisting  PHYSICIAN ASSISTANT:   ASSISTANTS: K Sephora Boyar,MD   ANESTHESIA:   regional and general  EBL:  Total I/O In: 1800 [I.V.:1800] Out: -   BLOOD ADMINISTERED:none  DRAINS: none   LOCAL MEDICATIONS USED:  NONE  SPECIMEN:  excision  DISPOSITION OF SPECIMEN:  PATHOLOGY  COUNTS:  YES  TOURNIQUET:   Total Tourniquet Time Documented: Upper Arm (Left) - 83 minutes Total: Upper Arm (Left) - 83 minutes   DICTATION: .Other Dictation: Dictation Number [id number]  PLAN OF CARE: Discharge to home after PACU  PATIENT DISPOSITION:  PACU - hemodynamically stable.

## 2015-06-12 NOTE — H&P (Signed)
Gloria [name] is an 70 y.o. female.   Chief Complaint: mass left elbow HPI: Gloria [name] is a 70 yo female. She is complaining of discomfort over the lateral side of her right elbow. This has been going on for the past several months. She recalls no specific history of injury. She has not taken anything for this. She is also complaining of a fullness in the antecubital fossa of her left elbow. This has been going on for past two to three months. She recalls no history of injury. She states it is painful giving her feeling of tendinitis as she uses the arm on that side.This is over the central aspect. MRI reveals that she has had a bicipital tendinitis/tendinosis. It appears to be intact, but has significant fluid around it. She has a significant increase in swelling in the antecubital fossa and is now having median nerve symptoms.      Past Medical History  Diagnosis Date  . Hypertension   . Hyperlipemia   . Depression   . Anxiety   . Arthritis   . Insomnia   . CKD (chronic kidney disease), stage III   . Complication of anesthesia 05/2014    had body shaking-observed over night-see anesthesia note-has had tremors post op in past    Past Surgical History  Procedure Laterality Date  . Tonsillectomy      age 39  . Abdominal hysterectomy      age 65  . Trigger finger release  2/12    rt middle  . Trigger finger release  2010    lt middle  . Small intestine surgery    . Small intestine surgery      has had 6 total bowel obstructions -mostlt adhesions-last 2009  . Carpal tunnel release  01/20/2012    Procedure: CARPAL TUNNEL RELEASE;  Surgeon: Wynonia Sours, MD;  Location: [place];  Service: Orthopedics;  Laterality: Right;  . Carpal tunnel release  02/25/2012    Procedure: CARPAL TUNNEL RELEASE;  Surgeon: Wynonia Sours, MD;  Location: [place];  Service: Orthopedics;  Laterality: Left;  RELEASE A-1 PULLEY LRF  . Back surgery    . Ulnar nerve transposition  Right 05/11/2014    Procedure: DECOMPRESSION ULNAR NERVE RIGHT ELBOW ;  Surgeon: Daryll Brod, MD;  Location: [place];  Service: Orthopedics;  Laterality: Right;  . Ulnar nerve transposition Left 07/06/2014    Procedure: DECOMPRESSION  LEFT ULNAR NERVE;  Surgeon: Daryll Brod, MD;  Location: [place];  Service: Orthopedics;  Laterality: Left;    History reviewed. No pertinent family history. Social History:  reports that she has never smoked. She does not have any smokeless tobacco history on file. She reports that she does not drink alcohol or use illicit drugs.  Allergies:  Allergies  Allergen Reactions  . Versed [Midazolam] Other (See Comments)  . Diprivan [Propofol] Other (See Comments)    Uncontrolled shaking    Medications Prior to Admission  Medication Sig Dispense Refill  . aspirin 81 MG tablet Take 81 mg by mouth daily.    Marland Kitchen buPROPion (WELLBUTRIN) 75 MG tablet Take 100 mg by mouth 3 (three) times daily.     . hydroxychloroquine (PLAQUENIL) 200 MG tablet Take 200 mg by mouth daily.    Marland Kitchen lisinopril-hydrochlorothiazide (PRINZIDE,ZESTORETIC) 20-12.5 MG per tablet Take 1 tablet by mouth every evening.    Marland Kitchen PARoxetine (PAXIL) 10 MG tablet Take 10 mg by mouth daily.    Marland Kitchen  polyethylene glycol (MIRALAX / GLYCOLAX) packet Take 17 g by mouth daily.    . potassium chloride (K-DUR,KLOR-CON) 10 MEQ tablet Take 10 mEq by mouth daily.    . traZODone (DESYREL) 50 MG tablet Take 150 mg by mouth at bedtime.    . cholecalciferol (VITAMIN D) 1000 UNITS tablet Take 1,000 Units by mouth daily.    Marland Kitchen conjugated estrogens (PREMARIN) vaginal cream Place 1 g vaginally 2 (two) times a week.      No results found for this or any previous visit (from the past 48 hour(s)).  No results found.   Pertinent items are noted in HPI.  Blood pressure 137/66, pulse 80, temperature 98.3 F (36.8 C), resp. rate 20, height 5\' 6"  (1.676 m), weight 57.607 kg (127 lb), SpO2 100  %.  General appearance: alert, cooperative and appears stated age Head: Normocephalic, without obvious abnormality Neck: no JVD Resp: clear to auscultation bilaterally Cardio: regular rate and rhythm, S1, S2 normal, no murmur, click, rub or gallop GI: soft, non-tender; bowel sounds normal; no masses,  no organomegaly Extremities: swelling left antecubital fossa Pulses: 2+ and symmetric Skin: Skin color, texture, turgor normal. No rashes or lesions Neurologic: Grossly normal Incision/Wound: na  Assessment/Plan Diagnosis: biceps tendinosis left She would like to proceed to have this repaired. She does not have palmaris longus. We will proceed with a repair of her biceps and possible tendon allograft suplimentation.    Mustapha Colson R 06/12/2015, 12:53 PM

## 2015-06-12 NOTE — Anesthesia Postprocedure Evaluation (Addendum)
Anesthesia Post Note  Patient: Gloria [name]  Procedure(s) Performed: Procedure(s) (LRB): REPAIR LEFT BICEPS TENDON AND DECOMPRESSION OF MEDIAN NERVE (Left)  Patient location during evaluation: PACU Anesthesia Type: General Level of consciousness: sedated Pain management: pain level controlled Vital Signs Assessment: post-procedure vital signs reviewed and stable Respiratory status: spontaneous breathing and respiratory function stable Cardiovascular status: stable Anesthetic complications: no Comments: No abnormal shaking or leg movement was noted during recovery.    Last Vitals:  Filed Vitals:   06/12/15 1630 06/12/15 1642  BP: 149/72 149/74  Pulse: 81   Temp:  36.5 C  Resp: 12 14    Last Pain:  Filed Vitals:   06/12/15 1646  PainSc: 0-No pain                 Midge Momon DANIEL

## 2015-06-12 NOTE — Progress Notes (Signed)
Assisted Dr. Singer with left, ultrasound guided, supraclavicular block. Side rails up, monitors on throughout procedure. See vital signs in flow sheet. Tolerated Procedure well. 

## 2015-06-12 NOTE — Op Note (Addendum)
Dictation Number [id number] Intra-operative fluoroscopic images in the AP, lateral, and oblique views were taken and evaluated by myself.  Reduction and hardware placement were confirmed.

## 2015-06-12 NOTE — Anesthesia Procedure Notes (Addendum)
Anesthesia Regional Block:  Supraclavicular block  Pre-Anesthetic Checklist: ,, timeout performed, Correct Patient, Correct Site, Correct Laterality, Correct Procedure,, site marked, risks and benefits discussed, Surgical consent,  Pre-op evaluation,  At surgeon's request and post-op pain management  Laterality: Left  Prep: chloraprep       Needles:  Injection technique: Single-shot  Needle Type: Echogenic Stimulator Needle     Needle Length: 5cm 5 cm Needle Gauge: 22 and 22 G    Additional Needles:  Procedures: ultrasound guided (picture in chart) and nerve stimulator Supraclavicular block  Nerve Stimulator or Paresthesia:  Response: bicep contraction, 0.48 mA,   Additional Responses:   Narrative:  Start time: 06/12/2015 12:34 PM End time: 06/12/2015 12:44 PM Injection made incrementally with aspirations every 5 mL.  Performed by: Personally   Additional Notes: Functioning IV was confirmed and monitors applied.  A 80mm 22ga echogenic arrow stimulator was used. Sterile prep and drape,hand hygiene and sterile gloves were used.Ultrasound guidance: relevent anatomy identified, needle position confirmed, local anesthetic spread visualized around nerve(s)., vascular puncture avoided.  Image printed for medical record.  Negative aspiration and negative test dose prior to incremental administration of local anesthetic. The patient tolerated the procedure well.   Procedure Name: LMA Insertion Date/Time: 06/12/2015 1:32 PM Performed by: Toula Moos L Pre-anesthesia Checklist: Patient identified, Emergency Drugs available, Suction available, Patient being monitored and Timeout performed Patient Re-evaluated:Patient Re-evaluated prior to inductionOxygen Delivery Method: Circle System Utilized Preoxygenation: Pre-oxygenation with 100% oxygen Intubation Type: IV induction Ventilation: Mask ventilation without difficulty LMA: LMA inserted LMA Size: 4.0 Number of attempts: 1 Airway  Equipment and Method: Bite block Placement Confirmation: positive ETCO2 Tube secured with: Tape Dental Injury: Teeth and Oropharynx as per pre-operative assessment

## 2015-06-12 NOTE — Transfer of Care (Signed)
Immediate Anesthesia Transfer of Care Note  Patient: Gloria [name]  Procedure(s) Performed: Procedure(s): REPAIR LEFT BICEPS TENDON AND DECOMPRESSION OF MEDIAN NERVE (Left)  Patient Location: PACU  Anesthesia Type:General  Level of Consciousness: sedated  Airway & Oxygen Therapy: Patient Spontanous Breathing and Patient connected to face mask oxygen  Post-op Assessment: Report given to RN and Post -op Vital signs reviewed and stable  Post vital signs: Reviewed and stable  Last Vitals:  Filed Vitals:   06/12/15 1258 06/12/15 1259  BP:    Pulse: 83 84  Temp:    Resp: 13 17    Complications: No apparent anesthesia complications

## 2015-06-12 NOTE — Discharge Instructions (Addendum)

## 2015-06-13 NOTE — Op Note (Signed)
Gloria [name], Gloria [name].:  [id number]  MEDICAL RECORD NO.:  [id number]  LOCATION:                                 FACILITY:  PHYSICIAN:  Daryll Brod, M.D.            DATE OF BIRTH:  DATE OF PROCEDURE:  06/12/2015 DATE OF DISCHARGE:                              OPERATIVE REPORT   PREOPERATIVE DIAGNOSIS:  Biceps tendonitis with mass, forearm; compression of median nerve, left elbow.  POSTOPERATIVE DIAGNOSIS:  Biceps tendonitis with mass, forearm; compression of median nerve, left elbow.  OPERATION:  Repair of rupture biceps tendon, left elbow decompression of median nerve by excision of large synovial mass, left elbow.  SURGEON:  Daryll Brod, M.D.  ASSISTANT:  Leanora Cover, MD.  ANESTHESIA:  Supraclavicular block general.  ANESTHESIOLOGIST:  Nelda Severe. [name], M.D.  HISTORY:  The patient is a 70 year old female with a history of a mass in the anterior antecubital fossa of her left elbow.  She is complaining of median nerve symptoms.  MRI reveals a large fluid filled mass surrounding the insertion of the biceps tendon distally with partial rupture.  She is admitted for repair of the biceps, possible grafting with decompression to the nerve by excision of the mass.  Pre, peri, and postoperative courses have been discussed along with risks and complications.  She is aware that there is no guarantee with the surgery; possibility of infection; recurrence of injury to arteries, nerves, tendons; incomplete relief of symptoms; dystrophy.  In preoperative area, the patient is seen, the extremity marked by both patient and surgeon.  Antibiotic given.  PROCEDURE IN DETAIL:  The patient was brought to the operating room, where a general anesthetic was carried out without difficulty.  She had been given a supraclavicular block in the preoperative area under the direction of Dr. [name].  She was prepped using ChloraPrep, supine position with the left arm free.  A  3-minute dry time was allowed.  Time- out taken, confirming the patient and procedure.  The limb was exsanguinated with an Esmarch bandage.  Tourniquet placed high on the arm was inflated to 250 mmHg.  A transverse incision was made distal to the antecubital foss of the right elbow 2 finger breadths distal to the flexion crease and carried down through subcutaneous tissue.  Bleeders were tied with 2-0 Vicryl sutures.  The lateral antebrachial cutaneous nerve of the forearm was identified, this was found to be splaying over the entire area of the incision.  This was protected as much as possible.  The dissection was carried down to the biceps tendon which was identified encased in a large synovial and thick surrounding of tenosynovial tissue extending the entire length from the musculotendinous junction down to the distal radius.  The median nerve was identified proximally, followed through the area decompressing it.The mass was removed decompressing the median nerve through the  the antecubital fossa and proximal forearm.  This was done with sharp dissection.  Using scissors, rongeurs, and elevators to dissect the synovial tissue away from the median nerve and the biceps tendon tuberosity. The specimen was sent to Pathology.  The biceps tendon was found to be ruptured off from the tubercle of the radius. This had scarred to the medial musculature with a portion was still intact, but was primarily scar.  A very significant infiltration of the tenosynovial tissue was noted into the tendon.  The remaining portion was taken off the bone revealing a bare area of the tubercle.  This was confirmed with AP and lateral radiographs for anticipated placement of a RetroButton for fixation of the biceps tendon.  The drill hole was then made into the biceps tubercle through 1 cortex, the opening of the medullary canal was enlarged with a House curette.  The biceps tendon was then sutured with a Krackow  suture using 2-0 FiberLoop.  This was then passed through the RetroButton.  The RetroButton was then placed into the hole in the radial tubercle and turned so as to fix it against the cortex.  This was done after irrigating any bone chips away.  X-rays in the AP and lateral direction confirmed that the RetroButton had deployed.  The tendon was sutured to it, fixing it with several figure-of-eight sutures of the RetroButton suture through the more proximal tendon.  This was able to be done with the elbow fully extended.  The wounds were again irrigated.  Bleeders were re-tied with 2-0 Vicryl sutures.  The subcutaneous tissue was closed with interrupted 2-0 Vicryl, and the skin with interrupted 4-0 nylon sutures.  A sterile compressive dressing, long-arm splint applied. On deflation of the tourniquet, all fingers immediately pinked.  She was taken to the recovery room for observation in satisfactory condition. She will be discharged home to return to the [place] in 1 week, on Percocet.          ______________________________ Daryll Brod, M.D.     GK/MEDQ  D:  06/12/2015  T:  06/12/2015  Job:  [id number]

## 2015-06-14 ENCOUNTER — Encounter (HOSPITAL_BASED_OUTPATIENT_CLINIC_OR_DEPARTMENT_OTHER): Payer: Self-pay | Admitting: Orthopedic Surgery

## 2015-06-20 DIAGNOSIS — M66322 Spontaneous rupture of flexor tendons, left upper arm: Secondary | ICD-10-CM | POA: Diagnosis not present

## 2015-06-20 DIAGNOSIS — G5612 Other lesions of median nerve, left upper limb: Secondary | ICD-10-CM | POA: Diagnosis not present

## 2015-06-25 DIAGNOSIS — S46112A Strain of muscle, fascia and tendon of long head of biceps, left arm, initial encounter: Secondary | ICD-10-CM | POA: Diagnosis not present

## 2015-06-27 NOTE — Op Note (Signed)
I assisted Surgeon(s) and Role:    * Daryll Brod, MD - Primary    * Leanora Cover, MD - Assisting on the Procedure(s): REPAIR LEFT BICEPS TENDON AND DECOMPRESSION OF MEDIAN NERVE on 06/12/2015.  I provided assistance on this case as follows: retraction of soft tissues and neurovascular structures, stabilization of the tendon for passing of suture, identification of biceps tuberosity.  Electronically signed by: Tennis Must, MD Date: 06/27/2015 Time: 4:42 PM

## 2015-07-02 LAB — POCT I-STAT, CHEM 8
BUN: 15 mg/dL (ref 6–20)
CALCIUM ION: 1.15 mmol/L (ref 1.13–1.30)
CREATININE: 1 mg/dL (ref 0.44–1.00)
Chloride: 105 mmol/L (ref 101–111)
GLUCOSE: 89 mg/dL (ref 65–99)
HCT: 40 % (ref 36.0–46.0)
Hemoglobin: 13.6 g/dL (ref 12.0–15.0)
Potassium: 3.6 mmol/L (ref 3.5–5.1)
SODIUM: 145 mmol/L (ref 135–145)
TCO2: 25 mmol/L (ref 0–100)

## 2015-07-04 DIAGNOSIS — S46112D Strain of muscle, fascia and tendon of long head of biceps, left arm, subsequent encounter: Secondary | ICD-10-CM | POA: Diagnosis not present

## 2015-07-11 DIAGNOSIS — S46112D Strain of muscle, fascia and tendon of long head of biceps, left arm, subsequent encounter: Secondary | ICD-10-CM | POA: Diagnosis not present

## 2015-07-16 DIAGNOSIS — M25561 Pain in right knee: Secondary | ICD-10-CM | POA: Diagnosis not present

## 2015-07-16 DIAGNOSIS — M25511 Pain in right shoulder: Secondary | ICD-10-CM | POA: Diagnosis not present

## 2015-07-16 DIAGNOSIS — G5612 Other lesions of median nerve, left upper limb: Secondary | ICD-10-CM | POA: Insufficient documentation

## 2015-07-18 DIAGNOSIS — S46112D Strain of muscle, fascia and tendon of long head of biceps, left arm, subsequent encounter: Secondary | ICD-10-CM | POA: Diagnosis not present

## 2015-07-25 DIAGNOSIS — S46112D Strain of muscle, fascia and tendon of long head of biceps, left arm, subsequent encounter: Secondary | ICD-10-CM | POA: Diagnosis not present

## 2015-08-01 DIAGNOSIS — S46212D Strain of muscle, fascia and tendon of other parts of biceps, left arm, subsequent encounter: Secondary | ICD-10-CM | POA: Diagnosis not present

## 2015-08-08 DIAGNOSIS — S46212A Strain of muscle, fascia and tendon of other parts of biceps, left arm, initial encounter: Secondary | ICD-10-CM | POA: Diagnosis not present

## 2015-08-14 DIAGNOSIS — M25511 Pain in right shoulder: Secondary | ICD-10-CM | POA: Diagnosis not present

## 2015-08-14 DIAGNOSIS — M25561 Pain in right knee: Secondary | ICD-10-CM | POA: Diagnosis not present

## 2015-08-15 DIAGNOSIS — S46212D Strain of muscle, fascia and tendon of other parts of biceps, left arm, subsequent encounter: Secondary | ICD-10-CM | POA: Diagnosis not present

## 2015-08-22 DIAGNOSIS — H01005 Unspecified blepharitis left lower eyelid: Secondary | ICD-10-CM | POA: Diagnosis not present

## 2015-08-22 DIAGNOSIS — S46212D Strain of muscle, fascia and tendon of other parts of biceps, left arm, subsequent encounter: Secondary | ICD-10-CM | POA: Diagnosis not present

## 2015-08-22 DIAGNOSIS — H01004 Unspecified blepharitis left upper eyelid: Secondary | ICD-10-CM | POA: Diagnosis not present

## 2015-08-22 DIAGNOSIS — Z79899 Other long term (current) drug therapy: Secondary | ICD-10-CM | POA: Diagnosis not present

## 2015-08-22 DIAGNOSIS — H52223 Regular astigmatism, bilateral: Secondary | ICD-10-CM | POA: Diagnosis not present

## 2015-08-22 DIAGNOSIS — H01002 Unspecified blepharitis right lower eyelid: Secondary | ICD-10-CM | POA: Diagnosis not present

## 2015-08-22 DIAGNOSIS — H01001 Unspecified blepharitis right upper eyelid: Secondary | ICD-10-CM | POA: Diagnosis not present

## 2015-08-22 DIAGNOSIS — M069 Rheumatoid arthritis, unspecified: Secondary | ICD-10-CM | POA: Diagnosis not present

## 2015-08-22 DIAGNOSIS — H5203 Hypermetropia, bilateral: Secondary | ICD-10-CM | POA: Diagnosis not present

## 2015-08-29 DIAGNOSIS — S46212A Strain of muscle, fascia and tendon of other parts of biceps, left arm, initial encounter: Secondary | ICD-10-CM | POA: Diagnosis not present

## 2015-09-12 DIAGNOSIS — S46212D Strain of muscle, fascia and tendon of other parts of biceps, left arm, subsequent encounter: Secondary | ICD-10-CM | POA: Diagnosis not present

## 2015-09-19 DIAGNOSIS — S46212D Strain of muscle, fascia and tendon of other parts of biceps, left arm, subsequent encounter: Secondary | ICD-10-CM | POA: Diagnosis not present

## 2015-09-20 DIAGNOSIS — H16142 Punctate keratitis, left eye: Secondary | ICD-10-CM | POA: Diagnosis not present

## 2015-09-20 DIAGNOSIS — H1859 Other hereditary corneal dystrophies: Secondary | ICD-10-CM | POA: Diagnosis not present

## 2015-09-26 DIAGNOSIS — S46212A Strain of muscle, fascia and tendon of other parts of biceps, left arm, initial encounter: Secondary | ICD-10-CM | POA: Diagnosis not present

## 2015-10-10 DIAGNOSIS — L821 Other seborrheic keratosis: Secondary | ICD-10-CM | POA: Diagnosis not present

## 2015-10-10 DIAGNOSIS — D239 Other benign neoplasm of skin, unspecified: Secondary | ICD-10-CM | POA: Diagnosis not present

## 2015-11-09 DIAGNOSIS — M25511 Pain in right shoulder: Secondary | ICD-10-CM | POA: Diagnosis not present

## 2015-11-12 DIAGNOSIS — M0609 Rheumatoid arthritis without rheumatoid factor, multiple sites: Secondary | ICD-10-CM | POA: Diagnosis not present

## 2015-11-12 DIAGNOSIS — Z79899 Other long term (current) drug therapy: Secondary | ICD-10-CM | POA: Diagnosis not present

## 2015-11-29 ENCOUNTER — Other Ambulatory Visit: Payer: Self-pay | Admitting: Orthopaedic Surgery

## 2015-11-29 DIAGNOSIS — M25511 Pain in right shoulder: Secondary | ICD-10-CM

## 2015-12-04 DIAGNOSIS — M25511 Pain in right shoulder: Secondary | ICD-10-CM | POA: Diagnosis not present

## 2015-12-05 DIAGNOSIS — H00021 Hordeolum internum right upper eyelid: Secondary | ICD-10-CM | POA: Diagnosis not present

## 2015-12-05 DIAGNOSIS — H1859 Other hereditary corneal dystrophies: Secondary | ICD-10-CM | POA: Diagnosis not present

## 2015-12-10 ENCOUNTER — Ambulatory Visit
Admission: RE | Admit: 2015-12-10 | Discharge: 2015-12-10 | Disposition: A | Discharge status: Home / Self Care | Payer: PPO | Source: Ambulatory Visit | Attending: Orthopaedic Surgery | Admitting: Orthopaedic Surgery

## 2015-12-10 DIAGNOSIS — M25511 Pain in right shoulder: Secondary | ICD-10-CM

## 2015-12-12 DIAGNOSIS — M069 Rheumatoid arthritis, unspecified: Secondary | ICD-10-CM | POA: Diagnosis not present

## 2015-12-12 DIAGNOSIS — F325 Major depressive disorder, single episode, in full remission: Secondary | ICD-10-CM | POA: Diagnosis not present

## 2015-12-12 DIAGNOSIS — E7800 Pure hypercholesterolemia, unspecified: Secondary | ICD-10-CM | POA: Diagnosis not present

## 2015-12-12 DIAGNOSIS — I10 Essential (primary) hypertension: Secondary | ICD-10-CM | POA: Diagnosis not present

## 2015-12-12 DIAGNOSIS — E559 Vitamin D deficiency, unspecified: Secondary | ICD-10-CM | POA: Diagnosis not present

## 2015-12-12 DIAGNOSIS — Z0000 Encounter for general adult medical examination without abnormal findings: Secondary | ICD-10-CM | POA: Diagnosis not present

## 2015-12-18 DIAGNOSIS — M25511 Pain in right shoulder: Secondary | ICD-10-CM | POA: Diagnosis not present

## 2016-01-16 DIAGNOSIS — Z01419 Encounter for gynecological examination (general) (routine) without abnormal findings: Secondary | ICD-10-CM | POA: Diagnosis not present

## 2016-01-16 DIAGNOSIS — Z1231 Encounter for screening mammogram for malignant neoplasm of breast: Secondary | ICD-10-CM | POA: Diagnosis not present

## 2016-01-16 DIAGNOSIS — Z6821 Body mass index (BMI) 21.0-21.9, adult: Secondary | ICD-10-CM | POA: Diagnosis not present

## 2016-01-21 DIAGNOSIS — M75121 Complete rotator cuff tear or rupture of right shoulder, not specified as traumatic: Secondary | ICD-10-CM | POA: Diagnosis not present

## 2016-01-21 DIAGNOSIS — M24111 Other articular cartilage disorders, right shoulder: Secondary | ICD-10-CM | POA: Diagnosis not present

## 2016-01-21 DIAGNOSIS — M7581 Other shoulder lesions, right shoulder: Secondary | ICD-10-CM | POA: Diagnosis not present

## 2016-01-21 DIAGNOSIS — G8918 Other acute postprocedural pain: Secondary | ICD-10-CM | POA: Diagnosis not present

## 2016-02-07 ENCOUNTER — Ambulatory Visit: Payer: PPO | Attending: Orthopaedic Surgery | Admitting: Physical Therapy

## 2016-02-07 DIAGNOSIS — M25511 Pain in right shoulder: Secondary | ICD-10-CM | POA: Diagnosis not present

## 2016-02-07 DIAGNOSIS — M6281 Muscle weakness (generalized): Secondary | ICD-10-CM | POA: Diagnosis not present

## 2016-02-07 DIAGNOSIS — M25611 Stiffness of right shoulder, not elsewhere classified: Secondary | ICD-10-CM

## 2016-02-07 NOTE — Therapy (Signed)
[facility] [address] [place], [place], [address] Phone: [phone]   Fax:  [phone]  Physical Therapy Evaluation  Patient Details  Name: Gloria [name] MRN: [id number] Date of Birth: 01-25-1946 Referring Provider: Dr. Lorin Mercy  Encounter Date: 02/07/2016      PT End of Session - 02/07/16 1736    Visit Number 1   Number of Visits 10   Date for PT Re-Evaluation 05/01/16   Authorization Type Medicare G codes:  KX modifers at visit 15  (patient did have OT earlier in 2017)   PT Start Time 1235   PT Stop Time 1318   PT Time Calculation (min) 43 min   Activity Tolerance Patient tolerated treatment well      Past Medical History:  Diagnosis Date  . Anxiety   . Arthritis   . CKD (chronic kidney disease), stage III   . Complication of anesthesia 05/2014   had body shaking-observed over night-see anesthesia note-has had tremors post op in past  . Depression   . Hyperlipemia   . Hypertension   . Insomnia     Past Surgical History:  Procedure Laterality Date  . ABDOMINAL HYSTERECTOMY     age 84  . BACK SURGERY    . CARPAL TUNNEL RELEASE  01/20/2012   Procedure: CARPAL TUNNEL RELEASE;  Surgeon: Wynonia Sours, MD;  Location: [place];  Service: Orthopedics;  Laterality: Right;  . CARPAL TUNNEL RELEASE  02/25/2012   Procedure: CARPAL TUNNEL RELEASE;  Surgeon: Wynonia Sours, MD;  Location: [place];  Service: Orthopedics;  Laterality: Left;  RELEASE A-1 PULLEY LRF  . DISTAL BICEPS TENDON REPAIR Left 06/12/2015   Procedure: REPAIR LEFT BICEPS TENDON AND DECOMPRESSION OF MEDIAN NERVE;  Surgeon: Daryll Brod, MD;  Location: [place];  Service: Orthopedics;  Laterality: Left;  . SMALL INTESTINE SURGERY    . SMALL INTESTINE SURGERY     has had 6 total bowel obstructions -mostlt adhesions-last 2009  . TONSILLECTOMY     age 14  . TRIGGER FINGER RELEASE  2/12   rt middle  .  TRIGGER FINGER RELEASE  2010   lt middle  . ULNAR NERVE TRANSPOSITION Right 05/11/2014   Procedure: DECOMPRESSION ULNAR NERVE RIGHT ELBOW ;  Surgeon: Daryll Brod, MD;  Location: [place];  Service: Orthopedics;  Laterality: Right;  . ULNAR NERVE TRANSPOSITION Left 07/06/2014   Procedure: DECOMPRESSION  LEFT ULNAR NERVE;  Surgeon: Daryll Brod, MD;  Location: [place];  Service: Orthopedics;  Laterality: Left;    There were no vitals filed for this visit.       Subjective Assessment - 02/07/16 1236    Subjective 3-4 years ago right shoulder pain, saw Dr. Veverly Fells.  Surgery for massive tear followed for 4 months.  Husband died in 2022/11/23.  He had Parkinson's and she had to lift him.  MRI showed thinned tendons.  Had surgery 01/21/16 by Dr. Lorin Mercy partial thickness repair.  Presents in sling today with instructions to wear full time.  Sleeping OK.   Pertinent History left biceps tendon repair Feb 2017   Limitations House hold activities   Diagnostic tests MRI   Patient Stated Goals be able to reach, work in flower garden, reach into cupboard without using a chair   Currently in Pain? Yes   Pain Score 7    Pain Location Shoulder   Pain Orientation Right   Pain Type  Surgical pain   Pain Onset 1 to 4 weeks ago   Pain Frequency Intermittent   Aggravating Factors  any movement with right UE   Pain Relieving Factors not moving            [facility] PT Assessment - 02/07/16 0001      Assessment   Medical Diagnosis recurrent rotator cuff repair   Referring Provider Dr. Lorin Mercy   Onset Date/Surgical Date 01/21/16   Hand Dominance Right   Next MD Visit 2 weeks   Prior Therapy OT for left arm in February/March 8-10 x     Precautions   Precautions Shoulder   Type of Shoulder Precautions no heavy lifting      Restrictions   Weight Bearing Restrictions No     Balance Screen   Has the patient fallen in the past 6 months No   Has the patient had a decrease in activity  level because of a fear of falling?  No   Is the patient reluctant to leave their home because of a fear of falling?  No     Home Environment   Living Environment Private residence   Living Arrangements Alone   Available Help at Discharge Family     Prior Function   Level of Independence Independent with basic ADLs   Vocation Retired   Leisure read, ride a bike, walk, play with cats     Observation/Other Assessments   Focus on Therapeutic Outcomes (FOTO)  51% limitation     Posture/Postural Control   Posture Comments steri strips over superior incision;  small scope incisions appear well healed;  no redness or increased skin temp     ROM / Strength   AROM / PROM / Strength AROM;PROM;Strength     AROM   Overall AROM Comments right shoulder AROM not formally assess secondary to rotator cuff precautions   AROM Assessment Site Shoulder;Elbow   Right/Left Shoulder Right;Left   Left Shoulder Flexion 150 Degrees   Left Shoulder ABduction 145 Degrees   Left Shoulder Internal Rotation --  T6   Left Shoulder External Rotation 80 Degrees     PROM   PROM Assessment Site Shoulder   Right/Left Shoulder Right;Left   Right Shoulder Flexion 60 Degrees   Right Shoulder ABduction 20 Degrees   Right Shoulder Internal Rotation 30 Degrees   Right Shoulder External Rotation 0 Degrees     Strength   Overall Strength Comments Not formally assessed secondary to rotator cuff precautions.                            PT Education - 02/07/16 1736    Education provided Yes   Education Details scapular retractions; upper trap stretch   Person(s) Educated Patient   Methods Explanation;Demonstration;Handout   Comprehension Verbalized understanding;Returned demonstration          PT Short Term Goals - 02/07/16 1902      PT SHORT TERM GOAL #1   Title The patient will demonstrate basic home exercises for initial shoulder mobility and muscle recruitment to promote surgical  repair/healing  03/20/16   Time 6   Period Weeks   Status New     PT SHORT TERM GOAL #2   Title The patient will report a 30% reduction in right shoulder pain with basic ADLs like grooming and dressing   Time 6   Period Weeks   Status New     PT SHORT TERM  GOAL #3   Title The patient will be able to use right UE for wiping the counters and reach to shoulder level surfaces 90 degrees   Time 6   Period Weeks   Status New           PT Long Term Goals - 02/07/16 1905      PT LONG TERM GOAL #1   Title The patient will be independent in safe self progression of HEP for further improvements in ROM and strength  05/01/16   Time 12   Period Weeks   Status New     PT LONG TERM GOAL #2   Title The patient will have right shoulder flexion/scaption to 120 degrees needed for reaching into cupboards in the kitchen   Time 12   Period Weeks   Status New     PT LONG TERM GOAL #3   Title The patient will have 3+/5 glenohumeral and scapular muscle strength for lifting light objects with right UE   Time 12   Period Weeks   Status New     PT LONG TERM GOAL #4   Title The patient will report a 60% improvement in right shoulder pain with basic ADLs, home chores   Time 12   Period Weeks   Status New     PT LONG TERM GOAL #5   Title FOTO functional outcome score improved from 51% limitation to 39% indicating improved function with less pain   Time 12   Period Weeks   Status New               Plan - 02/07/16 1838    Clinical Impression Statement The patient is a 70 year old female who presents s/p right recurrent rotator cuff repair on 01/21/16.  She reports her  1st repair was 3 years ago for a massive tear.  Following that she was lifting her husband who had Parkinson's and she feels this is what re-injured her right shoulder. Her husband passed away in 2022-11-27.  She presents in a sling (no abduction pillow) and states she was told to wear this 24 hours a day.  She states she has  been doing pendulum swings as instructed by her doctor.  She states that since she lives alone, she must do her own basic household chores.  Formal AROM and MMT of right shoulder not performed secondary to recent rotator cuff surgical precautions.  Passive ROM:  flex 60, abd 20, IR 30, ER 0., all pain limited especially elevation.  Full elbow ROM;  Upper trap tightness and compensation noted with elevation.  Steri-strips intact over surgical incision, no redness or increased skin temp.   Anticipate a slower progression of rehab secondary to poor tissue integrity/re-repair and age.   The patient requests PT at only 1x/week secondary to financial concerns.  The patient is of moderate complexity evaluation secondary to co-morbidities including  multiple UE previous surgeries in the last 3 years, depression (her husband recently died) and lack of psychosocial and home support.     Clinical Impairments Affecting Rehab Potential right rotator cuff tear/repair 3 years ago;  Left biceps tendon repair;  ulnar nerve surgery; bilateral CTS; right rotator cuff repair 01/21/16   PT Frequency 1x / week  per patient request   PT Duration 12 weeks   PT Treatment/Interventions ADLs/Self Care Home Management;Cryotherapy;Electrical Stimulation;Ultrasound;Moist Heat;Therapeutic exercise;Manual techniques;Taping;Patient/family education;Passive range of motion   PT Next Visit Plan PROM right shoulder;  seated small range ball rolls;  scapular retractions; submax deltoid isometrics;  possible NMES to deltoids to decrease atrophy during healing phase or IFC for pain control;  slow progression of rehab secondary to 2nd repair and poor tissue integrity   Recommended Other Services called [facility] to request operative report      Patient will benefit from skilled therapeutic intervention in order to improve the following deficits and impairments:  Decreased range of motion, Decreased strength, Increased muscle spasms, Pain,  Impaired UE functional use  Visit Diagnosis: Acute pain of right shoulder - Plan: PT plan of care cert/re-cert  Stiffness of right shoulder, not elsewhere classified - Plan: PT plan of care cert/re-cert  Muscle weakness (generalized) - Plan: PT plan of care cert/re-cert      G-Codes - [id number]    Functional Assessment Tool Used FOTO;  clinical judgement    Functional Limitation Carrying, moving and handling objects   Carrying, Moving and Handling Objects Current Status [id number]) At least 40 percent but less than 60 percent impaired, limited or restricted   Carrying, Moving and Handling Objects Goal Status [id number]) At least 20 percent but less than 40 percent impaired, limited or restricted       Problem List Patient Active Problem List   Diagnosis Date Noted  . Ulnar nerve compression 05/11/2014   Ruben Im, PT 02/07/16 7:12 PM Phone: [phone] Fax: [phone] Alvera Singh 02/07/2016, 7:11 PM  [facility] [address] [place], [place], [address] Phone: [phone]   Fax:  [phone]  Name: Gloria [name] MRN: [id number] Date of Birth: [birth year]

## 2016-02-07 NOTE — Patient Instructions (Signed)
   http://gt2.exer.us/30   Copyright  VHI. All rights reserved.  Side-Bending   One hand on opposite side of head, pull head to side as far as is comfortable. Stop if there is pain. Hold _30___ seconds. Repeat with other hand to other side. Repeat __3__ times. Do _2-3___ sessions per day.   Copyright  VHI. All rights reserved.  Scapular Retraction (Standing)   With arms at sides, pinch shoulder blades together. Repeat __10__ times per set. Do __1__ sets per session. Do ___2-3_ sessions per day.  http://orth.exer.us/944   Copyright  VHI. All rights reserved.  [facility] [address] [place], [place] [address] Phone # [phone] Fax [phone]   Ruben Im, PT 02/07/16 1:11 PM Phone: [phone] Fax: [phone]

## 2016-02-14 ENCOUNTER — Ambulatory Visit: Payer: PPO | Admitting: Physical Therapy

## 2016-02-14 ENCOUNTER — Encounter: Payer: Self-pay | Admitting: Physical Therapy

## 2016-02-14 DIAGNOSIS — M25511 Pain in right shoulder: Secondary | ICD-10-CM | POA: Diagnosis not present

## 2016-02-14 DIAGNOSIS — M6281 Muscle weakness (generalized): Secondary | ICD-10-CM

## 2016-02-14 DIAGNOSIS — M25611 Stiffness of right shoulder, not elsewhere classified: Secondary | ICD-10-CM

## 2016-02-14 NOTE — Therapy (Addendum)
[facility] [address], [place], [place], [address] Phone: [phone]   Fax:  [phone]  Physical Therapy Treatment  Patient Details  Name: Gloria [name] MRN: [id number] Date of Birth: Sep 17, 1945 Referring Provider: Dr. Lorin Mercy  Encounter Date: 02/14/2016      PT End of Session - 02/14/16 1209    Visit Number 2   Number of Visits 10   Date for PT Re-Evaluation 05/01/16   Authorization Type Medicare G codes:  KX modifers at visit 15  (patient did have OT earlier in 2017)   PT Start Time 1142   PT Stop Time 1246   PT Time Calculation (min) 64 min   Activity Tolerance Patient tolerated treatment well      Past Medical History:  Diagnosis Date  . Anxiety   . Arthritis   . CKD (chronic kidney disease), stage III   . Complication of anesthesia 05/2014   had body shaking-observed over night-see anesthesia note-has had tremors post op in past  . Depression   . Hyperlipemia   . Hypertension   . Insomnia     Past Surgical History:  Procedure Laterality Date  . ABDOMINAL HYSTERECTOMY     age 55  . BACK SURGERY    . CARPAL TUNNEL RELEASE  01/20/2012   Procedure: CARPAL TUNNEL RELEASE;  Surgeon: Wynonia Sours, MD;  Location: [place];  Service: Orthopedics;  Laterality: Right;  . CARPAL TUNNEL RELEASE  02/25/2012   Procedure: CARPAL TUNNEL RELEASE;  Surgeon: Wynonia Sours, MD;  Location: [place];  Service: Orthopedics;  Laterality: Left;  RELEASE A-1 PULLEY LRF  . DISTAL BICEPS TENDON REPAIR Left 06/12/2015   Procedure: REPAIR LEFT BICEPS TENDON AND DECOMPRESSION OF MEDIAN NERVE;  Surgeon: Daryll Brod, MD;  Location: [place];  Service: Orthopedics;  Laterality: Left;  . SMALL INTESTINE SURGERY    . SMALL INTESTINE SURGERY     has had 6 total bowel obstructions -mostlt adhesions-last 2009  . TONSILLECTOMY     age 44  . TRIGGER FINGER RELEASE  2/12   rt middle  .  TRIGGER FINGER RELEASE  2010   lt middle  . ULNAR NERVE TRANSPOSITION Right 05/11/2014   Procedure: DECOMPRESSION ULNAR NERVE RIGHT ELBOW ;  Surgeon: Daryll Brod, MD;  Location: [place];  Service: Orthopedics;  Laterality: Right;  . ULNAR NERVE TRANSPOSITION Left 07/06/2014   Procedure: DECOMPRESSION  LEFT ULNAR NERVE;  Surgeon: Daryll Brod, MD;  Location: [place];  Service: Orthopedics;  Laterality: Left;    There were no vitals filed for this visit.      Subjective Assessment - 02/14/16 1144    Subjective Pt reports no pain in shoulder at rest. Pain increases to 6/10 with movement.    Pertinent History left biceps tendon repair Feb 2017   Limitations House hold activities   Diagnostic tests MRI   Patient Stated Goals be able to reach, work in flower garden, reach into cupboard without using a chair   Currently in Pain? No/denies                         [facility] Adult PT Treatment/Exercise - 02/14/16 0001      Exercises   Exercises Shoulder     Shoulder Exercises: Seated   Retraction AROM;Both;10 reps   External Rotation AAROM;Right;10 reps  Table slide   Flexion AAROM;Right;10 reps  Table slide   Abduction AAROM;Right;20 reps  Table slides     Shoulder Exercises: Pulleys   Flexion 2 minutes   ABduction 2 minutes     Manual Therapy   Manual Therapy Passive ROM   Manual therapy comments Rt shoulder   Passive ROM in all planes Pt supine     Electric stimulation to Rt shoulder for 15 minutes IFC for pain management. Ice pack to Rt shoulder for 15 minutes for pain management.  Addended 02/21/16 Mikle Bosworth, PTA 02/21/16 5:05 PM             PT Short Term Goals - 02/07/16 1902      PT SHORT TERM GOAL #1   Title The patient will demonstrate basic home exercises for initial shoulder mobility and muscle recruitment to promote surgical repair/healing  03/20/16   Time 6   Period Weeks   Status New     PT SHORT  TERM GOAL #2   Title The patient will report a 30% reduction in right shoulder pain with basic ADLs like grooming and dressing   Time 6   Period Weeks   Status New     PT SHORT TERM GOAL #3   Title The patient will be able to use right UE for wiping the counters and reach to shoulder level surfaces 90 degrees   Time 6   Period Weeks   Status New           PT Long Term Goals - 02/07/16 1905      PT LONG TERM GOAL #1   Title The patient will be independent in safe self progression of HEP for further improvements in ROM and strength  05/01/16   Time 12   Period Weeks   Status New     PT LONG TERM GOAL #2   Title The patient will have right shoulder flexion/scaption to 120 degrees needed for reaching into cupboards in the kitchen   Time 12   Period Weeks   Status New     PT LONG TERM GOAL #3   Title The patient will have 3+/5 glenohumeral and scapular muscle strength for lifting light objects with right UE   Time 12   Period Weeks   Status New     PT LONG TERM GOAL #4   Title The patient will report a 60% improvement in right shoulder pain with basic ADLs, home chores   Time 12   Period Weeks   Status New     PT LONG TERM GOAL #5   Title FOTO functional outcome score improved from 51% limitation to 39% indicating improved function with less pain   Time 12   Period Weeks   Status New               Plan - 02/14/16 1237    Clinical Impression Statement Pt presents with Rt shoulder sling and steri strips in tact on Rt shoulder. Pt reports no pain at rest but pain with movement. Able to tolerate table slides and pulleys well with ROM limited by pain and tightness. Pt guarded with manual P/ROM due to pain and tightness in Rt shoulder. Some increase in pain after manual stretching which was eased with modalities. Pt will continue to benfit from skilled therapy for shoulder ROM and strength.    Clinical Impairments Affecting Rehab Potential right rotator cuff  tear/repair 3 years ago;  Left biceps tendon repair;  ulnar nerve surgery; bilateral CTS; right rotator cuff repair  01/21/16   PT Frequency 1x / week   PT Duration 12 weeks   PT Treatment/Interventions ADLs/Self Care Home Management;Cryotherapy;Electrical Stimulation;Ultrasound;Moist Heat;Therapeutic exercise;Manual techniques;Taping;Patient/family education;Passive range of motion   PT Next Visit Plan PROM Rt shoulder, AAROM, modalities    Consulted and Agree with Plan of Care Patient      Patient will benefit from skilled therapeutic intervention in order to improve the following deficits and impairments:  Decreased range of motion, Decreased strength, Increased muscle spasms, Pain, Impaired UE functional use  Visit Diagnosis: Acute pain of right shoulder  Stiffness of right shoulder, not elsewhere classified  Muscle weakness (generalized)     Problem List Patient Active Problem List   Diagnosis Date Noted  . Ulnar nerve compression 05/11/2014    Mikle Bosworth PTA 02/14/2016, 12:52 PM  [facility] [address], [place], [place], [address] Phone: [phone]   Fax:  [phone]  Name: Gloria [name] MRN: [id number] Date of Birth: 1945/07/16

## 2016-02-21 ENCOUNTER — Encounter: Payer: Self-pay | Admitting: Physical Therapy

## 2016-02-21 ENCOUNTER — Ambulatory Visit: Payer: PPO | Admitting: Physical Therapy

## 2016-02-21 DIAGNOSIS — H43823 Vitreomacular adhesion, bilateral: Secondary | ICD-10-CM | POA: Diagnosis not present

## 2016-02-21 DIAGNOSIS — M25611 Stiffness of right shoulder, not elsewhere classified: Secondary | ICD-10-CM

## 2016-02-21 DIAGNOSIS — M25511 Pain in right shoulder: Secondary | ICD-10-CM

## 2016-02-21 DIAGNOSIS — H35362 Drusen (degenerative) of macula, left eye: Secondary | ICD-10-CM | POA: Diagnosis not present

## 2016-02-21 DIAGNOSIS — H35361 Drusen (degenerative) of macula, right eye: Secondary | ICD-10-CM | POA: Diagnosis not present

## 2016-02-21 DIAGNOSIS — H52223 Regular astigmatism, bilateral: Secondary | ICD-10-CM | POA: Diagnosis not present

## 2016-02-21 DIAGNOSIS — H5203 Hypermetropia, bilateral: Secondary | ICD-10-CM | POA: Diagnosis not present

## 2016-02-21 DIAGNOSIS — M6281 Muscle weakness (generalized): Secondary | ICD-10-CM

## 2016-02-21 DIAGNOSIS — H524 Presbyopia: Secondary | ICD-10-CM | POA: Diagnosis not present

## 2016-02-21 NOTE — Therapy (Signed)
[facility] [address] [place], [place], [address] Phone: [phone]   Fax:  [phone]  Physical Therapy Treatment  Patient Details  Name: Gloria [name] MRN: [id number] Date of Birth: 07-17-1945 Referring Provider: Dr. Lorin Mercy  Encounter Date: 02/21/2016      PT End of Session - 02/21/16 1537    Visit Number 3   Number of Visits 10   Date for PT Re-Evaluation 05/01/16   Authorization Type Medicare G codes:  KX modifers at visit 15  (patient did have OT earlier in 2017)   PT Start Time 1532   PT Stop Time 1631   PT Time Calculation (min) 59 min   Activity Tolerance Patient tolerated treatment well   Behavior During Therapy [facility] for tasks assessed/performed      Past Medical History:  Diagnosis Date  . Anxiety   . Arthritis   . CKD (chronic kidney disease), stage III   . Complication of anesthesia 05/2014   had body shaking-observed over night-see anesthesia note-has had tremors post op in past  . Depression   . Hyperlipemia   . Hypertension   . Insomnia     Past Surgical History:  Procedure Laterality Date  . ABDOMINAL HYSTERECTOMY     age 52  . BACK SURGERY    . CARPAL TUNNEL RELEASE  01/20/2012   Procedure: CARPAL TUNNEL RELEASE;  Surgeon: Wynonia Sours, MD;  Location: [place];  Service: Orthopedics;  Laterality: Right;  . CARPAL TUNNEL RELEASE  02/25/2012   Procedure: CARPAL TUNNEL RELEASE;  Surgeon: Wynonia Sours, MD;  Location: [place];  Service: Orthopedics;  Laterality: Left;  RELEASE A-1 PULLEY LRF  . DISTAL BICEPS TENDON REPAIR Left 06/12/2015   Procedure: REPAIR LEFT BICEPS TENDON AND DECOMPRESSION OF MEDIAN NERVE;  Surgeon: Daryll Brod, MD;  Location: [place];  Service: Orthopedics;  Laterality: Left;  . SMALL INTESTINE SURGERY    . SMALL INTESTINE SURGERY     has had 6 total bowel obstructions -mostlt adhesions-last 2009  . TONSILLECTOMY      age 56  . TRIGGER FINGER RELEASE  2/12   rt middle  . TRIGGER FINGER RELEASE  2010   lt middle  . ULNAR NERVE TRANSPOSITION Right 05/11/2014   Procedure: DECOMPRESSION ULNAR NERVE RIGHT ELBOW ;  Surgeon: Daryll Brod, MD;  Location: [place];  Service: Orthopedics;  Laterality: Right;  . ULNAR NERVE TRANSPOSITION Left 07/06/2014   Procedure: DECOMPRESSION  LEFT ULNAR NERVE;  Surgeon: Daryll Brod, MD;  Location: [place];  Service: Orthopedics;  Laterality: Left;    There were no vitals filed for this visit.      Subjective Assessment - 02/21/16 1535    Subjective Pt has minimal pain at rest. Increased pain with stretching. Pt reports feeling more tight this week than last but able to get good ROM after some movement.    Pertinent History left biceps tendon repair Feb 2017   Limitations House hold activities   Diagnostic tests MRI   Patient Stated Goals be able to reach, work in flower garden, reach into cupboard without using a chair   Currently in Pain? Yes   Pain Score 4    Pain Location Shoulder   Pain Orientation Right   Pain Type Surgical pain   Pain Onset 1 to 4 weeks ago   Pain Frequency Intermittent  [facility] PT Assessment - 02/21/16 0001      PROM   Right Shoulder Flexion 148 Degrees   Right Shoulder ABduction 108 Degrees   Right Shoulder Internal Rotation 62 Degrees   Right Shoulder External Rotation 70 Degrees                     [facility] Adult PT Treatment/Exercise - 02/21/16 0001      Shoulder Exercises: Seated   Retraction AROM;Both;10 reps   External Rotation AAROM;Right;10 reps  Table slide   Flexion AAROM;Right;10 reps  Table slide   Abduction AAROM;Right;20 reps  Table slides     Shoulder Exercises: Pulleys   Flexion 2 minutes   ABduction 2 minutes     Modalities   Modalities Electrical Stimulation;Cryotherapy     Cryotherapy   Number Minutes Cryotherapy 15 Minutes   Cryotherapy Location  Shoulder   Type of Cryotherapy Ice pack     Electrical Stimulation   Electrical Stimulation Location Rt shoulder   Electrical Stimulation Action IFC   Electrical Stimulation Parameters To tolerance   Electrical Stimulation Goals Pain     Manual Therapy   Manual Therapy Passive ROM   Manual therapy comments Rt shoulder   Passive ROM in all planes Pt supine                  PT Short Term Goals - 02/21/16 1538      PT SHORT TERM GOAL #1   Title The patient will demonstrate basic home exercises for initial shoulder mobility and muscle recruitment to promote surgical repair/healing  03/20/16   Time 6   Period Weeks   Status On-going     PT SHORT TERM GOAL #2   Title The patient will report a 30% reduction in right shoulder pain with basic ADLs like grooming and dressing   Time 6   Period Weeks   Status On-going     PT SHORT TERM GOAL #3   Title The patient will be able to use right UE for wiping the counters and reach to shoulder level surfaces 90 degrees   Time 6   Period Weeks   Status On-going           PT Long Term Goals - 02/21/16 1539      PT LONG TERM GOAL #1   Title The patient will be independent in safe self progression of HEP for further improvements in ROM and strength  05/01/16   Time 12   Period Weeks   Status On-going     PT LONG TERM GOAL #2   Title The patient will have right shoulder flexion/scaption to 120 degrees needed for reaching into cupboards in the kitchen   Time 12   Period Weeks   Status On-going     PT LONG TERM GOAL #3   Title The patient will have 3+/5 glenohumeral and scapular muscle strength for lifting light objects with right UE   Time 12   Period Weeks   Status On-going     PT LONG TERM GOAL #4   Title The patient will report a 60% improvement in right shoulder pain with basic ADLs, home chores   Time 12   Period Weeks   Status On-going     PT LONG TERM GOAL #5   Title FOTO functional outcome score improved  from 51% limitation to 39% indicating improved function with less pain   Time 12   Period Weeks   Status  On-going               Plan - 02/21/16 1701    Clinical Impression Statement Pt continues to progress with P/ROM. Pt has been compliant with home stretching with table slides and will begin using over the door pulleys. Pt P/ROM measured. Pt will continue to benefit from skilled therapy for shoulder rehabilitation post rotator cuff repair.    Clinical Impairments Affecting Rehab Potential right rotator cuff tear/repair 3 years ago;  Left biceps tendon repair;  ulnar nerve surgery; bilateral CTS; right rotator cuff repair 01/21/16   PT Frequency 1x / week   PT Duration 12 weeks   PT Treatment/Interventions ADLs/Self Care Home Management;Cryotherapy;Electrical Stimulation;Ultrasound;Moist Heat;Therapeutic exercise;Manual techniques;Taping;Patient/family education;Passive range of motion   PT Next Visit Plan PROM Rt shoulder, AAROM, modalities   Consulted and Agree with Plan of Care Patient      Patient will benefit from skilled therapeutic intervention in order to improve the following deficits and impairments:  Decreased range of motion, Decreased strength, Increased muscle spasms, Pain, Impaired UE functional use  Visit Diagnosis: Acute pain of right shoulder  Stiffness of right shoulder, not elsewhere classified  Muscle weakness (generalized)     Problem List Patient Active Problem List   Diagnosis Date Noted  . Ulnar nerve compression 05/11/2014    Mikle Bosworth PTA 02/21/2016, 5:03 PM  [facility] [address], [place], [place], [address] Phone: [phone]   Fax:  [phone]  Name: Gloria [name] MRN: [id number] Date of Birth: November 11, 1945

## 2016-02-25 ENCOUNTER — Encounter: Payer: PPO | Admitting: Physical Therapy

## 2016-02-28 ENCOUNTER — Encounter: Payer: PPO | Admitting: Physical Therapy

## 2016-02-29 ENCOUNTER — Ambulatory Visit: Payer: PPO | Admitting: Physical Therapy

## 2016-02-29 ENCOUNTER — Encounter: Payer: Self-pay | Admitting: Physical Therapy

## 2016-02-29 DIAGNOSIS — M25511 Pain in right shoulder: Secondary | ICD-10-CM

## 2016-02-29 DIAGNOSIS — M25611 Stiffness of right shoulder, not elsewhere classified: Secondary | ICD-10-CM

## 2016-02-29 DIAGNOSIS — M6281 Muscle weakness (generalized): Secondary | ICD-10-CM

## 2016-02-29 NOTE — Therapy (Signed)
[facility] [address], [place], [place], [address] Phone: [phone]   Fax:  [phone]  Physical Therapy Treatment  Patient Details  Name: Gloria [name] MRN: [id number] Date of Birth: Sep 21, 1945 Referring Provider: Dr. Lorin Mercy  Encounter Date: 02/29/2016      PT End of Session - 02/29/16 0845    Visit Number 4   Number of Visits 10   Date for PT Re-Evaluation 05/01/16   Authorization Type Medicare G codes:  KX modifers at visit 15  (patient did have OT earlier in 2017)   PT Start Time 0843   PT Stop Time 0940   PT Time Calculation (min) 57 min   Activity Tolerance Patient tolerated treatment well   Behavior During Therapy [facility] for tasks assessed/performed      Past Medical History:  Diagnosis Date  . Anxiety   . Arthritis   . CKD (chronic kidney disease), stage III   . Complication of anesthesia 05/2014   had body shaking-observed over night-see anesthesia note-has had tremors post op in past  . Depression   . Hyperlipemia   . Hypertension   . Insomnia     Past Surgical History:  Procedure Laterality Date  . ABDOMINAL HYSTERECTOMY     age 45  . BACK SURGERY    . CARPAL TUNNEL RELEASE  01/20/2012   Procedure: CARPAL TUNNEL RELEASE;  Surgeon: Wynonia Sours, MD;  Location: [place];  Service: Orthopedics;  Laterality: Right;  . CARPAL TUNNEL RELEASE  02/25/2012   Procedure: CARPAL TUNNEL RELEASE;  Surgeon: Wynonia Sours, MD;  Location: [place];  Service: Orthopedics;  Laterality: Left;  RELEASE A-1 PULLEY LRF  . DISTAL BICEPS TENDON REPAIR Left 06/12/2015   Procedure: REPAIR LEFT BICEPS TENDON AND DECOMPRESSION OF MEDIAN NERVE;  Surgeon: Daryll Brod, MD;  Location: [place];  Service: Orthopedics;  Laterality: Left;  . SMALL INTESTINE SURGERY    . SMALL INTESTINE SURGERY     has had 6 total bowel obstructions -mostlt adhesions-last 2009  . TONSILLECTOMY      age 74  . TRIGGER FINGER RELEASE  2/12   rt middle  . TRIGGER FINGER RELEASE  2010   lt middle  . ULNAR NERVE TRANSPOSITION Right 05/11/2014   Procedure: DECOMPRESSION ULNAR NERVE RIGHT ELBOW ;  Surgeon: Daryll Brod, MD;  Location: [place];  Service: Orthopedics;  Laterality: Right;  . ULNAR NERVE TRANSPOSITION Left 07/06/2014   Procedure: DECOMPRESSION  LEFT ULNAR NERVE;  Surgeon: Daryll Brod, MD;  Location: [place];  Service: Orthopedics;  Laterality: Left;    There were no vitals filed for this visit.      Subjective Assessment - 02/29/16 0845    Subjective Pt is spending less time in the sling but cannot go al day without wearing it due to pain.    Pertinent History left biceps tendon repair Feb 2017   Limitations House hold activities   Diagnostic tests MRI   Patient Stated Goals be able to reach, work in flower garden, reach into cupboard without using a chair   Currently in Pain? Yes   Pain Score 4    Pain Location Shoulder   Pain Orientation Right   Pain Type Surgical pain   Pain Onset 1 to 4 weeks ago            [facility] PT Assessment - 02/29/16 0001      PROM  Right Shoulder Flexion 148 Degrees   Right Shoulder ABduction 115 Degrees   Right Shoulder Internal Rotation 63 Degrees   Right Shoulder External Rotation 72 Degrees                     [facility] Adult PT Treatment/Exercise - 02/29/16 0001      Shoulder Exercises: Seated   Retraction AROM;Both;10 reps     Shoulder Exercises: Standing   Other Standing Exercises 6 way shoulder Isometrics  with towel at wall     Shoulder Exercises: Pulleys   Flexion 2 minutes   ABduction 2 minutes     Modalities   Modalities Electrical Stimulation;Cryotherapy     Cryotherapy   Number Minutes Cryotherapy 15 Minutes   Cryotherapy Location Shoulder   Type of Cryotherapy Ice pack     Electrical Stimulation   Electrical Stimulation Location Rt shoulder   Electrical  Stimulation Action IFC   Electrical Stimulation Parameters To tolerance   Electrical Stimulation Goals Pain     Manual Therapy   Manual Therapy Passive ROM   Manual therapy comments Rt shoulder   Passive ROM In all planes                  PT Short Term Goals - 02/21/16 1538      PT SHORT TERM GOAL #1   Title The patient will demonstrate basic home exercises for initial shoulder mobility and muscle recruitment to promote surgical repair/healing  03/20/16   Time 6   Period Weeks   Status On-going     PT SHORT TERM GOAL #2   Title The patient will report a 30% reduction in right shoulder pain with basic ADLs like grooming and dressing   Time 6   Period Weeks   Status On-going     PT SHORT TERM GOAL #3   Title The patient will be able to use right UE for wiping the counters and reach to shoulder level surfaces 90 degrees   Time 6   Period Weeks   Status On-going           PT Long Term Goals - 02/21/16 1539      PT LONG TERM GOAL #1   Title The patient will be independent in safe self progression of HEP for further improvements in ROM and strength  05/01/16   Time 12   Period Weeks   Status On-going     PT LONG TERM GOAL #2   Title The patient will have right shoulder flexion/scaption to 120 degrees needed for reaching into cupboards in the kitchen   Time 12   Period Weeks   Status On-going     PT LONG TERM GOAL #3   Title The patient will have 3+/5 glenohumeral and scapular muscle strength for lifting light objects with right UE   Time 12   Period Weeks   Status On-going     PT LONG TERM GOAL #4   Title The patient will report a 60% improvement in right shoulder pain with basic ADLs, home chores   Time 12   Period Weeks   Status On-going     PT LONG TERM GOAL #5   Title FOTO functional outcome score improved from 51% limitation to 39% indicating improved function with less pain   Time 12   Period Weeks   Status On-going                Plan - 02/29/16 [id number]  Clinical Impression Statement Pt begin light isometrics strenghtening today and tolerate well. Therapist giving throrough instructions for exercises and monitoring for pain thorughout treatment. Pt ROM continues to improve. Pt will continue to benefit from skilled therapy for shoulder ROM and strenght.    Clinical Impairments Affecting Rehab Potential right rotator cuff tear/repair 3 years ago;  Left biceps tendon repair;  ulnar nerve surgery; bilateral CTS; right rotator cuff repair 01/21/16   PT Frequency 1x / week   PT Duration 12 weeks   PT Treatment/Interventions ADLs/Self Care Home Management;Cryotherapy;Electrical Stimulation;Ultrasound;Moist Heat;Therapeutic exercise;Manual techniques;Taping;Patient/family education;Passive range of motion   PT Next Visit Plan PROM Rt shoulder, AAROM, modalities   Consulted and Agree with Plan of Care Patient      Patient will benefit from skilled therapeutic intervention in order to improve the following deficits and impairments:  Decreased range of motion, Decreased strength, Increased muscle spasms, Pain, Impaired UE functional use  Visit Diagnosis: Acute pain of right shoulder  Stiffness of right shoulder, not elsewhere classified  Muscle weakness (generalized)     Problem List Patient Active Problem List   Diagnosis Date Noted  . Ulnar nerve compression 05/11/2014    Mikle Bosworth PTA 02/29/2016, 9:43 AM  [facility] [address], [place], [place], [address] Phone: [phone]   Fax:  [phone]  Name: Gloria [name] MRN: [id number] Date of Birth: Sep 09, 1945

## 2016-03-04 ENCOUNTER — Ambulatory Visit (INDEPENDENT_AMBULATORY_CARE_PROVIDER_SITE_OTHER): Payer: PPO | Admitting: Orthopaedic Surgery

## 2016-03-04 ENCOUNTER — Encounter (INDEPENDENT_AMBULATORY_CARE_PROVIDER_SITE_OTHER): Payer: Self-pay | Admitting: Orthopaedic Surgery

## 2016-03-04 VITALS — BP 129/79 | HR 72 | Ht 66.5 in | Wt 131.0 lb

## 2016-03-04 DIAGNOSIS — Z9889 Other specified postprocedural states: Secondary | ICD-10-CM

## 2016-03-04 NOTE — Progress Notes (Signed)
Office Visit Note   Patient: Gloria [name]           Date of Birth: December 03, 1945           MRN: [id number] Visit Date: 03/04/2016              Requested by: [name], MD [place], [place] [address] PCP: Marjorie Smolder, MD   Assessment & Plan: Visit Diagnoses:  1. Status post right rotator cuff repair     Plan: Continue therapy return 1 month for recheck.  Follow-Up Instructions: Return in about 1 month (around 04/03/2016).   Orders:  No orders of the defined types were placed in this encounter.  No orders of the defined types were placed in this encounter.     Procedures: No procedures performed   Clinical Data: No additional findings.   Subjective: Chief Complaint  Patient presents with  . Right Shoulder - Follow-up    Patient returns status post Right Shoulder Arthroscopy with Rotator Cuff Repair on 01/21/16.  She is 6 weeks 1 day post op. She is attending physical therapy once a week and that is going well. She continues to have pain, worse with trying to raise arm.  She does not routinely take anything for pain, and will take Tylenol ES only if it really bothers her.    Review of Systems  Unchanged from before 01/21/16 shoulder surgery  Objective: Vital Signs: BP 129/79   Pulse 72   Ht 5' 6.5" (1.689 m)   Wt 131 lb (59.4 kg)   BMI 20.83 kg/m   Physical Exam  Constitutional: She is oriented to person, place, and time. She appears well-developed.  HENT:  Head: Normocephalic.  Right Ear: External ear normal.  Left Ear: External ear normal.  Eyes: Pupils are equal, round, and reactive to light.  Neck: No tracheal deviation present. No thyromegaly present.  Cardiovascular: Normal rate.   Pulmonary/Chest: Effort normal.  Abdominal: Soft.  Neurological: She is alert and oriented to person, place, and time.  Skin: Skin is warm and dry.  Psychiatric: She has a normal mood and affect. Her behavior is normal.    Ortho  Exam well-healed the right rotator cuff repair and arthroscopic portals. She is working with therapy once a weeks not taking any pain medication she says started being able to fix her hair. She'll continue with therapy return in one month. She is happy with surgical result.  Specialty Comments:  No specialty comments available.  Imaging: No results found.   PMFS History: Patient Active Problem List   Diagnosis Date Noted  . Status post right rotator cuff repair 03/04/2016  . Ulnar nerve compression 05/11/2014   Past Medical History:  Diagnosis Date  . Anxiety   . Arthritis   . CKD (chronic kidney disease), stage III   . Complication of anesthesia 05/2014   had body shaking-observed over night-see anesthesia note-has had tremors post op in past  . Depression   . Hyperlipemia   . Hypertension   . Insomnia     History reviewed. No pertinent family history.  Past Surgical History:  Procedure Laterality Date  . ABDOMINAL HYSTERECTOMY     age 59  . BACK SURGERY    . CARPAL TUNNEL RELEASE  01/20/2012   Procedure: CARPAL TUNNEL RELEASE;  Surgeon: Wynonia Sours, MD;  Location: [place];  Service: Orthopedics;  Laterality: Right;  . CARPAL TUNNEL RELEASE  02/25/2012   Procedure:  CARPAL TUNNEL RELEASE;  Surgeon: Wynonia Sours, MD;  Location: [place];  Service: Orthopedics;  Laterality: Left;  RELEASE A-1 PULLEY LRF  . DISTAL BICEPS TENDON REPAIR Left 06/12/2015   Procedure: REPAIR LEFT BICEPS TENDON AND DECOMPRESSION OF MEDIAN NERVE;  Surgeon: Daryll Brod, MD;  Location: [place];  Service: Orthopedics;  Laterality: Left;  . SMALL INTESTINE SURGERY    . SMALL INTESTINE SURGERY     has had 6 total bowel obstructions -mostlt adhesions-last 2009  . TONSILLECTOMY     age 28  . TRIGGER FINGER RELEASE  2/12   rt middle  . TRIGGER FINGER RELEASE  2010   lt middle  . ULNAR NERVE TRANSPOSITION Right 05/11/2014   Procedure: DECOMPRESSION ULNAR  NERVE RIGHT ELBOW ;  Surgeon: Daryll Brod, MD;  Location: [place];  Service: Orthopedics;  Laterality: Right;  . ULNAR NERVE TRANSPOSITION Left 07/06/2014   Procedure: DECOMPRESSION  LEFT ULNAR NERVE;  Surgeon: Daryll Brod, MD;  Location: [place];  Service: Orthopedics;  Laterality: Left;   Social History   Occupational History  . Not on file.   Social History Main Topics  . Smoking status: Never Smoker  . Smokeless tobacco: Never Used  . Alcohol use No  . Drug use: No  . Sexual activity: Not on file

## 2016-03-06 ENCOUNTER — Ambulatory Visit: Payer: PPO | Attending: Orthopaedic Surgery | Admitting: Physical Therapy

## 2016-03-06 DIAGNOSIS — M25511 Pain in right shoulder: Secondary | ICD-10-CM | POA: Diagnosis not present

## 2016-03-06 DIAGNOSIS — M6281 Muscle weakness (generalized): Secondary | ICD-10-CM

## 2016-03-06 DIAGNOSIS — M25611 Stiffness of right shoulder, not elsewhere classified: Secondary | ICD-10-CM

## 2016-03-06 NOTE — Therapy (Signed)
[facility] [address], [place], [place], [address] Phone: [phone]   Fax:  [phone]  Physical Therapy Treatment  Patient Details  Name: Gloria [name] MRN: [id number] Date of Birth: [birth year] Referring Provider: Dr. Lorin Mercy  Encounter Date: 03/06/2016      PT End of Session - 03/06/16 1303    Visit Number 5   Number of Visits 10   Date for PT Re-Evaluation 05/01/16   Authorization Type Medicare G codes:  KX modifers at visit 15  (patient did have OT earlier in 2017)   PT Start Time 1230   PT Stop Time 1308   PT Time Calculation (min) 38 min   Activity Tolerance Patient tolerated treatment well      Past Medical History:  Diagnosis Date  . Anxiety   . Arthritis   . CKD (chronic kidney disease), stage III   . Complication of anesthesia 05/2014   had body shaking-observed over night-see anesthesia note-has had tremors post op in past  . Depression   . Hyperlipemia   . Hypertension   . Insomnia     Past Surgical History:  Procedure Laterality Date  . ABDOMINAL HYSTERECTOMY     age 20  . BACK SURGERY    . CARPAL TUNNEL RELEASE  01/20/2012   Procedure: CARPAL TUNNEL RELEASE;  Surgeon: Wynonia Sours, MD;  Location: [place];  Service: Orthopedics;  Laterality: Right;  . CARPAL TUNNEL RELEASE  02/25/2012   Procedure: CARPAL TUNNEL RELEASE;  Surgeon: Wynonia Sours, MD;  Location: [place];  Service: Orthopedics;  Laterality: Left;  RELEASE A-1 PULLEY LRF  . DISTAL BICEPS TENDON REPAIR Left 06/12/2015   Procedure: REPAIR LEFT BICEPS TENDON AND DECOMPRESSION OF MEDIAN NERVE;  Surgeon: Daryll Brod, MD;  Location: [place];  Service: Orthopedics;  Laterality: Left;  . SMALL INTESTINE SURGERY    . SMALL INTESTINE SURGERY     has had 6 total bowel obstructions -mostlt adhesions-last 2009  . TONSILLECTOMY     age 64  . TRIGGER FINGER RELEASE  2/12   rt middle  .  TRIGGER FINGER RELEASE  2010   lt middle  . ULNAR NERVE TRANSPOSITION Right 05/11/2014   Procedure: DECOMPRESSION ULNAR NERVE RIGHT ELBOW ;  Surgeon: Daryll Brod, MD;  Location: [place];  Service: Orthopedics;  Laterality: Right;  . ULNAR NERVE TRANSPOSITION Left 07/06/2014   Procedure: DECOMPRESSION  LEFT ULNAR NERVE;  Surgeon: Daryll Brod, MD;  Location: [place];  Service: Orthopedics;  Laterality: Left;    There were no vitals filed for this visit.      Subjective Assessment - 03/06/16 1231    Subjective It's going OK just slow.  Saw Dr. Lorin Mercy and he said to keep going to PT.    Currently in Pain? Yes   Pain Score 3    Pain Location Shoulder   Pain Orientation Right   Pain Type Surgical pain                         [facility] Adult PT Treatment/Exercise - 03/06/16 0001      Shoulder Exercises: Supine   Protraction AAROM;Right;10 reps     Shoulder Exercises: Seated   Other Seated Exercises yellow band retractions 15x     Shoulder Exercises: Prone   Retraction AROM;Right;10 reps   Extension AROM;Strengthening;Right;10 reps     Shoulder Exercises: Sidelying  External Rotation AROM;Right;15 reps     Shoulder Exercises: Standing   Flexion AAROM;Right;10 reps   Flexion Limitations hand on green ball on mat table    Other Standing Exercises UE Ranger on floor, 1st and 2nd steps 15x each   Other Standing Exercises wall push ups 10x     Shoulder Exercises: Isometric Strengthening   Flexion 5X5"   Extension 5X5"   ABduction 5X5"     Manual Therapy   Manual Therapy Joint mobilization;Passive ROM;Muscle Energy Technique   Manual therapy comments Rt shoulder   Joint Mobilization [place] gentle distraction 5x;  AP gentle 5x   Passive ROM to patient tolerance 15x each plane   Muscle Energy Technique rythmic stab with bent elbow 90 degrees 30 sec                  PT Short Term Goals - 03/06/16 1319      PT SHORT TERM GOAL #1    Title The patient will demonstrate basic home exercises for initial shoulder mobility and muscle recruitment to promote surgical repair/healing  03/20/16   Time 6   Period Weeks   Status On-going     PT SHORT TERM GOAL #2   Title The patient will report a 30% reduction in right shoulder pain with basic ADLs like grooming and dressing   Time 6   Period Weeks   Status On-going     PT SHORT TERM GOAL #3   Title The patient will be able to use right UE for wiping the counters and reach to shoulder level surfaces 90 degrees   Time 6   Period Weeks   Status On-going           PT Long Term Goals - 03/06/16 1320      PT LONG TERM GOAL #1   Title The patient will be independent in safe self progression of HEP for further improvements in ROM and strength  05/01/16   Time 12   Period Weeks   Status On-going     PT LONG TERM GOAL #2   Title The patient will have right shoulder flexion/scaption to 120 degrees needed for reaching into cupboards in the kitchen   Time 12   Period Weeks   Status On-going     PT LONG TERM GOAL #3   Title The patient will have 3+/5 glenohumeral and scapular muscle strength for lifting light objects with right UE   Time 12   Period Weeks   Status On-going     PT LONG TERM GOAL #4   Title The patient will report a 60% improvement in right shoulder pain with basic ADLs, home chores   Time 12   Period Weeks   Status On-going     PT LONG TERM GOAL #5   Title FOTO functional outcome score improved from 51% limitation to 39% indicating improved function with less pain   Time 12   Period Weeks   Status On-going               Plan - 03/06/16 1307    Clinical Impression Statement The patient is progressing on schedule for re-repair of right rotator cuff.    Progression will be slower secondary to her poor soft tissue integrity.  Some increased discomfort with PROM to 120 degrees but otherwise pain is minimal .  Therapist closely verbally cuing  to avoid compensatory shoulder hike.  Also monitoring for pain and modifying as needed.  Patient declines  the need for modalities today.     PT Next Visit Plan PROM Rt shoulder, AAROMl,  modalities if needed;  Continue 1x/week per patient request (for financial reasons)      Patient will benefit from skilled therapeutic intervention in order to improve the following deficits and impairments:     Visit Diagnosis: Acute pain of right shoulder  Stiffness of right shoulder, not elsewhere classified  Muscle weakness (generalized)     Problem List Patient Active Problem List   Diagnosis Date Noted  . Status post right rotator cuff repair 03/04/2016  . Ulnar nerve compression 05/11/2014   Ruben Im, PT 03/06/16 1:21 PM Phone: [phone] Fax: [phone]  Alvera Singh 03/06/2016, 1:20 PM  [facility] [address], [place], [place], [address] Phone: [phone]   Fax:  [phone]  Name: Gloria [name] MRN: [id number] Date of Birth: October 18, 1945

## 2016-03-13 ENCOUNTER — Ambulatory Visit: Payer: PPO | Admitting: Physical Therapy

## 2016-03-13 ENCOUNTER — Encounter: Payer: Self-pay | Admitting: Physical Therapy

## 2016-03-13 DIAGNOSIS — M25511 Pain in right shoulder: Secondary | ICD-10-CM

## 2016-03-13 DIAGNOSIS — M25611 Stiffness of right shoulder, not elsewhere classified: Secondary | ICD-10-CM

## 2016-03-13 DIAGNOSIS — M6281 Muscle weakness (generalized): Secondary | ICD-10-CM

## 2016-03-13 NOTE — Patient Instructions (Signed)
(  Clinic) Extension / Flexion (Assist)    Face pulley, right arm as far forward and up as is pain free. Pull arm down toward side. Repeat __20__ times per set. Do ____ sets per session. Do ____ sessions per week. Use ____ lb weights.   Copyright  VHI. All rights reserved.  (Clinic) Retraction: Row - Bilateral (Pulley)    Facing pulley, arms reaching forward, pull hands toward stomach, pinching shoulder blades together. Repeat _20___ times per set. Do ____ sets per session. Do ____ sessions per week. Use ____ lb weights.  Copyright  VHI. All rights reserved.  External Rotation (Isometric)    Place back of left fist against door frame, with elbow bent. Press fist against door frame. Hold __5__ seconds. Repeat __10__ times. Do __2__ sessions per day.  http://gt2.exer.us/110   Copyright  VHI. All rights reserved.  Extension (Isometric)    Place left bent elbow and back of arm against wall. Press elbow against wall. Hold _5___ seconds. Repeat ___10_ times. Do __2__ sessions per day.  http://gt2.exer.us/112   Copyright  VHI. All rights reserved.  [place] [address] [place], [place] [address] Phone # [phone] Fax [phone] Zannie Cove, PT 03/13/16 12:41 PM

## 2016-03-13 NOTE — Therapy (Signed)
[facility] [address], [place], [place], [address] Phone: [phone]   Fax:  [phone]  Physical Therapy Treatment  Patient Details  Name: Gloria [name] MRN: [id number] Date of Birth: 01-Dec-1945 Referring Provider: Dr. Lorin Mercy  Encounter Date: 03/13/2016      PT End of Session - 03/13/16 1148    Visit Number 6   Number of Visits 10   Date for PT Re-Evaluation 05/01/16   Authorization Type Medicare G codes:  KX modifers at visit 15  (patient did have OT earlier in 2017)   PT Start Time 1148   PT Stop Time 1241   PT Time Calculation (min) 53 min   Activity Tolerance Patient tolerated treatment well   Behavior During Therapy [facility] for tasks assessed/performed      Past Medical History:  Diagnosis Date  . Anxiety   . Arthritis   . CKD (chronic kidney disease), stage III   . Complication of anesthesia 05/2014   had body shaking-observed over night-see anesthesia note-has had tremors post op in past  . Depression   . Hyperlipemia   . Hypertension   . Insomnia     Past Surgical History:  Procedure Laterality Date  . ABDOMINAL HYSTERECTOMY     age 12  . BACK SURGERY    . CARPAL TUNNEL RELEASE  01/20/2012   Procedure: CARPAL TUNNEL RELEASE;  Surgeon: Wynonia Sours, MD;  Location: [place];  Service: Orthopedics;  Laterality: Right;  . CARPAL TUNNEL RELEASE  02/25/2012   Procedure: CARPAL TUNNEL RELEASE;  Surgeon: Wynonia Sours, MD;  Location: [place];  Service: Orthopedics;  Laterality: Left;  RELEASE A-1 PULLEY LRF  . DISTAL BICEPS TENDON REPAIR Left 06/12/2015   Procedure: REPAIR LEFT BICEPS TENDON AND DECOMPRESSION OF MEDIAN NERVE;  Surgeon: Daryll Brod, MD;  Location: [place];  Service: Orthopedics;  Laterality: Left;  . SMALL INTESTINE SURGERY    . SMALL INTESTINE SURGERY     has had 6 total bowel obstructions -mostlt adhesions-last 2009  . TONSILLECTOMY      age 47  . TRIGGER FINGER RELEASE  2/12   rt middle  . TRIGGER FINGER RELEASE  2010   lt middle  . ULNAR NERVE TRANSPOSITION Right 05/11/2014   Procedure: DECOMPRESSION ULNAR NERVE RIGHT ELBOW ;  Surgeon: Daryll Brod, MD;  Location: [place];  Service: Orthopedics;  Laterality: Right;  . ULNAR NERVE TRANSPOSITION Left 07/06/2014   Procedure: DECOMPRESSION  LEFT ULNAR NERVE;  Surgeon: Daryll Brod, MD;  Location: [place];  Service: Orthopedics;  Laterality: Left;    There were no vitals filed for this visit.      Subjective Assessment - 03/13/16 1149    Currently in Pain? Yes   Pain Score 3    Pain Location Shoulder   Pain Orientation Right                         [facility] Adult PT Treatment/Exercise - 03/13/16 0001      Shoulder Exercises: Prone   Retraction AROM;Right;15 reps  yellow band   Extension AROM;Strengthening;Right;15 reps  yellow band     Shoulder Exercises: Standing   Other Standing Exercises AAROM finger ladder sholder flex, towel slides shoulder abduction - 10x each RUE   Other Standing Exercises wall push ups 10x     Shoulder Exercises: Isometric Strengthening   Flexion 5X5"  Extension 5X5"   External Rotation 5X5"   Internal Rotation 5X5"   ABduction 5X5"                PT Education - 03/13/16 1253    Education provided Yes   Education Details added to HEP   Person(s) Educated Patient   Methods Explanation;Tactile cues;Verbal cues;Handout   Comprehension Verbalized understanding          PT Short Term Goals - 03/13/16 1301      PT SHORT TERM GOAL #1   Title The patient will demonstrate basic home exercises for initial shoulder mobility and muscle recruitment to promote surgical repair/healing  03/20/16   Baseline initiated strengthening in HEP today   Time 6   Period Weeks   Status On-going     PT SHORT TERM GOAL #2   Title The patient will report a 30% reduction in right shoulder  pain with basic ADLs like grooming and dressing   Time 6   Period Weeks   Status On-going     PT SHORT TERM GOAL #3   Title The patient will be able to use right UE for wiping the counters and reach to shoulder level surfaces 90 degrees   Time 6   Period Weeks   Status On-going           PT Long Term Goals - 03/06/16 1320      PT LONG TERM GOAL #1   Title The patient will be independent in safe self progression of HEP for further improvements in ROM and strength  05/01/16   Time 12   Period Weeks   Status On-going     PT LONG TERM GOAL #2   Title The patient will have right shoulder flexion/scaption to 120 degrees needed for reaching into cupboards in the kitchen   Time 12   Period Weeks   Status On-going     PT LONG TERM GOAL #3   Title The patient will have 3+/5 glenohumeral and scapular muscle strength for lifting light objects with right UE   Time 12   Period Weeks   Status On-going     PT LONG TERM GOAL #4   Title The patient will report a 60% improvement in right shoulder pain with basic ADLs, home chores   Time 12   Period Weeks   Status On-going     PT LONG TERM GOAL #5   Title FOTO functional outcome score improved from 51% limitation to 39% indicating improved function with less pain   Time 12   Period Weeks   Status On-going               Plan - 03/13/16 1258    Clinical Impression Statement Pt was able to progress with increased reps and continued with strengthening exercises.  Pt responded well to manual with increased flexion and abduction >120 deg PROM after joint mobilizations.  Pt cont to need verbal cues for decreased guarding during AAROM and during exercises.  Pt reports not needing modalities and would like to focus on strengthening since she is feeling better.   Clinical Impairments Affecting Rehab Potential right rotator cuff tear/repair 3 years ago;  Left biceps tendon repair;  ulnar nerve surgery; bilateral CTS; right rotator cuff  repair 01/21/16   PT Frequency 1x / week   PT Duration 12 weeks   PT Treatment/Interventions ADLs/Self Care Home Management;Cryotherapy;Electrical Stimulation;Ultrasound;Moist Heat;Therapeutic exercise;Manual techniques;Taping;Patient/family education;Passive range of motion   PT Next Visit Plan  PROM Rt shoulder, AAROMl,  progress shoulder strengthening as tolerated, modalities if needed   Consulted and Agree with Plan of Care Patient      Patient will benefit from skilled therapeutic intervention in order to improve the following deficits and impairments:  Decreased range of motion, Decreased strength, Increased muscle spasms, Pain, Impaired UE functional use  Visit Diagnosis: Acute pain of right shoulder  Stiffness of right shoulder, not elsewhere classified  Muscle weakness (generalized)     Problem List Patient Active Problem List   Diagnosis Date Noted  . Status post right rotator cuff repair 03/04/2016  . Ulnar nerve compression 05/11/2014    Zannie Cove, PT 03/13/2016, 1:07 PM  [facility] [address] [place], [place], [address] Phone: [phone]   Fax:  [phone]  Name: Gloria [name] MRN: [id number] Date of Birth: April 16, 1946

## 2016-03-20 ENCOUNTER — Encounter: Payer: Self-pay | Admitting: Physical Therapy

## 2016-03-20 ENCOUNTER — Ambulatory Visit: Payer: PPO | Admitting: Physical Therapy

## 2016-03-20 DIAGNOSIS — M25611 Stiffness of right shoulder, not elsewhere classified: Secondary | ICD-10-CM

## 2016-03-20 DIAGNOSIS — M25511 Pain in right shoulder: Secondary | ICD-10-CM

## 2016-03-20 DIAGNOSIS — M6281 Muscle weakness (generalized): Secondary | ICD-10-CM

## 2016-03-20 NOTE — Therapy (Signed)
[facility] [address], [place], [place], [address] Phone: [phone]   Fax:  [phone]  Physical Therapy Treatment  Patient Details  Name: Gloria [name] MRN: [id number] Date of Birth: January 01, 1946 Referring Provider: Dr. Lorin Mercy  Encounter Date: 03/20/2016      PT End of Session - 03/20/16 1145    Visit Number 7   Number of Visits 10   Date for PT Re-Evaluation 05/01/16   Authorization Type Medicare G codes:  KX modifers at visit 15  (patient did have OT earlier in 2017)   PT Start Time 1146   PT Stop Time 1229   PT Time Calculation (min) 43 min   Activity Tolerance Patient tolerated treatment well   Behavior During Therapy [facility] for tasks assessed/performed      Past Medical History:  Diagnosis Date  . Anxiety   . Arthritis   . CKD (chronic kidney disease), stage III   . Complication of anesthesia 05/2014   had body shaking-observed over night-see anesthesia note-has had tremors post op in past  . Depression   . Hyperlipemia   . Hypertension   . Insomnia     Past Surgical History:  Procedure Laterality Date  . ABDOMINAL HYSTERECTOMY     age 70  . BACK SURGERY    . CARPAL TUNNEL RELEASE  01/20/2012   Procedure: CARPAL TUNNEL RELEASE;  Surgeon: Wynonia Sours, MD;  Location: [place];  Service: Orthopedics;  Laterality: Right;  . CARPAL TUNNEL RELEASE  02/25/2012   Procedure: CARPAL TUNNEL RELEASE;  Surgeon: Wynonia Sours, MD;  Location: [place];  Service: Orthopedics;  Laterality: Left;  RELEASE A-1 PULLEY LRF  . DISTAL BICEPS TENDON REPAIR Left 06/12/2015   Procedure: REPAIR LEFT BICEPS TENDON AND DECOMPRESSION OF MEDIAN NERVE;  Surgeon: Daryll Brod, MD;  Location: [place];  Service: Orthopedics;  Laterality: Left;  . SMALL INTESTINE SURGERY    . SMALL INTESTINE SURGERY     has had 6 total bowel obstructions -mostlt adhesions-last 2009  . TONSILLECTOMY      age 70  . TRIGGER FINGER RELEASE  2/12   rt middle  . TRIGGER FINGER RELEASE  2010   lt middle  . ULNAR NERVE TRANSPOSITION Right 05/11/2014   Procedure: DECOMPRESSION ULNAR NERVE RIGHT ELBOW ;  Surgeon: Daryll Brod, MD;  Location: [place];  Service: Orthopedics;  Laterality: Right;  . ULNAR NERVE TRANSPOSITION Left 07/06/2014   Procedure: DECOMPRESSION  LEFT ULNAR NERVE;  Surgeon: Daryll Brod, MD;  Location: [place];  Service: Orthopedics;  Laterality: Left;    There were no vitals filed for this visit.      Subjective Assessment - 03/20/16 1146    Subjective Pt reports she has been doing the exercises and it is going well.   Pertinent History left biceps tendon repair Feb 2017   Limitations House hold activities   Patient Stated Goals be able to reach, work in flower garden, reach into cupboard without using a chair   Currently in Pain? Yes   Pain Score 3    Pain Location Shoulder   Pain Orientation Right   Pain Type Surgical pain   Pain Onset More than a month ago   Pain Frequency Intermittent   Aggravating Factors  any movements with right UE   Pain Relieving Factors not moving   Multiple Pain Sites No  [place] Adult PT Treatment/Exercise - 03/20/16 0001      Shoulder Exercises: Supine   Protraction --     Shoulder Exercises: Prone   Retraction --    External Rotation --   Theraband Level (Shoulder External Rotation) --     Shoulder Exercises: Sidelying   External Rotation --     Shoulder Exercises: Standing   External Rotation AROM;Strengthening;Both;20 reps;Theraband  foam roller for tactile cues to improve posture   Theraband Level (Shoulder External Rotation) Level 1 (Yellow)   Flexion AROM;Both;10 reps   ABduction Right;AROM;10 reps  AROM scaption 10 reps   Retraction AROM;20 reps  foam roller for tactile feedback   Other Standing Exercises Rt shoulder isometric 5 ways: 10x 5sec hold    Other Standing Exercises wall push ups 20x     Shoulder Exercises: Pulleys   Flexion 2 minutes   ABduction 2 minutes     Manual Therapy   Manual Therapy Joint mobilization;Passive ROM   Manual therapy comments Rt shoulder   Joint Mobilization [place] gentle distraction 5x;  AP gentle 5x   Passive ROM to patient tolerance 15x each plane   Muscle Energy Technique --                  PT Short Term Goals - 03/13/16 1301      PT SHORT TERM GOAL #1   Title The patient will demonstrate basic home exercises for initial shoulder mobility and muscle recruitment to promote surgical repair/healing  03/20/16   Baseline initiated strengthening in HEP today   Time 6   Period Weeks   Status On-going     PT SHORT TERM GOAL #2   Title The patient will report a 30% reduction in right shoulder pain with basic ADLs like grooming and dressing   Time 6   Period Weeks   Status On-going     PT SHORT TERM GOAL #3   Title The patient will be able to use right UE for wiping the counters and reach to shoulder level surfaces 90 degrees   Time 6   Period Weeks   Status On-going           PT Long Term Goals - 03/06/16 1320      PT LONG TERM GOAL #1   Title The patient will be independent in safe self progression of HEP for further improvements in ROM and strength  05/01/16   Time 12   Period Weeks   Status On-going     PT LONG TERM GOAL #2   Title The patient will have right shoulder flexion/scaption to 120 degrees needed for reaching into cupboards in the kitchen   Time 12   Period Weeks   Status On-going     PT LONG TERM GOAL #3   Title The patient will have 3+/5 glenohumeral and scapular muscle strength for lifting light objects with right UE   Time 12   Period Weeks   Status On-going     PT LONG TERM GOAL #4   Title The patient will report a 60% improvement in right shoulder pain with basic ADLs, home chores   Time 12   Period Weeks   Status On-going     PT LONG TERM  GOAL #5   Title FOTO functional outcome score improved from 51% limitation to 39% indicating improved function with less pain   Time 12   Period Weeks   Status On-going  Plan - 03/20/16 1150    Clinical Impression Statement Pt was able to tolerated some increased reps and resistence during strengthening exercises.  Able to add AROM flexion, scaption, and abduction today.  Needs cues to keep shoulders down and back and prevent upper trap from over activating.  Continues to need skilled PT for strengthening and ROM exercises in order to safely return to overhead functional activities.     Clinical Impairments Affecting Rehab Potential right rotator cuff tear/repair 3 years ago;  Left biceps tendon repair;  ulnar nerve surgery; bilateral CTS; right rotator cuff repair 01/21/16   PT Frequency 1x / week   PT Duration 12 weeks   PT Treatment/Interventions ADLs/Self Care Home Management;Cryotherapy;Electrical Stimulation;Ultrasound;Moist Heat;Therapeutic exercise;Manual techniques;Taping;Patient/family education;Passive range of motion   PT Next Visit Plan ROM exericses, AROM in standing and prone as tolerated   PT Home Exercise Plan add AROM exercises to HEP   Consulted and Agree with Plan of Care Patient      Patient will benefit from skilled therapeutic intervention in order to improve the following deficits and impairments:  Decreased range of motion, Decreased strength, Increased muscle spasms, Pain, Impaired UE functional use  Visit Diagnosis: Acute pain of right shoulder  Stiffness of right shoulder, not elsewhere classified  Muscle weakness (generalized)     Problem List Patient Active Problem List   Diagnosis Date Noted  . Status post right rotator cuff repair 03/04/2016  . Ulnar nerve compression 05/11/2014    Zannie Cove, PT 03/20/2016, 12:49 PM  [facility] [address] [place], [place], [address] Phone: [phone]   Fax:  [phone]  Name: SHADORA HEYNEN MRN: [id number] Date of Birth: [birth year]

## 2016-03-25 ENCOUNTER — Ambulatory Visit: Payer: PPO | Admitting: Physical Therapy

## 2016-03-25 DIAGNOSIS — M6281 Muscle weakness (generalized): Secondary | ICD-10-CM

## 2016-03-25 DIAGNOSIS — M25511 Pain in right shoulder: Secondary | ICD-10-CM | POA: Diagnosis not present

## 2016-03-25 DIAGNOSIS — M25611 Stiffness of right shoulder, not elsewhere classified: Secondary | ICD-10-CM

## 2016-03-25 NOTE — Therapy (Signed)
[facility] [address] [place], [place], [address] Phone: [phone]   Fax:  [phone]  Physical Therapy Treatment  Patient Details  Name: Gloria [name] MRN: [id number] Date of Birth: 01-15-1946 Referring Provider: Dr. Lorin Mercy  Encounter Date: 03/25/2016      PT End of Session - 03/25/16 1401    Visit Number 8   Number of Visits 10   Date for PT Re-Evaluation 05/01/16   Authorization Type Medicare G codes:  KX modifers at visit 15  (patient did have OT earlier in 2017)   PT Start Time 1145   PT Stop Time 1230   PT Time Calculation (min) 45 min   Activity Tolerance Patient tolerated treatment well      Past Medical History:  Diagnosis Date  . Anxiety   . Arthritis   . CKD (chronic kidney disease), stage III   . Complication of anesthesia 05/2014   had body shaking-observed over night-see anesthesia note-has had tremors post op in past  . Depression   . Hyperlipemia   . Hypertension   . Insomnia     Past Surgical History:  Procedure Laterality Date  . ABDOMINAL HYSTERECTOMY     age 70  . BACK SURGERY    . CARPAL TUNNEL RELEASE  01/20/2012   Procedure: CARPAL TUNNEL RELEASE;  Surgeon: Wynonia Sours, MD;  Location: [place];  Service: Orthopedics;  Laterality: Right;  . CARPAL TUNNEL RELEASE  02/25/2012   Procedure: CARPAL TUNNEL RELEASE;  Surgeon: Wynonia Sours, MD;  Location: [place];  Service: Orthopedics;  Laterality: Left;  RELEASE A-1 PULLEY LRF  . DISTAL BICEPS TENDON REPAIR Left 06/12/2015   Procedure: REPAIR LEFT BICEPS TENDON AND DECOMPRESSION OF MEDIAN NERVE;  Surgeon: Daryll Brod, MD;  Location: [place];  Service: Orthopedics;  Laterality: Left;  . SMALL INTESTINE SURGERY    . SMALL INTESTINE SURGERY     has had 6 total bowel obstructions -mostlt adhesions-last 2009  . TONSILLECTOMY     age 63  . TRIGGER FINGER RELEASE  2/12   rt middle  .  TRIGGER FINGER RELEASE  2010   lt middle  . ULNAR NERVE TRANSPOSITION Right 05/11/2014   Procedure: DECOMPRESSION ULNAR NERVE RIGHT ELBOW ;  Surgeon: Daryll Brod, MD;  Location: [place];  Service: Orthopedics;  Laterality: Right;  . ULNAR NERVE TRANSPOSITION Left 07/06/2014   Procedure: DECOMPRESSION  LEFT ULNAR NERVE;  Surgeon: Daryll Brod, MD;  Location: [place];  Service: Orthopedics;  Laterality: Left;    There were no vitals filed for this visit.      Subjective Assessment - 03/25/16 1147    Subjective I did have some night pain after doing exercises right before going to bed.  Also did some gardening and had some upper arm pain the next day.     Currently in Pain? Yes   Pain Score 2    Pain Location Shoulder   Pain Orientation Right   Pain Type Surgical pain            [facility] PT Assessment - 03/25/16 0001      AROM   Right/Left Shoulder Right   Right Shoulder Flexion 120 Degrees   Right Shoulder ABduction 120 Degrees   Right Shoulder Internal Rotation --  behind back to L1   Right Shoulder External Rotation 65 Degrees  [facility] Adult PT Treatment/Exercise - 03/25/16 0001      Shoulder Exercises: Prone   Extension AROM;Right;15 reps   Horizontal ABduction 1 AROM;Right;15 reps  very small arc   Other Prone Exercises prone row 15x     Shoulder Exercises: Sidelying   External Rotation AROM;Right;15 reps     Shoulder Exercises: Standing   Protraction AAROM;12 reps  red physioball roll up wall 12x   Extension Strengthening;Right;10 reps   Theraband Level (Shoulder Extension) Level 1 (Yellow)   Row Strengthening;Right;10 reps  mostly scapular retraction   Theraband Level (Shoulder Row) Level 1 (Yellow)   Other Standing Exercises UE Ranger on floor, 1st and 2nd, 3rd steps 15x each   Other Standing Exercises wall push ups 20x     Manual Therapy   Joint Mobilization GH gentle distraction 5x;  AP gentle 5x    Passive ROM to patient tolerance 15x each plane   Muscle Energy Technique rythmic stab with bent elbow 90 degrees 30 sec                  PT Short Term Goals - 03/25/16 1550      PT SHORT TERM GOAL #1   Title The patient will demonstrate basic home exercises for initial shoulder mobility and muscle recruitment to promote surgical repair/healing  03/20/16   Status Achieved     PT SHORT TERM GOAL #2   Title The patient will report a 30% reduction in right shoulder pain with basic ADLs like grooming and dressing   Status Achieved     PT SHORT TERM GOAL #3   Title The patient will be able to use right UE for wiping the counters and reach to shoulder level surfaces 90 degrees   Status Achieved           PT Long Term Goals - 03/25/16 1550      PT LONG TERM GOAL #1   Title The patient will be independent in safe self progression of HEP for further improvements in ROM and strength  05/01/16   Time 12   Period Weeks   Status On-going     PT LONG TERM GOAL #2   Title The patient will have right shoulder flexion/scaption to 120 degrees needed for reaching into cupboards in the kitchen   Status Achieved     PT LONG TERM GOAL #3   Title The patient will have 3+/5 glenohumeral and scapular muscle strength for lifting light objects with right UE   Time 12   Period Weeks   Status On-going     PT LONG TERM GOAL #4   Title The patient will report a 60% improvement in right shoulder pain with basic ADLs, home chores   Status Achieved     PT LONG TERM GOAL #5   Title FOTO functional outcome score improved from 51% limitation to 39% indicating improved function with less pain   Time 12   Period Weeks   Status On-going               Plan - 03/25/16 1546    Clinical Impression Statement The patient is progressing very well with AAROM and AROM of shoulder.  Her motor control  gravity- assisted is improving and ROM against gravity has been initiated.  All STGs met.   Progression of therapy has been deliberately slower secondary to this surgery was a re-repair and poor tissue quality.  No joint stiffnes noted with passive motions.  The patient denies  pain with ex or manual techniques.  Therapist closely monitoring response with all.     PT Next Visit Plan AAROM and  beginning AROM in supine, prone, seated and standing;  G code in 2 more visits      Patient will benefit from skilled therapeutic intervention in order to improve the following deficits and impairments:     Visit Diagnosis: Acute pain of right shoulder  Stiffness of right shoulder, not elsewhere classified  Muscle weakness (generalized)     Problem List Patient Active Problem List   Diagnosis Date Noted  . Status post right rotator cuff repair 03/04/2016  . Ulnar nerve compression 05/11/2014   Ruben Im, PT 03/25/16 3:53 PM Phone: [phone] Fax: [phone]  Alvera Singh 03/25/2016, 3:53 PM  [facility] [address] [place], [place], [address] Phone: [phone]   Fax:  [phone]  Name: LINNETTE PANELLA MRN: [id number] Date of Birth: 1945-05-16

## 2016-04-02 ENCOUNTER — Encounter (INDEPENDENT_AMBULATORY_CARE_PROVIDER_SITE_OTHER): Payer: Self-pay | Admitting: Orthopaedic Surgery

## 2016-04-02 ENCOUNTER — Ambulatory Visit (INDEPENDENT_AMBULATORY_CARE_PROVIDER_SITE_OTHER): Payer: PPO | Admitting: Orthopaedic Surgery

## 2016-04-02 VITALS — BP 133/75 | HR 76 | Ht 66.5 in | Wt 131.0 lb

## 2016-04-02 DIAGNOSIS — Z9889 Other specified postprocedural states: Secondary | ICD-10-CM

## 2016-04-02 NOTE — Progress Notes (Signed)
Post-Op Visit Note   Patient: Gloria [name]           Date of Birth: Dec 13, 1945           MRN: [id number] Visit Date: 04/02/2016 PCP: Marjorie Smolder, MD   Assessment & Plan:  Chief Complaint:  Chief Complaint  Patient presents with  . Right Shoulder - Routine Post Op, Follow-up   Visit Diagnoses:  1. S/P right rotator cuff repair     Plan: Patient will continue formal PT for a couple weeks and then transition to a home exercise program.  Follow-Up Instructions: Return in about 6 weeks (around 05/14/2016).  If she is doing well at that time she may cancel the appointment.  Orders:  No orders of the defined types were placed in this encounter.  No orders of the defined types were placed in this encounter.  Patient returns for four week follow up right shoulder. She is status post right shoulder arthroscopy with rotator cuff repair on 01/21/2016. She is 10 weeks 2 days out. She has continued physical therapy. She states that she is improving.  PMFS History: Patient Active Problem List   Diagnosis Date Noted  . Status post right rotator cuff repair 03/04/2016  . Ulnar nerve compression 05/11/2014   Past Medical History:  Diagnosis Date  . Anxiety   . Arthritis   . CKD (chronic kidney disease), stage III   . Complication of anesthesia 05/2014   had body shaking-observed over night-see anesthesia note-has had tremors post op in past  . Depression   . Hyperlipemia   . Hypertension   . Insomnia     History reviewed. No pertinent family history.  Past Surgical History:  Procedure Laterality Date  . ABDOMINAL HYSTERECTOMY     age 61  . BACK SURGERY    . CARPAL TUNNEL RELEASE  01/20/2012   Procedure: CARPAL TUNNEL RELEASE;  Surgeon: Wynonia Sours, MD;  Location: [place];  Service: Orthopedics;  Laterality: Right;  . CARPAL TUNNEL RELEASE  02/25/2012   Procedure: CARPAL TUNNEL RELEASE;  Surgeon: Wynonia Sours, MD;  Location: [place];   Service: Orthopedics;  Laterality: Left;  RELEASE A-1 PULLEY LRF  . DISTAL BICEPS TENDON REPAIR Left 06/12/2015   Procedure: REPAIR LEFT BICEPS TENDON AND DECOMPRESSION OF MEDIAN NERVE;  Surgeon: Daryll Brod, MD;  Location: [place];  Service: Orthopedics;  Laterality: Left;  . SMALL INTESTINE SURGERY    . SMALL INTESTINE SURGERY     has had 6 total bowel obstructions -mostlt adhesions-last 2009  . TONSILLECTOMY     age 3  . TRIGGER FINGER RELEASE  2/12   rt middle  . TRIGGER FINGER RELEASE  2010   lt middle  . ULNAR NERVE TRANSPOSITION Right 05/11/2014   Procedure: DECOMPRESSION ULNAR NERVE RIGHT ELBOW ;  Surgeon: Daryll Brod, MD;  Location: [place];  Service: Orthopedics;  Laterality: Right;  . ULNAR NERVE TRANSPOSITION Left 07/06/2014   Procedure: DECOMPRESSION  LEFT ULNAR NERVE;  Surgeon: Daryll Brod, MD;  Location: [place];  Service: Orthopedics;  Laterality: Left;   Social History   Occupational History  . Not on file.   Social History Main Topics  . Smoking status: Never Smoker  . Smokeless tobacco: Never Used  . Alcohol use No  . Drug use: No  . Sexual activity: Not on file   Exam right shoulder flexion/abduction to about 170. Good internal rotation behind  her back. Very good cuff strength.

## 2016-04-02 NOTE — Patient Instructions (Signed)
Continue formal physical therapy 2 more weeks and then transition to home exercise program.

## 2016-04-03 ENCOUNTER — Encounter: Payer: PPO | Admitting: Physical Therapy

## 2016-04-10 ENCOUNTER — Ambulatory Visit: Payer: PPO | Attending: Orthopaedic Surgery | Admitting: Physical Therapy

## 2016-04-10 ENCOUNTER — Encounter: Payer: Self-pay | Admitting: Physical Therapy

## 2016-04-10 DIAGNOSIS — M25611 Stiffness of right shoulder, not elsewhere classified: Secondary | ICD-10-CM | POA: Insufficient documentation

## 2016-04-10 DIAGNOSIS — M6281 Muscle weakness (generalized): Secondary | ICD-10-CM | POA: Diagnosis not present

## 2016-04-10 DIAGNOSIS — M25511 Pain in right shoulder: Secondary | ICD-10-CM | POA: Insufficient documentation

## 2016-04-10 NOTE — Therapy (Signed)
[facility] [address] [place], [place], [address] Phone: [phone]   Fax:  [phone]  Physical Therapy Treatment/Discharge Summary  Patient Details  Name: Gloria [name] MRN: [id number] Date of Birth: 19-May-1945 Referring Provider: Dr. Lorin Mercy  Encounter Date: 04/10/2016      PT End of Session - 04/10/16 1212    Visit Number 9   Number of Visits 10   Date for PT Re-Evaluation 05/01/16   Authorization Type Medicare G codes:  KX modifers at visit 15  (patient did have OT earlier in 2017)   PT Start Time 1145   PT Stop Time 1220   PT Time Calculation (min) 35 min   Activity Tolerance Patient tolerated treatment well   Behavior During Therapy [facility] for tasks assessed/performed      Past Medical History:  Diagnosis Date  . Anxiety   . Arthritis   . CKD (chronic kidney disease), stage III   . Complication of anesthesia 05/2014   had body shaking-observed over night-see anesthesia note-has had tremors post op in past  . Depression   . Hyperlipemia   . Hypertension   . Insomnia     Past Surgical History:  Procedure Laterality Date  . ABDOMINAL HYSTERECTOMY     age 70  . BACK SURGERY    . CARPAL TUNNEL RELEASE  01/20/2012   Procedure: CARPAL TUNNEL RELEASE;  Surgeon: Wynonia Sours, MD;  Location: [place];  Service: Orthopedics;  Laterality: Right;  . CARPAL TUNNEL RELEASE  02/25/2012   Procedure: CARPAL TUNNEL RELEASE;  Surgeon: Wynonia Sours, MD;  Location: [place];  Service: Orthopedics;  Laterality: Left;  RELEASE A-1 PULLEY LRF  . DISTAL BICEPS TENDON REPAIR Left 06/12/2015   Procedure: REPAIR LEFT BICEPS TENDON AND DECOMPRESSION OF MEDIAN NERVE;  Surgeon: Daryll Brod, MD;  Location: [place];  Service: Orthopedics;  Laterality: Left;  . SMALL INTESTINE SURGERY    . SMALL INTESTINE SURGERY     has had 6 total bowel obstructions -mostlt adhesions-last 2009   . TONSILLECTOMY     age 70  . TRIGGER FINGER RELEASE  2/12   rt middle  . TRIGGER FINGER RELEASE  2010   lt middle  . ULNAR NERVE TRANSPOSITION Right 05/11/2014   Procedure: DECOMPRESSION ULNAR NERVE RIGHT ELBOW ;  Surgeon: Daryll Brod, MD;  Location: [place];  Service: Orthopedics;  Laterality: Right;  . ULNAR NERVE TRANSPOSITION Left 07/06/2014   Procedure: DECOMPRESSION  LEFT ULNAR NERVE;  Surgeon: Daryll Brod, MD;  Location: [place];  Service: Orthopedics;  Laterality: Left;    There were no vitals filed for this visit.      Subjective Assessment - 04/10/16 1202    Subjective Pt states she can do everything at home.  Reports when she saw the doctor he felt she did not not further PT and was satisfied with her progress. Pt is wondering about using the trimmer to trim the hedges at home   Pertinent History left biceps tendon repair Feb 2017   Patient Stated Goals Pt reports no further limitations or goals   Currently in Pain? No/denies  states a little tightness when lifting forward, but no pain   Multiple Pain Sites No                         [facility] Adult PT Treatment/Exercise - 04/10/16 0001  Shoulder Exercises: Standing   Flexion AROM;Both;10 reps;Weights   Shoulder Flexion Weight (lbs) 2   ABduction Right;AROM;10 reps;Weights  AROM scaption 10 reps   Shoulder ABduction Weight (lbs) 2   Extension Strengthening;Right;10 reps   Theraband Level (Shoulder Extension) Level 3 (Green)   Row Strengthening;Right;10 reps  mostly scapular retraction   Theraband Level (Shoulder Row) Level 3 (Green)   Other Standing Exercises wall push ups 20x     Shoulder Exercises: Pulleys   Flexion 3 minutes     Shoulder Exercises: ROM/Strengthening   UBE (Upper Arm Bike) 6 min forward L2, 2 min back L2                PT Education - 04/10/16 1211    Education provided Yes   Education Details educated on how to progress HEP and to  trip hedges for 5 minutes to see how shoulder responds   Person(s) Educated Patient   Methods Explanation   Comprehension Verbalized understanding          PT Short Term Goals - 03/25/16 1550      PT SHORT TERM GOAL #1   Title The patient will demonstrate basic home exercises for initial shoulder mobility and muscle recruitment to promote surgical repair/healing  03/20/16   Status Achieved     PT SHORT TERM GOAL #2   Title The patient will report a 30% reduction in right shoulder pain with basic ADLs like grooming and dressing   Status Achieved     PT SHORT TERM GOAL #3   Title The patient will be able to use right UE for wiping the counters and reach to shoulder level surfaces 90 degrees   Status Achieved           PT Long Term Goals - 04/10/16 1149      PT LONG TERM GOAL #1   Title The patient will be independent in safe self progression of HEP for further improvements in ROM and strength  05/01/16   Baseline pt is indpendent and able to progress stretches on her own currently   Time 12   Period Weeks   Status Achieved     PT LONG TERM GOAL #2   Title The patient will have right shoulder flexion/scaption to 120 degrees needed for reaching into cupboards in the kitchen   Baseline 145 abduction and 160 flexion   Time 12   Period Weeks   Status Achieved     PT LONG TERM GOAL #3   Title The patient will have 3+/5 glenohumeral and scapular muscle strength for lifting light objects with right UE   Baseline 4+/5 in all direction   Time 12   Period Weeks   Status Achieved     PT LONG TERM GOAL #4   Title The patient will report a 60% improvement in right shoulder pain with basic ADLs, home chores   Time 12   Period Weeks   Status Achieved     PT LONG TERM GOAL #5   Title FOTO functional outcome score improved from 51% limitation to 39% indicating improved function with less pain   Baseline 27%   Time 12   Period Weeks   Status Achieved                Plan - 04/10/16 1218    Clinical Impression Statement Pt has achieved all of her goals at this time and is able to continue her advanced HEP on her own.  She  has seen the doctor who agrees that she will not need further PT at this point.  Plan to d/c with HEP at this time.   Consulted and Agree with Plan of Care Patient      Patient will benefit from skilled therapeutic intervention in order to improve the following deficits and impairments:     Visit Diagnosis: Acute pain of right shoulder  Stiffness of right shoulder, not elsewhere classified  Muscle weakness (generalized)       G-Codes - 2016/04/19 1335    Functional Assessment Tool Used FOTO; clinical judgement  FOTO 27% limitation   Functional Limitation Carrying, moving and handling objects   Carrying, Moving and Handling Objects Current Status (P5465) At least 20 percent but less than 40 percent impaired, limited or restricted   Carrying, Moving and Handling Objects Goal Status (K8127) At least 20 percent but less than 40 percent impaired, limited or restricted   Carrying, Moving and Handling Objects Discharge Status [phone]) At least 20 percent but less than 40 percent impaired, limited or restricted      Problem List Patient Active Problem List   Diagnosis Date Noted  . Status post right rotator cuff repair 03/04/2016  . Ulnar nerve compression 05/11/2014    Zannie Cove, PT 04-19-2016, 1:37 PM  [facility] [address], [place], [place], [address] Phone: [phone]   Fax:  [phone]  Name: PANSEY PINHEIRO MRN: [id number] Date of Birth: Aug 05, 1945  PHYSICAL THERAPY DISCHARGE SUMMARY  Visits from Start of Care: 9  Current functional level related to goals / functional outcomes: CJ   Remaining deficits: none   Education / Equipment: HEP  Plan: Patient agrees to discharge.  Patient goals were met. Patient is being discharged due to meeting  the stated rehab goals.  ?????

## 2016-04-17 ENCOUNTER — Encounter: Payer: Self-pay | Admitting: Physical Therapy

## 2016-05-14 DIAGNOSIS — M0609 Rheumatoid arthritis without rheumatoid factor, multiple sites: Secondary | ICD-10-CM | POA: Diagnosis not present

## 2016-05-14 DIAGNOSIS — Z6820 Body mass index (BMI) 20.0-20.9, adult: Secondary | ICD-10-CM | POA: Diagnosis not present

## 2016-05-14 DIAGNOSIS — M654 Radial styloid tenosynovitis [de Quervain]: Secondary | ICD-10-CM | POA: Diagnosis not present

## 2016-05-14 DIAGNOSIS — M150 Primary generalized (osteo)arthritis: Secondary | ICD-10-CM | POA: Diagnosis not present

## 2016-05-14 DIAGNOSIS — Z79899 Other long term (current) drug therapy: Secondary | ICD-10-CM | POA: Diagnosis not present

## 2016-05-16 ENCOUNTER — Ambulatory Visit (INDEPENDENT_AMBULATORY_CARE_PROVIDER_SITE_OTHER): Payer: PPO | Admitting: Orthopaedic Surgery

## 2016-05-16 VITALS — BP 105/65 | HR 76 | Ht 66.5 in | Wt 131.0 lb

## 2016-05-16 DIAGNOSIS — M75121 Complete rotator cuff tear or rupture of right shoulder, not specified as traumatic: Secondary | ICD-10-CM

## 2016-05-16 NOTE — Progress Notes (Signed)
Office Visit Note   Patient: Gloria [name]           Date of Birth: 07-Oct-1945           MRN: [id number] Visit Date: 05/16/2016 Requested by: [name], MD [place], [place] [address] PCP: Marjorie Smolder, MD  Subjective: Chief Complaint  Patient presents with  . Right Shoulder - Follow-up    Patient is s/p right shoulder scope with debridement and RCR on 01/21/2016.  She is doing well.  No problems and is back to normal activities.  No meds.    Patient's had left distal biceps tendon repair. She's had rheumatoid arthritis for 3 years and also had the median nerve decompressed the left elbow. States this is doing well. Patient is able to fix her hair she has good range of motion she states she does a lot of overhead activity her shoulder gets tired. Does not bother her with bathing or dressing. She made good progress with physical therapy.               Review of Systems   Assessment & Plan: Visit Diagnoses:  1. Complete tear of right rotator cuff    Plan: We will release her from care she's happy with the surgical result from the 01/21/2016 right shoulder rotator cuff repair. She can return on a when necessary basis. She continues to have the follow-up for rheumatoid arthritis with appropriate treatment by her rheumatologist.  Follow-Up Instructions: No Follow-up on file.   Orders:  No orders of the defined types were placed in this encounter.  No orders of the defined types were placed in this encounter.     Procedures: No procedures performed   Clinical Data: No additional findings.  Objective: Vital Signs: BP 105/65   Pulse 76   Ht 5' 6.5" (1.689 m)   Wt 131 lb (59.4 kg)   BMI 20.83 kg/m   Physical Exam  Constitutional: She is oriented to person, place, and time. She appears well-developed.  HENT:  Head: Normocephalic.  Right Ear: External ear normal.  Left Ear: External ear normal.  Eyes: Pupils are equal, round, and  reactive to light.  Neck: No tracheal deviation present. No thyromegaly present.  Cardiovascular: Normal rate.   Pulmonary/Chest: Effort normal.  Abdominal: Soft.  Musculoskeletal:  Shoulder incisions well-healed as well as arthroscopic portals. She will get her arm up overhead easily. Negative drop arm test negative impingement. Well-healed left distal biceps repair by Dr. Fredna Dow normal heel toe gait. Good cervical range of motion. Pulses at the wrist are normal she has normal capillary refill with good grip strength.  Neurological: She is alert and oriented to person, place, and time.  Skin: Skin is warm and dry.  Psychiatric: She has a normal mood and affect. Her behavior is normal.    Ortho Exam  Specialty Comments:  No specialty comments available.  Imaging: No results found.   PMFS History: Patient Active Problem List   Diagnosis Date Noted  . Complete tear of right rotator cuff 05/16/2016  . Status post right rotator cuff repair 03/04/2016  . Ulnar nerve compression 05/11/2014   Past Medical History:  Diagnosis Date  . Anxiety   . Arthritis   . CKD (chronic kidney disease), stage III   . Complication of anesthesia 05/2014   had body shaking-observed over night-see anesthesia note-has had tremors post op in past  . Depression   . Hyperlipemia   . Hypertension   .  Insomnia     No family history on file.  Past Surgical History:  Procedure Laterality Date  . ABDOMINAL HYSTERECTOMY     age 27  . BACK SURGERY    . CARPAL TUNNEL RELEASE  01/20/2012   Procedure: CARPAL TUNNEL RELEASE;  Surgeon: Wynonia Sours, MD;  Location: [facility] [place] [place];  Service: Orthopedics;  Laterality: Right;  . CARPAL TUNNEL RELEASE  02/25/2012   Procedure: CARPAL TUNNEL RELEASE;  Surgeon: Wynonia Sours, MD;  Location: [place];  Service: Orthopedics;  Laterality: Left;  RELEASE A-1 PULLEY LRF  . DISTAL BICEPS TENDON REPAIR Left 06/12/2015   Procedure: REPAIR LEFT BICEPS  TENDON AND DECOMPRESSION OF MEDIAN NERVE;  Surgeon: Daryll Brod, MD;  Location: [place];  Service: Orthopedics;  Laterality: Left;  . SMALL INTESTINE SURGERY    . SMALL INTESTINE SURGERY     has had 6 total bowel obstructions -mostlt adhesions-last 2009  . TONSILLECTOMY     age 56  . TRIGGER FINGER RELEASE  2/12   rt middle  . TRIGGER FINGER RELEASE  2010   lt middle  . ULNAR NERVE TRANSPOSITION Right 05/11/2014   Procedure: DECOMPRESSION ULNAR NERVE RIGHT ELBOW ;  Surgeon: Daryll Brod, MD;  Location: [place];  Service: Orthopedics;  Laterality: Right;  . ULNAR NERVE TRANSPOSITION Left 07/06/2014   Procedure: DECOMPRESSION  LEFT ULNAR NERVE;  Surgeon: Daryll Brod, MD;  Location: [place];  Service: Orthopedics;  Laterality: Left;   Social History   Occupational History  . Not on file.   Social History Main Topics  . Smoking status: Never Smoker  . Smokeless tobacco: Never Used  . Alcohol use No  . Drug use: No  . Sexual activity: Not on file

## 2016-06-18 DIAGNOSIS — E7800 Pure hypercholesterolemia, unspecified: Secondary | ICD-10-CM | POA: Diagnosis not present

## 2016-06-18 DIAGNOSIS — I10 Essential (primary) hypertension: Secondary | ICD-10-CM | POA: Diagnosis not present

## 2016-07-11 DIAGNOSIS — Z86010 Personal history of colonic polyps: Secondary | ICD-10-CM | POA: Diagnosis not present

## 2016-08-13 DIAGNOSIS — L821 Other seborrheic keratosis: Secondary | ICD-10-CM | POA: Diagnosis not present

## 2016-08-13 DIAGNOSIS — L570 Actinic keratosis: Secondary | ICD-10-CM | POA: Diagnosis not present

## 2016-08-13 DIAGNOSIS — D229 Melanocytic nevi, unspecified: Secondary | ICD-10-CM | POA: Diagnosis not present

## 2016-08-15 ENCOUNTER — Encounter: Payer: Self-pay | Admitting: Podiatry

## 2016-08-15 ENCOUNTER — Ambulatory Visit (INDEPENDENT_AMBULATORY_CARE_PROVIDER_SITE_OTHER): Payer: PPO | Admitting: Podiatry

## 2016-08-15 ENCOUNTER — Ambulatory Visit (INDEPENDENT_AMBULATORY_CARE_PROVIDER_SITE_OTHER): Payer: PPO

## 2016-08-15 VITALS — BP 118/68 | HR 79 | Resp 16 | Ht 66.5 in | Wt 131.0 lb

## 2016-08-15 DIAGNOSIS — S92531A Displaced fracture of distal phalanx of right lesser toe(s), initial encounter for closed fracture: Secondary | ICD-10-CM | POA: Diagnosis not present

## 2016-08-15 DIAGNOSIS — M79674 Pain in right toe(s): Secondary | ICD-10-CM

## 2016-08-15 DIAGNOSIS — M2041 Other hammer toe(s) (acquired), right foot: Secondary | ICD-10-CM

## 2016-08-15 NOTE — Progress Notes (Signed)
   Subjective:    Patient ID: Gloria [name], female    DOB: 1945-08-04, 71 y.o.   MRN: [id number]  HPI  Chief Complaint  Patient presents with  . Foot Injury    Right foot; 5th toe & lateral side; pt stated, "Twisted foot between the door and vacuum cleaner on June 05, 2016; 5th toe is throbbing"  . Painful lesion    Right foot; 5th toe-lateral side      Review of Systems  Musculoskeletal: Positive for back pain and gait problem.  Neurological: Positive for headaches.  All other systems reviewed and are negative.      Objective:   Physical Exam        Assessment & Plan:

## 2016-08-15 NOTE — Patient Instructions (Signed)
Pre-Operative Instructions  Congratulations, you have decided to take an important step to improving your quality of life.  You can be assured that the doctors of [facility] will be with you every step of the way.  1. Plan to be at the surgery center/hospital at least 1 (one) hour prior to your scheduled time unless otherwise directed by the surgical center/hospital staff.  You must have a responsible adult accompany you, remain during the surgery and drive you home.  Make sure you have directions to the surgical center/hospital and know how to get there on time. 2. For hospital based surgery you will need to obtain a history and physical form from your family physician within 1 month prior to the date of surgery- we will give you a form for you primary physician.  3. We make every effort to accommodate the date you request for surgery.  There are however, times where surgery dates or times have to be moved.  We will contact you as soon as possible if a change in schedule is required.   4. No Aspirin/Ibuprofen for one week before surgery.  If you are on aspirin, any non-steroidal anti-inflammatory medications (Mobic, Aleve, Ibuprofen) you should stop taking it 7 days prior to your surgery.  You make take Tylenol  For pain prior to surgery.  5. Medications- If you are taking daily heart and blood pressure medications, seizure, reflux, allergy, asthma, anxiety, pain or diabetes medications, make sure the surgery center/hospital is aware before the day of surgery so they may notify you which medications to take or avoid the day of surgery. 6. No food or drink after midnight the night before surgery unless directed otherwise by surgical center/hospital staff. 7. No alcoholic beverages 24 hours prior to surgery.  No smoking 24 hours prior to or 24 hours after surgery. 8. Wear loose pants or shorts- loose enough to fit over bandages, boots, and casts. 9. No slip on shoes, sneakers are best. 10. Bring  your boot with you to the surgery center/hospital.  Also bring crutches or a walker if your physician has prescribed it for you.  If you do not have this equipment, it will be provided for you after surgery. 11. If you have not been contracted by the surgery center/hospital by the day before your surgery, call to confirm the date and time of your surgery. 12. Leave-time from work may vary depending on the type of surgery you have.  Appropriate arrangements should be made prior to surgery with your employer. 13. Prescriptions will be provided immediately following surgery by your doctor.  Have these filled as soon as possible after surgery and take the medication as directed. 14. Remove nail polish on the operative foot. 15. Wash the night before surgery.  The night before surgery wash the foot and leg well with the antibacterial soap provided and water paying special attention to beneath the toenails and in between the toes.  Rinse thoroughly with water and dry well with a towel.  Perform this wash unless told not to do so by your physician.  Enclosed: 1 Ice pack (please put in freezer the night before surgery)   1 Hibiclens skin cleaner   Pre-op Instructions  If you have any questions regarding the instructions, do not hesitate to call our office.  [place]: [address] [place], [place] [address] [phone]  [place]: [address] [place], [place] [address] [phone]  [place]: [address]  [place], [place] [address] [phone]   Dr.   Hephzibah Strehle DPM, Dr. Matthew Wagoner DPM, Dr. M. Todd Hyatt DPM, Dr. Titorya Stover DPM 

## 2016-08-17 NOTE — Progress Notes (Signed)
Subjective:     Patient ID: Gloria [name], female   DOB: [birth year], 71 y.o.   MRN: [id number]  HPI patient presents stating that she traumatized her foot approximately 10 weeks ago and that it has remained very swollen and painful and she cannot walk on her right lateral foot or wear shoe gear with any degree of comfort   Review of Systems  All other systems reviewed and are negative.      Objective:   Physical Exam  Constitutional: She is oriented to person, place, and time.  Cardiovascular: Intact distal pulses.   Musculoskeletal: Normal range of motion.  Neurological: She is oriented to person, place, and time.  Skin: Skin is warm.  Nursing note and vitals reviewed.  neurovascular status found to be intact muscle strength adequate range of motion within normal limits with patient found to have edema and pain of the base of the fifth digit right and pain on the outside of the fifth digit right with rotational component and swelling noted. It is very tender and it is increasingly hard for her to walk on     Assessment:     Traumatized right fifth toe with possibility for nonhealing fracture along with digital deformity fifth digit right    Plan:     H&P conditions and x-rays reviewed with patient. Due to the intense discomfort and the nonunion of bone I've recommended removal of a fracture base of fifth digit right along with distal arthroplasty with rotational component fifth digit. I allowed her to read a consent form today going over alternative treatments complications and patient wants surgery understanding risk. Patient signed consent form is given all preoperative instructions scheduled for outpatient surgery understanding that she's got a lose part of the joint on the fifth digit this could cause arthritis and she may need further surgery  X-ray indicated fracture base of fifth digit right medial side and severe rotation fifth digit right with enlargement of middle phalanx

## 2016-08-21 DIAGNOSIS — H04123 Dry eye syndrome of bilateral lacrimal glands: Secondary | ICD-10-CM | POA: Diagnosis not present

## 2016-08-21 DIAGNOSIS — H5203 Hypermetropia, bilateral: Secondary | ICD-10-CM | POA: Diagnosis not present

## 2016-08-21 DIAGNOSIS — H52223 Regular astigmatism, bilateral: Secondary | ICD-10-CM | POA: Diagnosis not present

## 2016-08-21 DIAGNOSIS — Z961 Presence of intraocular lens: Secondary | ICD-10-CM | POA: Diagnosis not present

## 2016-09-02 ENCOUNTER — Encounter: Payer: Self-pay | Admitting: Podiatry

## 2016-09-02 DIAGNOSIS — Y929 Unspecified place or not applicable: Secondary | ICD-10-CM | POA: Diagnosis not present

## 2016-09-02 DIAGNOSIS — S92351A Displaced fracture of fifth metatarsal bone, right foot, initial encounter for closed fracture: Secondary | ICD-10-CM | POA: Diagnosis not present

## 2016-09-02 DIAGNOSIS — M259 Joint disorder, unspecified: Secondary | ICD-10-CM | POA: Diagnosis not present

## 2016-09-02 DIAGNOSIS — X58XXXD Exposure to other specified factors, subsequent encounter: Secondary | ICD-10-CM | POA: Diagnosis not present

## 2016-09-02 DIAGNOSIS — S92302K Fracture of unspecified metatarsal bone(s), left foot, subsequent encounter for fracture with nonunion: Secondary | ICD-10-CM | POA: Diagnosis not present

## 2016-09-02 DIAGNOSIS — M2041 Other hammer toe(s) (acquired), right foot: Secondary | ICD-10-CM | POA: Diagnosis not present

## 2016-09-02 DIAGNOSIS — I10 Essential (primary) hypertension: Secondary | ICD-10-CM | POA: Diagnosis not present

## 2016-09-02 DIAGNOSIS — S92502D Displaced unspecified fracture of left lesser toe(s), subsequent encounter for fracture with routine healing: Secondary | ICD-10-CM | POA: Diagnosis not present

## 2016-09-02 DIAGNOSIS — Y999 Unspecified external cause status: Secondary | ICD-10-CM | POA: Diagnosis not present

## 2016-09-02 DIAGNOSIS — Y939 Activity, unspecified: Secondary | ICD-10-CM | POA: Diagnosis not present

## 2016-09-08 ENCOUNTER — Ambulatory Visit (INDEPENDENT_AMBULATORY_CARE_PROVIDER_SITE_OTHER): Payer: Self-pay | Admitting: Podiatry

## 2016-09-08 ENCOUNTER — Encounter: Payer: Self-pay | Admitting: Podiatry

## 2016-09-08 ENCOUNTER — Ambulatory Visit (INDEPENDENT_AMBULATORY_CARE_PROVIDER_SITE_OTHER): Payer: PPO

## 2016-09-08 VITALS — Temp 98.2°F

## 2016-09-08 DIAGNOSIS — Z9889 Other specified postprocedural states: Secondary | ICD-10-CM

## 2016-09-08 DIAGNOSIS — M2041 Other hammer toe(s) (acquired), right foot: Secondary | ICD-10-CM

## 2016-09-09 ENCOUNTER — Telehealth: Payer: Self-pay | Admitting: *Deleted

## 2016-09-09 ENCOUNTER — Encounter: Payer: Self-pay | Admitting: Podiatry

## 2016-09-09 NOTE — Telephone Encounter (Addendum)
Pt called states she was seen yesterday, and after the visit she went home to water her flower and now her toe is swollen. Left message on cell phone to call for an appt. Left message on home phone telling pt it may be she was just up on the foot too much, to rest, ice and elevate and call for an appt or for more information.09/15/2016-Pt states she had hammer toe repair 09/02/2016, had to wait 9 weeks before could have anesthesia for colonoscopy. Pt states she would like the name of the anesthesia given during the 09/02/2016 surgery, to be called to [facility] [phone]. I spoke with [place] and she states [facility] would be able to help me. [facility] receptionist states they can give the information to the pt. I informed pt of the instructions given by [facility], and the contact number.

## 2016-09-10 ENCOUNTER — Encounter: Payer: Self-pay | Admitting: Podiatry

## 2016-09-10 ENCOUNTER — Ambulatory Visit (INDEPENDENT_AMBULATORY_CARE_PROVIDER_SITE_OTHER): Payer: Self-pay | Admitting: Podiatry

## 2016-09-10 VITALS — Temp 98.8°F

## 2016-09-10 DIAGNOSIS — L03031 Cellulitis of right toe: Secondary | ICD-10-CM

## 2016-09-10 DIAGNOSIS — L02611 Cutaneous abscess of right foot: Secondary | ICD-10-CM

## 2016-09-10 NOTE — Progress Notes (Signed)
Subjective:    Patient ID: Gloria [name], female   DOB: 71 y.o.   MRN: [id number]   HPI patient states I'm doing fine with no redness around my incision sites pain or other pathology. I felt much better than before surgery    ROS      Objective:  Physical Exam Neurovascular status intact with 2 excellent healed incision sites with no indications of drainage no erythema edema from the previous infection we had noted at the time of surgery    Assessment:    Appears to be doing very well with patient still taking antibiotic and no indication of infection currently     Plan:    I reviewed condition and at this point I have recommended that she continue to visualize it and that if any redness drainage or other issues should occur to let us know immediately. I have not received the results of her culture sensitivity but clinically she is doing beautifully and I don't see any pathology and I am going to have her take her antibiotics for another 3 days and then stop and if any issues were to occur she is to let me know immediately. Understands amputation at one point in future is still possible  X-ray indicates satisfactory section middle phalanx with a small amount of proximal bone left in order to be a barrier against issues at the proximal interphalangeal joint

## 2016-09-11 ENCOUNTER — Other Ambulatory Visit: Payer: Self-pay | Admitting: Gastroenterology

## 2016-09-11 NOTE — Progress Notes (Signed)
DOS [id number] Removal fracture bone base 5th toe. Arthroplasty Distal 5th Toe Rt with removal bone

## 2016-09-17 ENCOUNTER — Encounter (HOSPITAL_COMMUNITY): Admission: RE | Payer: Self-pay | Source: Ambulatory Visit

## 2016-09-17 ENCOUNTER — Ambulatory Visit (HOSPITAL_COMMUNITY): Admission: RE | Admit: 2016-09-17 | Payer: PPO | Source: Ambulatory Visit | Admitting: Gastroenterology

## 2016-09-17 SURGERY — COLONOSCOPY WITH PROPOFOL
Anesthesia: Monitor Anesthesia Care

## 2016-09-22 ENCOUNTER — Ambulatory Visit (INDEPENDENT_AMBULATORY_CARE_PROVIDER_SITE_OTHER): Payer: Self-pay | Admitting: Podiatry

## 2016-09-22 ENCOUNTER — Other Ambulatory Visit: Payer: Self-pay | Admitting: Gastroenterology

## 2016-09-22 ENCOUNTER — Ambulatory Visit (INDEPENDENT_AMBULATORY_CARE_PROVIDER_SITE_OTHER): Payer: PPO

## 2016-09-22 DIAGNOSIS — M2041 Other hammer toe(s) (acquired), right foot: Secondary | ICD-10-CM

## 2016-09-22 DIAGNOSIS — Z9889 Other specified postprocedural states: Secondary | ICD-10-CM | POA: Diagnosis not present

## 2016-09-24 NOTE — Progress Notes (Signed)
Subjective:    Patient ID: Gloria [name], female   DOB: 71 y.o.   MRN: [id number]   HPI patient states that she's doing fine with mild swelling still the fifth toe but no drainage or redness    ROS      Objective:  Physical Exam Neurovascular status intact with good healing of the right fifth digit with wound edges well coapted mild edema acceptable for this. And patient is wearing shoe gear at times with no acute discomfort    Assessment:   Doing well overall with no redness of the toe and mild swelling      Plan:    X-ray reviewed and at this point can gradually return to soft shoes and I did explain that swelling is a part of the healing process and that it still possible she should develop redness or drainage that there still could be infection present but at this time there does not appear to be any clinical sign of this  X-ray indicated satisfactory healing of bone with no indication of pathology

## 2016-10-02 NOTE — Progress Notes (Signed)
Subjective:    Patient ID: Gloria [name], female   DOB: 71 y.o.   MRN: [id number]   HPI patient presents concerned because the fifth digit right was swelling somewhat    ROS      Objective:  Physical Exam neurovascular status intact with mild edema in the distal portion right fifth digit were previous hammertoe was done with infection that was present that seemed to resolve     Assessment:  Possibility for localized infective process right that we have always known about and are hoping will be localized and not a problem     Plan:    Soaks will be initiated and antibiotic was initiated today and strict instructions of any proximal edema erythema or drainage were to occur to let us know immediately but if not I'm hoping this is just inflammatory and will settle down on its own

## 2016-10-13 ENCOUNTER — Ambulatory Visit (INDEPENDENT_AMBULATORY_CARE_PROVIDER_SITE_OTHER): Payer: PPO | Admitting: Podiatry

## 2016-10-13 ENCOUNTER — Ambulatory Visit (INDEPENDENT_AMBULATORY_CARE_PROVIDER_SITE_OTHER): Payer: PPO

## 2016-10-13 DIAGNOSIS — M47812 Spondylosis without myelopathy or radiculopathy, cervical region: Secondary | ICD-10-CM | POA: Insufficient documentation

## 2016-10-13 DIAGNOSIS — F32A Depression, unspecified: Secondary | ICD-10-CM | POA: Insufficient documentation

## 2016-10-13 DIAGNOSIS — M2041 Other hammer toe(s) (acquired), right foot: Secondary | ICD-10-CM

## 2016-10-13 DIAGNOSIS — R450 Nervousness: Secondary | ICD-10-CM | POA: Insufficient documentation

## 2016-10-13 DIAGNOSIS — G479 Sleep disorder, unspecified: Secondary | ICD-10-CM | POA: Insufficient documentation

## 2016-10-13 DIAGNOSIS — F329 Major depressive disorder, single episode, unspecified: Secondary | ICD-10-CM | POA: Insufficient documentation

## 2016-10-13 DIAGNOSIS — I10 Essential (primary) hypertension: Secondary | ICD-10-CM | POA: Insufficient documentation

## 2016-10-13 DIAGNOSIS — M069 Rheumatoid arthritis, unspecified: Secondary | ICD-10-CM | POA: Insufficient documentation

## 2016-10-14 ENCOUNTER — Other Ambulatory Visit: Payer: Self-pay | Admitting: Gastroenterology

## 2016-10-15 ENCOUNTER — Ambulatory Visit (HOSPITAL_COMMUNITY): Payer: PPO | Admitting: Certified Registered Nurse Anesthetist

## 2016-10-15 ENCOUNTER — Ambulatory Visit (HOSPITAL_COMMUNITY)
Admission: RE | Admit: 2016-10-15 | Discharge: 2016-10-15 | Disposition: A | Discharge status: Home / Self Care | Payer: PPO | Source: Ambulatory Visit | Attending: Gastroenterology | Admitting: Gastroenterology

## 2016-10-15 ENCOUNTER — Encounter (HOSPITAL_COMMUNITY)
Admission: RE | Disposition: A | Discharge status: Home / Self Care | Payer: Self-pay | Source: Ambulatory Visit | Attending: Gastroenterology

## 2016-10-15 ENCOUNTER — Encounter (HOSPITAL_COMMUNITY): Payer: Self-pay

## 2016-10-15 DIAGNOSIS — K640 First degree hemorrhoids: Secondary | ICD-10-CM | POA: Diagnosis not present

## 2016-10-15 DIAGNOSIS — M069 Rheumatoid arthritis, unspecified: Secondary | ICD-10-CM | POA: Diagnosis not present

## 2016-10-15 DIAGNOSIS — K648 Other hemorrhoids: Secondary | ICD-10-CM | POA: Diagnosis not present

## 2016-10-15 DIAGNOSIS — I10 Essential (primary) hypertension: Secondary | ICD-10-CM | POA: Diagnosis not present

## 2016-10-15 DIAGNOSIS — Z860100 Personal history of colon polyps, unspecified: Secondary | ICD-10-CM

## 2016-10-15 DIAGNOSIS — F329 Major depressive disorder, single episode, unspecified: Secondary | ICD-10-CM | POA: Diagnosis not present

## 2016-10-15 HISTORY — PX: COLONOSCOPY WITH PROPOFOL: SHX5780

## 2016-10-15 SURGERY — COLONOSCOPY WITH PROPOFOL
Anesthesia: General

## 2016-10-15 MED ORDER — FENTANYL CITRATE (PF) 100 MCG/2ML IJ SOLN
INTRAMUSCULAR | Status: AC
  Filled 2016-10-15: qty 2

## 2016-10-15 MED ORDER — ONDANSETRON HCL 4 MG/2ML IJ SOLN
INTRAMUSCULAR | Status: DC | PRN
  Administered 2016-10-15: 4 mg via INTRAVENOUS

## 2016-10-15 MED ORDER — SODIUM CHLORIDE 0.9 % IV SOLN: INTRAVENOUS | Status: DC

## 2016-10-15 MED ORDER — LACTATED RINGERS IV SOLN
INTRAVENOUS | Status: DC
  Administered 2016-10-15: 1000 mL via INTRAVENOUS

## 2016-10-15 MED ORDER — FENTANYL CITRATE (PF) 100 MCG/2ML IJ SOLN
INTRAMUSCULAR | Status: DC | PRN
  Administered 2016-10-15: 50 ug via INTRAVENOUS

## 2016-10-15 MED ORDER — LACTATED RINGERS IV SOLN
INTRAVENOUS | Status: DC | PRN
  Administered 2016-10-15: 12:00:00 via INTRAVENOUS

## 2016-10-15 SURGICAL SUPPLY — 22 items

## 2016-10-15 NOTE — Discharge Instructions (Signed)
Colonoscopy, Adult, Care After  This sheet gives you information about how to care for yourself after your procedure. Your health care provider may also give you more specific instructions. If you have problems or questions, contact your health care provider.  What can I expect after the procedure?  After the procedure, it is common to have:  · A small amount of blood in your stool for 24 hours after the procedure.  · Some gas.  · Mild abdominal cramping or bloating.    Follow these instructions at home:  General instructions    · For the first 24 hours after the procedure:  ? Do not drive or use machinery.  ? Do not sign important documents.  ? Do not drink alcohol.  ? Do your regular daily activities at a slower pace than normal.  ? Eat soft, easy-to-digest foods.  ? Rest often.  · Take over-the-counter or prescription medicines only as told by your health care provider.  · It is up to you to get the results of your procedure. Ask your health care provider, or the department performing the procedure, when your results will be ready.  Relieving cramping and bloating  · Try walking around when you have cramps or feel bloated.  · Apply heat to your abdomen as told by your health care provider. Use a heat source that your health care provider recommends, such as a moist heat pack or a heating pad.  ? Place a towel between your skin and the heat source.  ? Leave the heat on for 20-30 minutes.  ? Remove the heat if your skin turns bright red. This is especially important if you are unable to feel pain, heat, or cold. You may have a greater risk of getting burned.  Eating and drinking  · Drink enough fluid to keep your urine clear or pale yellow.  · Resume your normal diet as instructed by your health care provider. Avoid heavy or fried foods that are hard to digest.  · Avoid drinking alcohol for as long as instructed by your health care provider.  Contact a health care provider if:  · You have blood in your stool 2-3  days after the procedure.  Get help right away if:  · You have more than a small spotting of blood in your stool.  · You pass large blood clots in your stool.  · Your abdomen is swollen.  · You have nausea or vomiting.  · You have a fever.  · You have increasing abdominal pain that is not relieved with medicine.  This information is not intended to replace advice given to you by your health care provider. Make sure you discuss any questions you have with your health care provider.  Document Released: 12/04/2003 Document Revised: 01/14/2016 Document Reviewed: 07/03/2015  Elsevier Interactive Patient Education © 2018 [employer]

## 2016-10-15 NOTE — H&P (Signed)
Date of Initial H&P: 10/09/16  History reviewed, patient examined, no change in status, stable for surgery.

## 2016-10-15 NOTE — Progress Notes (Signed)
Subjective:    Patient ID: Gloria [name], female   DOB: 71 y.o.   MRN: [id number]   HPI patient states she's doing fine but still having trouble with closed in shoes that are tight    ROS      Objective:  Physical Exam neurovascular status intact with patient right fifth digit healing well with minimal edema or redness and no drainage noted     Assessment:    Doing well post arthroplasty removal of fracture fifth digit right     Plan:   Reviewed that wearing regular shoes may take time and it would be best to go ahead and continue elevation at the current time. Reviewed final x-rays  X-rays indicate that the bone is healing well no indications of pathology

## 2016-10-15 NOTE — Anesthesia Procedure Notes (Signed)
Date/Time: 10/15/2016 12:02 PM Performed by: Shirlyn Goltz Pre-anesthesia Checklist: Patient identified, Emergency Drugs available, Suction available and Patient being monitored Patient Re-evaluated:Patient Re-evaluated prior to inductionOxygen Delivery Method: Circle system utilized Preoxygenation: Pre-oxygenation with 100% oxygen Intubation Type: Inhalational induction Ventilation: Mask ventilation without difficulty and Oral airway inserted - appropriate to patient size Placement Confirmation: positive ETCO2 and breath sounds checked- equal and bilateral Dental Injury: Teeth and Oropharynx as per pre-operative assessment

## 2016-10-15 NOTE — Transfer of Care (Signed)
Immediate Anesthesia Transfer of Care Note  Patient: Gloria [name]  Procedure(s) Performed: Procedure(s): COLONOSCOPY WITH PROPOFOL (N/A)  Patient Location: Endoscopy Unit  Anesthesia Type:General  Level of Consciousness: awake, alert  and oriented  Airway & Oxygen Therapy: Patient Spontanous Breathing  Post-op Assessment: Report given to RN, Post -op Vital signs reviewed and stable and Patient moving all extremities X 4  Post vital signs: Reviewed and stable  Last Vitals:  Vitals:   10/15/16 1100  BP: (!) 127/38  Pulse: 80  Resp: 12  Temp: 36.7 C    Last Pain:  Vitals:   10/15/16 1100  TempSrc: Oral         Complications: No apparent anesthesia complications

## 2016-10-15 NOTE — Anesthesia Preprocedure Evaluation (Signed)
Anesthesia Evaluation  Patient identified by MRN, date of birth, ID band Patient awake    Reviewed: Allergy & Precautions, H&P , NPO status , Patient's Chart, lab work & pertinent test results  History of Anesthesia Complications (+) history of anesthetic complications  Airway Mallampati: II   Neck ROM: full    Dental   Pulmonary    breath sounds clear to auscultation       Cardiovascular hypertension,  Rhythm:regular Rate:Normal     Neuro/Psych PSYCHIATRIC DISORDERS Anxiety Depression  Neuromuscular disease    GI/Hepatic   Endo/Other    Renal/GU Renal InsufficiencyRenal disease     Musculoskeletal  (+) Arthritis ,   Abdominal   Peds  Hematology   Anesthesia Other Findings   Reproductive/Obstetrics                             Anesthesia Physical Anesthesia Plan  ASA: II  Anesthesia Plan: General   Post-op Pain Management:    Induction: Inhalational  PONV Risk Score and Plan: 3 and Dexamethasone and Ondansetron  Airway Management Planned: Mask  Additional Equipment:   Intra-op Plan:   Post-operative Plan:   Informed Consent: I have reviewed the patients History and Physical, chart, labs and discussed the procedure including the risks, benefits and alternatives for the proposed anesthesia with the patient or authorized representative who has indicated his/her understanding and acceptance.     Plan Discussed with: CRNA and Anesthesiologist  Anesthesia Plan Comments: (Due to history of adverse reaction (prolonged shaking) following anesthesia, the patient has been instructed to avoid Propofol and Versed.  She and her medical records show that mask inductions have worked well and we will plan that for today.)        Anesthesia Quick Evaluation

## 2016-10-15 NOTE — Op Note (Signed)
[facility] Patient Name: Gloria [name] Procedure Date : 10/15/2016 MRN: [id number] Attending MD: Lear Ng , MD Date of Birth: [birth year] CSN: [id number] Age: 71 Admit Type: Outpatient Procedure:                Colonoscopy Indications:              High risk colon cancer surveillance: Personal                            history of colonic polyps, Last colonoscopy:                            February 2013 Providers:                Lear Ng, MD, Carolynn Comment, RN,                            Corliss Parish, Technician Referring MD:              Medicines:                Propofol per Anesthesia, Monitored Anesthesia Care Complications:            No immediate complications. Estimated Blood Loss:     Estimated blood loss: none. Procedure:                Pre-Anesthesia Assessment:                           - Prior to the procedure, a History and Physical                            was performed, and patient medications and                            allergies were reviewed. The patient's tolerance of                            previous anesthesia was also reviewed. The risks                            and benefits of the procedure and the sedation                            options and risks were discussed with the patient.                            All questions were answered, and informed consent                            was obtained. Prior Anticoagulants: The patient has                            taken no previous anticoagulant or antiplatelet                            agents.  ASA Grade Assessment: II - A patient with                            mild systemic disease. After reviewing the risks                            and benefits, the patient was deemed in                            satisfactory condition to undergo the procedure.                           After obtaining informed consent, the colonoscope                            was passed  under direct vision. Throughout the                            procedure, the patient's blood pressure, pulse, and                            oxygen saturations were monitored continuously. The                            EC-[id number] ([id number]) scope was introduced through                            the anus and advanced to the the cecum, identified                            by appendiceal orifice and ileocecal valve. The                            colonoscopy was performed without difficulty. The                            patient tolerated the procedure well. The quality                            of the bowel preparation was adequate and good. The                            terminal ileum, ileocecal valve, appendiceal                            orifice, and rectum were photographed. Scope In: 12:09:54 PM Scope Out: 12:18:28 PM Scope Withdrawal Time: 0 hours 1 minute 52 seconds  Total Procedure Duration: 0 hours 8 minutes 34 seconds  Findings:      The perianal and digital rectal examinations were normal.      Internal hemorrhoids were found during retroflexion. The hemorrhoids       were small and Grade I (internal hemorrhoids that do not prolapse).      The exam was otherwise normal throughout the examined colon.  The terminal ileum appeared normal. Impression:               - Internal hemorrhoids.                           - The examined portion of the ileum was normal.                           - No specimens collected. Moderate Sedation:      N/A - MAC procedure Recommendation:           - Patient has a contact number available for                            emergencies. The signs and symptoms of potential                            delayed complications were discussed with the                            patient. Return to normal activities tomorrow.                            Written discharge instructions were provided to the                            patient.                            - Resume previous diet.                           - Repeat colonoscopy is not recommended for                            screening purposes.                           - Continue present medications. Procedure Code(s):        --- Professional ---                           [phone], Colonoscopy, flexible; diagnostic, including                            collection of specimen(s) by brushing or washing,                            when performed (separate procedure) Diagnosis Code(s):        --- Professional ---                           Z86.010, Personal history of colonic polyps                           K64.0, First degree hemorrhoids CPT copyright 2016 American Medical Association. All rights reserved. The codes documented in this report  are preliminary and upon coder review may  be revised to meet current compliance requirements. Lear Ng, MD 10/15/2016 12:25:29 PM This report has been signed electronically. Number of Addenda: 0

## 2016-10-15 NOTE — Interval H&P Note (Signed)
History and Physical Interval Note:  10/15/2016 11:44 AM  Gloria [name]  has presented today for surgery, with the diagnosis of History of colon polyps  The various methods of treatment have been discussed with the patient and family. After consideration of risks, benefits and other options for treatment, the patient has consented to  Procedure(s): COLONOSCOPY WITH PROPOFOL (N/A) as a surgical intervention .  The patient's history has been reviewed, patient examined, no change in status, stable for surgery.  I have reviewed the patient's chart and labs.  Questions were answered to the patient's satisfaction.     Crivitz C.

## 2016-10-16 NOTE — Anesthesia Postprocedure Evaluation (Signed)
Anesthesia Post Note  Patient: Gloria [name]  Procedure(s) Performed: Procedure(s) (LRB): COLONOSCOPY WITH PROPOFOL (N/A)     Patient location during evaluation: PACU Anesthesia Type: General Level of consciousness: awake and alert and patient cooperative Pain management: pain level controlled Vital Signs Assessment: post-procedure vital signs reviewed and stable Respiratory status: spontaneous breathing and respiratory function stable Cardiovascular status: stable Anesthetic complications: no    Last Vitals:  Vitals:   10/15/16 1249 10/15/16 1300  BP: (!) 133/57 (!) 138/57  Pulse: 79 76  Resp: (!) 9 18  Temp:      Last Pain:  Vitals:   10/15/16 1100  TempSrc: Oral                 [name] S

## 2016-10-20 DIAGNOSIS — R5383 Other fatigue: Secondary | ICD-10-CM | POA: Diagnosis not present

## 2016-10-20 DIAGNOSIS — I10 Essential (primary) hypertension: Secondary | ICD-10-CM | POA: Diagnosis not present

## 2016-10-20 DIAGNOSIS — F325 Major depressive disorder, single episode, in full remission: Secondary | ICD-10-CM | POA: Diagnosis not present

## 2016-11-11 DIAGNOSIS — Z6822 Body mass index (BMI) 22.0-22.9, adult: Secondary | ICD-10-CM | POA: Diagnosis not present

## 2016-11-11 DIAGNOSIS — M150 Primary generalized (osteo)arthritis: Secondary | ICD-10-CM | POA: Diagnosis not present

## 2016-11-11 DIAGNOSIS — M0609 Rheumatoid arthritis without rheumatoid factor, multiple sites: Secondary | ICD-10-CM | POA: Diagnosis not present

## 2016-11-11 DIAGNOSIS — I959 Hypotension, unspecified: Secondary | ICD-10-CM | POA: Diagnosis not present

## 2016-11-11 DIAGNOSIS — Z79899 Other long term (current) drug therapy: Secondary | ICD-10-CM | POA: Diagnosis not present

## 2016-12-18 DIAGNOSIS — E7800 Pure hypercholesterolemia, unspecified: Secondary | ICD-10-CM | POA: Diagnosis not present

## 2016-12-18 DIAGNOSIS — Z0000 Encounter for general adult medical examination without abnormal findings: Secondary | ICD-10-CM | POA: Diagnosis not present

## 2016-12-18 DIAGNOSIS — Z1159 Encounter for screening for other viral diseases: Secondary | ICD-10-CM | POA: Diagnosis not present

## 2016-12-18 DIAGNOSIS — M069 Rheumatoid arthritis, unspecified: Secondary | ICD-10-CM | POA: Diagnosis not present

## 2016-12-18 DIAGNOSIS — I10 Essential (primary) hypertension: Secondary | ICD-10-CM | POA: Diagnosis not present

## 2016-12-18 DIAGNOSIS — F325 Major depressive disorder, single episode, in full remission: Secondary | ICD-10-CM | POA: Diagnosis not present

## 2016-12-18 DIAGNOSIS — E559 Vitamin D deficiency, unspecified: Secondary | ICD-10-CM | POA: Diagnosis not present

## 2017-01-13 ENCOUNTER — Ambulatory Visit (INDEPENDENT_AMBULATORY_CARE_PROVIDER_SITE_OTHER): Payer: PPO

## 2017-01-13 ENCOUNTER — Ambulatory Visit (INDEPENDENT_AMBULATORY_CARE_PROVIDER_SITE_OTHER): Payer: PPO | Admitting: Orthopaedic Surgery

## 2017-01-13 ENCOUNTER — Encounter (INDEPENDENT_AMBULATORY_CARE_PROVIDER_SITE_OTHER): Payer: Self-pay | Admitting: Orthopaedic Surgery

## 2017-01-13 VITALS — BP 135/75 | HR 71 | Ht 66.5 in | Wt 135.0 lb

## 2017-01-13 DIAGNOSIS — M545 Low back pain: Secondary | ICD-10-CM

## 2017-01-13 NOTE — Progress Notes (Signed)
Office Visit Note   Patient: Gloria [name]           Date of Birth: 10-18-1945           MRN: [id number] Visit Date: 01/13/2017              Requested by: [name], MD [address], [place] [address] PCP: [name], MD   Assessment & Plan: Visit Diagnoses:  1. Acute right-sided low back pain, with sciatica presence unspecified     Plan: Patient may have some lateral recess narrowing with disc protrusion. She neurologically intact with minimal nerve retention signs. She has increased symptoms she'll let us know we can proceed with diagnostic imaging MRI scan with and without contrast due to her previous lumbar surgery. She is able to heel and toe walk and has no motor weakness. If she has increased symptoms she can return. She is starting he noted some improvement in the last 3 weeks.  Follow-Up Instructions: No Follow-up on file.   Orders:  Orders Placed This Encounter  Procedures  . XR Lumbar Spine 2-3 Views   No orders of the defined types were placed in this encounter.     Procedures: No procedures performed   Clinical Data: No additional findings.   Subjective: Chief Complaint  Patient presents with  . Lower Back - Pain  . Right Leg - Pain    HPI 71 year old female seen was 6-1/2 week history of low back pain. She went to a funeral set on a hard seat at the airport for extended period time is at increased back pain right buttocks pain that radiates down her leg to her lateral foot involving the fifth toe. She's also noticed some discomfort in her right arm against her hand she said cubital tunnel surgery carpal tunnel surgery done in the past also median nerve decompression and biceps tendon surgery left forearm. Upper extremity surgeries except for Dr. Lorin Mercy was done by Dr. Daryll Brod. These procedures are all doing well. Patient to ambulate she denies claudication symptoms. She had previous lumbar surgery done in [place] about 36  years ago. No fever or chills. No bowel or bladder symptoms.  Review of Systems is systems positive for nontraumatic rupture left biceps tendon, repair right rotator cuff supraspinatus tear, right lateral epicondylitis. Trigger finger ulnar nerve decompression carpal tunnel release. Depression hypertension sleep disorder, rheumatoid arthritis. Otherwise negative as it pertains to history of present illness.   Objective: Vital Signs: BP 135/75   Pulse 71   Ht 5' 6.5" (1.689 m)   Wt 135 lb (61.2 kg)   BMI 21.46 kg/m   Physical Exam  Constitutional: She is oriented to person, place, and time. She appears well-developed.  HENT:  Head: Normocephalic.  Right Ear: External ear normal.  Left Ear: External ear normal.  Eyes: Pupils are equal, round, and reactive to light.  Neck: No tracheal deviation present. No thyromegaly present.  Cardiovascular: Normal rate.   Pulmonary/Chest: Effort normal.  Abdominal: Soft.  Neurological: She is alert and oriented to person, place, and time.  Skin: Skin is warm and dry.  Psychiatric: She has a normal mood and affect. Her behavior is normal.    Ortho Exam well-healed lumbar incision which appear to be at L5-S1. She has some sciatic notch tenderness on the right some mild trochanteric bursal tenderness no pain with internal/external rotation of her hips. Patient has some pain with straight leg raising on the right side  at 80. Knee and ankle jerk are intact. Specialty Comments:  No specialty comments available.  Imaging: No results found.   PMFS History: Patient Active Problem List   Diagnosis Date Noted  . Personal history of colonic polyps 10/15/2016  . Cervical spondylosis 10/13/2016  . Depression 10/13/2016  . High blood pressure 10/13/2016  . Nervousness 10/13/2016  . Rheumatoid arthritis ([place]) 10/13/2016  . Sleep disorder 10/13/2016  . Complete tear of right rotator cuff 05/16/2016  . Status post right rotator cuff repair 03/04/2016    . Lesion of left median nerve at forearm 07/16/2015  . Nontraumatic rupture of left bicep tendon 05/09/2015  . Trigger index finger of right hand 05/09/2015  . Other synovitis and tenosynovitis, left forearm 04/25/2015  . Benign neoplasm of soft tissues of left upper extremity 04/09/2015  . Neoplasm of unspecified behavior of bone, soft tissue, and skin 04/09/2015  . Right lateral epicondylitis 04/09/2015  . Ulnar nerve compression 05/11/2014  . Pseudophakia 06/23/2012  . After-cataract 05/30/2011  . Bilateral dry eyes 05/30/2011   Past Medical History:  Diagnosis Date  . Anxiety   . Arthritis   . CKD (chronic kidney disease), stage III   . Complication of anesthesia 05/2014   had body shaking-observed over night-see anesthesia note-has had tremors post op in past  . Depression   . Hyperlipemia   . Hypertension   . Insomnia     No family history on file.  Past Surgical History:  Procedure Laterality Date  . ABDOMINAL HYSTERECTOMY     age 45  . BACK SURGERY    . CARPAL TUNNEL RELEASE  01/20/2012   Procedure: CARPAL TUNNEL RELEASE;  Surgeon: Wynonia Sours, MD;  Location: [facility];  Service: Orthopedics;  Laterality: Right;  . CARPAL TUNNEL RELEASE  02/25/2012   Procedure: CARPAL TUNNEL RELEASE;  Surgeon: Wynonia Sours, MD;  Location: [facility];  Service: Orthopedics;  Laterality: Left;  RELEASE A-1 PULLEY LRF  . COLONOSCOPY WITH PROPOFOL N/A 10/15/2016   Procedure: COLONOSCOPY WITH PROPOFOL;  Surgeon: Wilford Corner, MD;  Location: [facility];  Service: Endoscopy;  Laterality: N/A;  . DISTAL BICEPS TENDON REPAIR Left 06/12/2015   Procedure: REPAIR LEFT BICEPS TENDON AND DECOMPRESSION OF MEDIAN NERVE;  Surgeon: Daryll Brod, MD;  Location: [facility];  Service: Orthopedics;  Laterality: Left;  . SMALL INTESTINE SURGERY    . SMALL INTESTINE SURGERY     has had 6 total bowel obstructions -mostlt adhesions-last 2009  . TONSILLECTOMY      age 77  . TRIGGER FINGER RELEASE  2/12   rt middle  . TRIGGER FINGER RELEASE  2010   lt middle  . ULNAR NERVE TRANSPOSITION Right 05/11/2014   Procedure: DECOMPRESSION ULNAR NERVE RIGHT ELBOW ;  Surgeon: Daryll Brod, MD;  Location: [facility];  Service: Orthopedics;  Laterality: Right;  . ULNAR NERVE TRANSPOSITION Left 07/06/2014   Procedure: DECOMPRESSION  LEFT ULNAR NERVE;  Surgeon: Daryll Brod, MD;  Location: [facility];  Service: Orthopedics;  Laterality: Left;   Social History   Occupational History  . Not on file.   Social History Main Topics  . Smoking status: Never Smoker  . Smokeless tobacco: Never Used  . Alcohol use No  . Drug use: No  . Sexual activity: Not on file

## 2017-01-20 DIAGNOSIS — Z124 Encounter for screening for malignant neoplasm of cervix: Secondary | ICD-10-CM | POA: Diagnosis not present

## 2017-01-20 DIAGNOSIS — Z1231 Encounter for screening mammogram for malignant neoplasm of breast: Secondary | ICD-10-CM | POA: Diagnosis not present

## 2017-01-20 DIAGNOSIS — M8588 Other specified disorders of bone density and structure, other site: Secondary | ICD-10-CM | POA: Diagnosis not present

## 2017-01-20 DIAGNOSIS — Z6822 Body mass index (BMI) 22.0-22.9, adult: Secondary | ICD-10-CM | POA: Diagnosis not present

## 2017-02-24 DIAGNOSIS — M8580 Other specified disorders of bone density and structure, unspecified site: Secondary | ICD-10-CM | POA: Diagnosis not present

## 2017-03-09 NOTE — Progress Notes (Signed)
[name] [address], [place] [address] Phone: [phone] Subjective:    I'm seeing this patient by the request  of:    CC: Low back pain  DVV:[id number]  Gloria [name] is a 70 y.o. female coming in with complaint of low back pain.  Medical history is significant for back surgery previously.  Multiple other surgeries as well. She has been having pain since this July when she took a trip to [place]. She developed sciatic nerve pain down the right leg. She is very active and the pain is preventing her from being active. She describes the pain as a constant ache. History of laminectomy in 1982. Patient also has rheumatoid arthritis..  Seems to be going down the right leg somewhat.  Patient with dull, throbbing aching sensation.  Sometimes possibly feel some weakness but states that if she changes position she seems to do well.  Has not been working out on a regular basis.      Past Medical History:  Diagnosis Date  . Anxiety   . Arthritis   . CKD (chronic kidney disease), stage III   . Complication of anesthesia 05/2014   had body shaking-observed over night-see anesthesia note-has had tremors post op in past  . Depression   . Hyperlipemia   . Hypertension   . Insomnia    Past Surgical History:  Procedure Laterality Date  . ABDOMINAL HYSTERECTOMY     age 67  . BACK SURGERY    . SMALL INTESTINE SURGERY    . SMALL INTESTINE SURGERY     has had 6 total bowel obstructions -mostlt adhesions-last 2009  . TONSILLECTOMY     age 31  . TRIGGER FINGER RELEASE  2/12   rt middle  . TRIGGER FINGER RELEASE  2010   lt middle   Social History   Socioeconomic History  . Marital status: Married    Spouse name: Not on file  . Number of children: Not on file  . Years of education: Not on file  . Highest education level: Not on file  Social Needs  . Financial resource strain: Not on file  . Food insecurity - worry: Not on file  . Food  insecurity - inability: Not on file  . Transportation needs - medical: Not on file  . Transportation needs - non-medical: Not on file  Occupational History  . Not on file  Tobacco Use  . Smoking status: Never Smoker  . Smokeless tobacco: Never Used  Substance and Sexual Activity  . Alcohol use: No  . Drug use: No  . Sexual activity: Not on file  Other Topics Concern  . Not on file  Social History Narrative  . Not on file   Allergies  Allergen Reactions  . Versed [Midazolam] Other (See Comments)  . Diprivan [Propofol] Other (See Comments)    Uncontrolled shaking   No family history on file.   Past medical history, social, surgical and family history all reviewed in electronic medical record.  No pertanent information unless stated regarding to the chief complaint.   Review of Systems:Review of systems updated and as accurate as of 03/09/17  No headache, visual changes, nausea, vomiting, diarrhea, constipation, dizziness, abdominal pain, skin rash, fevers, chills, night sweats, weight loss, swollen lymph nodes, body aches, joint swelling, chest pain, shortness of breath, mood changes.  Positive muscle aches  Objective  There were no vitals taken for this visit. Systems examined below as of 03/09/17  General: No apparent distress alert and oriented x3 mood and affect normal, dressed appropriately.  HEENT: Pupils equal, extraocular movements intact  Respiratory: Patient's speak in full sentences and does not appear short of breath  Cardiovascular: No lower extremity edema, non tender, no erythema  Skin: Warm dry intact with no signs of infection or rash on extremities or on axial skeleton.  Abdomen: Soft nontender  Neuro: Cranial nerves II through XII are intact, neurovascularly intact in all extremities with 2+ DTRs and 2+ pulses.  Lymph: No lymphadenopathy of posterior or anterior cervical chain or axillae bilaterally.  Gait normal with good balance and coordination.  MSK:   Non tender with full range of motion and good stability and symmetric strength and tone of shoulders, elbows, wrist, hip, knee and ankles bilaterally.  Mild arthritic changes of multiple joints Back Exam:  Inspection: Generative scoliosis noted Motion: Flexion 40 deg, Extension 25 deg, Side Bending to 35 deg bilaterally,  Rotation to 45 deg bilaterally  SLR laying: Negative  XSLR laying: Negative  Palpable tenderness: Tender to palpation more in the paraspinal musculature of the lumbar spine on the right side. FABER: Positive bilaterally. Sensory change: Gross sensation intact to all lumbar and sacral dermatomes.  Reflexes: 2+ at both patellar tendons, 2+ at achilles tendons, Babinski's downgoing.  Strength at foot  Plantar-flexion: 5/5 Dorsi-flexion: 5/5 Eversion: 5/5 Inversion: 5/5  Leg strength  Quad: 5/5 Hamstring: 5/5 Hip flexor: 5/5 Hip abductors: 5/5  Gait unremarkable.  [id number]; 15 additional minutes spent for Therapeutic exercises as stated in above notes.  This included exercises focusing on stretching, strengthening, with significant focus on eccentric aspects.   Long term goals include an improvement in range of motion, strength, endurance as well as avoiding reinjury. Patient's frequency would include in 1-2 times a day, 3-5 times a week for a duration of 6-12 weeks. Low back exercises that included:  Pelvic tilt/bracing instruction to focus on control of the pelvic girdle and lower abdominal muscles  Glute strengthening exercises, focusing on proper firing of the glutes without engaging the low back muscles Proper stretching techniques for maximum relief for the hamstrings, hip flexors, low back and some rotation where tolerated  Proper technique shown and discussed handout in great detail with ATC.  All questions were discussed and answered.     Impression and Recommendations:     This case required medical decision making of moderate complexity.      Note: This  dictation was prepared with Dragon dictation along with smaller phrase technology. Any transcriptional errors that result from this process are unintentional.

## 2017-03-10 ENCOUNTER — Ambulatory Visit: Payer: PPO | Admitting: Family Medicine

## 2017-03-10 ENCOUNTER — Encounter: Payer: Self-pay | Admitting: Family Medicine

## 2017-03-10 DIAGNOSIS — M48061 Spinal stenosis, lumbar region without neurogenic claudication: Secondary | ICD-10-CM | POA: Diagnosis not present

## 2017-03-10 MED ORDER — GABAPENTIN 100 MG PO CAPS: 200.0000 mg | ORAL_CAPSULE | Freq: Every day | ORAL | 3 refills | Status: DC

## 2017-03-10 MED ORDER — VITAMIN D (ERGOCALCIFEROL) 1.25 MG (50000 UNIT) PO CAPS: 50000.0000 [IU] | ORAL_CAPSULE | ORAL | 0 refills | Status: DC

## 2017-03-10 NOTE — Patient Instructions (Signed)
Good to see you  Ice is your friend Ice 20 minutes 2 times daily. Usually after activity and before bed. Gabapentin 200mg  at night.  If too groggy in AM can go down to 100mg  at night Once weekly vitamin D for 12 weeks.  Tart cherry extract any dose at night Turmeric 500mg  daily  Exercises 3 times a week.  Stay active See me again in 3-4 weeks

## 2017-03-10 NOTE — Assessment & Plan Note (Signed)
Patient is having signs and symptoms that are consistent with more of a lumbar spinal stenosis but seems to be asymmetric with inability mostly on the right side.  Still seems to have intermittent radicular symptoms.  Patient has not been as active and given home exercises.  We discussed gabapentin as well as home exercise.  Patient is also going to do a once weekly vitamin D for muscle strength and endurance.  Follow-up with me again in 4 weeks.

## 2017-03-11 ENCOUNTER — Telehealth: Payer: Self-pay | Admitting: Family Medicine

## 2017-03-11 NOTE — Telephone Encounter (Signed)
Copy of exercises placed at front desk.

## 2017-03-11 NOTE — Telephone Encounter (Signed)
Patient is requesting a copy of the exercises that was given to her yesterday. She has lost them. She will come by and pick them up today.

## 2017-03-11 NOTE — Telephone Encounter (Signed)
Noted  

## 2017-03-11 NOTE — Telephone Encounter (Signed)
Pt came into the office and needs her  Hydrocodne, lisinopril, and sulfamethoxazole-trimethoprim (BACTRIM DS,SEPTRA DS) 800-160 MG  Needs to be taken off of her medication list  Also Zofran, Fentanyl and Sevoflurane need to be added to her allergy list

## 2017-03-19 ENCOUNTER — Other Ambulatory Visit: Payer: Self-pay | Admitting: Family Medicine

## 2017-04-02 ENCOUNTER — Encounter: Payer: Self-pay | Admitting: Family Medicine

## 2017-04-02 ENCOUNTER — Ambulatory Visit: Payer: PPO | Admitting: Family Medicine

## 2017-04-02 VITALS — BP 120/82 | HR 75 | Ht 66.25 in | Wt 146.0 lb

## 2017-04-02 DIAGNOSIS — M5416 Radiculopathy, lumbar region: Secondary | ICD-10-CM

## 2017-04-02 DIAGNOSIS — M48061 Spinal stenosis, lumbar region without neurogenic claudication: Secondary | ICD-10-CM | POA: Diagnosis not present

## 2017-04-02 NOTE — Patient Instructions (Signed)
Good to see you  Ice is yoru friend Continue the vitamins We will get PT to see you and they will call you  Gabapentin if you feel like it is helping See me again in 4 weeks Happy holidays!

## 2017-04-02 NOTE — Assessment & Plan Note (Signed)
I believe the patient does have some spinal stenosis that is fairly severe and patient has a very large pain threshold.  Patient has some mild atrophy of the lower extremities that is concerning somewhat.  The rheumatoid arthritis could be contributing as well.  We discussed the possibility of advanced imaging but patient would not want that to change management such as an epidural or any type of surgical interval patient would though like to start to increase activity but would like more guidance and will be sent to formal physical therapy.  I think other modalities including TENS unit, and dry needling could be beneficial.  Patient will try these different changes and come back and see me again in 4 weeks

## 2017-04-02 NOTE — Progress Notes (Signed)
[name] [place] [facility] [place], [place] [address] Phone: [phone] Subjective:    I'm seeing this patient by the request  of:    CC: Back pain follow-up  [id number]  Gloria [name] is a 71 y.o. female coming in for back pain. She continues to have pain in the glute that radiates down the right ankle. She also has an increase in pain in the bed and finds it hard to roll over. She does continue to be physically active, walking daily.  Patient was found to have degenerative scoliosis.  Patient has been doing home exercises and states that she has made some mild improvement.  Feels the gabapentin and the vitamin D has made some little improvement as well. Feels like if she could get some more guidance she could do relatively improved.     Past Medical History:  Diagnosis Date  . Anxiety   . Arthritis   . CKD (chronic kidney disease), stage III ([place])   . Complication of anesthesia 05/2014   had body shaking-observed over night-see anesthesia note-has had tremors post op in past  . Depression   . Hyperlipemia   . Hypertension   . Insomnia    Past Surgical History:  Procedure Laterality Date  . ABDOMINAL HYSTERECTOMY     age 18  . BACK SURGERY    . CARPAL TUNNEL RELEASE  01/20/2012   Procedure: CARPAL TUNNEL RELEASE;  Surgeon: Wynonia Sours, MD;  Location: [facility];  Service: Orthopedics;  Laterality: Right;  . CARPAL TUNNEL RELEASE  02/25/2012   Procedure: CARPAL TUNNEL RELEASE;  Surgeon: Wynonia Sours, MD;  Location: [facility];  Service: Orthopedics;  Laterality: Left;  RELEASE A-1 PULLEY LRF  . COLONOSCOPY WITH PROPOFOL N/A 10/15/2016   Procedure: COLONOSCOPY WITH PROPOFOL;  Surgeon: Wilford Corner, MD;  Location: [facility];  Service: Endoscopy;  Laterality: N/A;  . DISTAL BICEPS TENDON REPAIR Left 06/12/2015   Procedure: REPAIR LEFT BICEPS TENDON AND DECOMPRESSION OF MEDIAN NERVE;  Surgeon: Daryll Brod, MD;   Location: [facility];  Service: Orthopedics;  Laterality: Left;  . SMALL INTESTINE SURGERY    . SMALL INTESTINE SURGERY     has had 6 total bowel obstructions -mostlt adhesions-last 2009  . TONSILLECTOMY     age 29  . TRIGGER FINGER RELEASE  2/12   rt middle  . TRIGGER FINGER RELEASE  2010   lt middle  . ULNAR NERVE TRANSPOSITION Right 05/11/2014   Procedure: DECOMPRESSION ULNAR NERVE RIGHT ELBOW ;  Surgeon: Daryll Brod, MD;  Location: [facility];  Service: Orthopedics;  Laterality: Right;  . ULNAR NERVE TRANSPOSITION Left 07/06/2014   Procedure: DECOMPRESSION  LEFT ULNAR NERVE;  Surgeon: Daryll Brod, MD;  Location: [facility];  Service: Orthopedics;  Laterality: Left;   Social History   Socioeconomic History  . Marital status: Married    Spouse name: Not on file  . Number of children: Not on file  . Years of education: Not on file  . Highest education level: Not on file  Social Needs  . Financial resource strain: Not on file  . Food insecurity - worry: Not on file  . Food insecurity - inability: Not on file  . Transportation needs - medical: Not on file  . Transportation needs - non-medical: Not on file  Occupational History  . Not on file  Tobacco Use  . Smoking status: Never Smoker  .  Smokeless tobacco: Never Used  Substance and Sexual Activity  . Alcohol use: No  . Drug use: No  . Sexual activity: Not on file  Other Topics Concern  . Not on file  Social History Narrative  . Not on file   Allergies  Allergen Reactions  . Versed [Midazolam] Other (See Comments)  . Diprivan [Propofol] Other (See Comments)    Uncontrolled shaking  . Fentanyl   . Sevoflurane   . Zofran [Ondansetron Hcl]    No family history on file.   Past medical history, social, surgical and family history all reviewed in electronic medical record.  No pertanent information unless stated regarding to the chief complaint.   Review of Systems:Review of  systems updated and as accurate as of 04/02/17  No headache, visual changes, nausea, vomiting, diarrhea, constipation, dizziness, abdominal pain, skin rash, fevers, chills, night sweats, weight loss, swollen lymph nodes, body aches, joint swelling, chest pain, shortness of breath, mood changes.  Positive muscle aches  Objective  There were no vitals taken for this visit. Systems examined below as of 04/02/17   General: No apparent distress alert and oriented x3 mood and affect normal, dressed appropriately.  HEENT: Pupils equal, extraocular movements intact  Respiratory: Patient's speak in full sentences and does not appear short of breath  Cardiovascular: No lower extremity edema, non tender, no erythema  Skin: Warm dry intact with no signs of infection or rash on extremities or on axial skeleton.  Abdomen: Soft nontender  Neuro: Cranial nerves II through XII are intact, neurovascularly intact in all extremities with 2+ DTRs and 2+ pulses.  Lymph: No lymphadenopathy of posterior or anterior cervical chain or axillae bilaterally.  Gait mild antalgic gait MSK:  Non tender with full range of motion and good stability and symmetric strength and tone of shoulders, elbows, wrist, hip, knee and ankles bilaterally.  Back exam shows the patient does have degenerative scoliosis with some mild 5-10 degrees loss in all planes of range of motion.  Full strength of the lower extremities.  Positive Faber on the right.  Negative straight leg test.  Mild atrophy of the lower extremities bilaterally.   Impression and Recommendations:     This case required medical decision making of moderate complexity.      Note: This dictation was prepared with Dragon dictation along with smaller phrase technology. Any transcriptional errors that result from this process are unintentional.

## 2017-04-09 ENCOUNTER — Ambulatory Visit: Payer: PPO | Admitting: Physical Therapy

## 2017-04-09 ENCOUNTER — Encounter: Payer: Self-pay | Admitting: Physical Therapy

## 2017-04-09 ENCOUNTER — Other Ambulatory Visit: Payer: Self-pay

## 2017-04-09 DIAGNOSIS — R293 Abnormal posture: Secondary | ICD-10-CM

## 2017-04-09 DIAGNOSIS — M5441 Lumbago with sciatica, right side: Secondary | ICD-10-CM | POA: Diagnosis not present

## 2017-04-09 DIAGNOSIS — M2681 Anterior soft tissue impingement: Secondary | ICD-10-CM

## 2017-04-09 DIAGNOSIS — M6281 Muscle weakness (generalized): Secondary | ICD-10-CM

## 2017-04-09 NOTE — Patient Instructions (Signed)
Trigger Point Dry Needling  . What is Trigger Point Dry Needling (DN)? o DN is a physical therapy technique used to treat muscle pain and dysfunction. Specifically, DN helps deactivate muscle trigger points (muscle knots).  o A thin filiform needle is used to penetrate the skin and stimulate the underlying trigger point. The goal is for a local twitch response (LTR) to occur and for the trigger point to relax. No medication of any kind is injected during the procedure.   . What Does Trigger Point Dry Needling Feel Like?  o The procedure feels different for each individual patient. Some patients report that they do not actually feel the needle enter the skin and overall the process is not painful. Very mild bleeding may occur. However, many patients feel a deep cramping in the muscle in which the needle was inserted. This is the local twitch response.   Marland Kitchen How Will I feel after the treatment? o Soreness is normal, and the onset of soreness may not occur for a few hours. Typically this soreness does not last longer than two days.  o Bruising is uncommon, however; ice can be used to decrease any possible bruising.  o In rare cases feeling tired or nauseous after the treatment is normal. In addition, your symptoms may get worse before they get better, this period will typically not last longer than 24 hours.   . What Can I do After My Treatment? o Increase your hydration by drinking more water for the next 24 hours. o You may place ice or heat on the areas treated that have become sore, however, do not use heat on inflamed or bruised areas. Heat often brings more relief post needling. o You can continue your regular activities, but vigorous activity is not recommended initially after the treatment for 24 hours. o DN is best combined with other physical therapy such as strengthening, stretching, and other therapies.    TENS UNIT  This is helpful for muscle pain and spasm.   Search and Purchase a  TENS 7000 2nd edition at www.tenspros.com or www.amazon.com  (It should be less than $30)     TENS unit instructions:   Do not shower or bathe with the unit on  Turn the unit off before removing electrodes or batteries  If the electrodes lose stickiness add a drop of water to the electrodes after they are disconnected from the unit and place on plastic sheet. If you continued to have difficulty, call the TENS unit company to purchase more electrodes.  Do not apply lotion on the skin area prior to use. Make sure the skin is clean and dry as this will help prolong the life of the electrodes.  After use, always check skin for unusual red areas, rash or other skin difficulties. If there are any skin problems, does not apply electrodes to the same area.  Never remove the electrodes from the unit by pulling the wires.  Do not use the TENS unit or electrodes other than as directed.  Do not change electrode placement without consulting your therapist or physician.  Keep 2 fingers with between each electrode.   Hamstring Step 2    Left foot relaxed, knee straight, other leg bent, foot flat. Raise straight leg further upward to maximal range. Hold _30__ seconds. Relax leg completely down.  Repeat with other leg. Repeat __2-3_ times.  Perform 2-3 sessions per day.          Knee to Chest (Flexion)  Pull knee toward chest. Feel stretch in lower back or buttock area. Breathing deeply, Hold __30__ seconds. Repeat with other knee. Repeat __3__ times. Do __2-3__ sessions per day.   Piriformis Stretch    Lying on back, pull right knee toward opposite shoulder. Hold _30___ seconds.  Repeat with other leg. Repeat __3__ times. Do __2-3__ sessions per day.

## 2017-04-10 NOTE — Therapy (Signed)
[facility] [address] [place], [place], [address] Phone: [phone]   Fax:  [phone]  Physical Therapy Evaluation  Patient Details  Name: Gloria [name] MRN: [id number] Date of Birth: [birth year] Referring Provider: Dr. Charlann Boxer   Encounter Date: 04/09/2017  PT End of Session - 04/09/17 1648    Visit Number  1    Number of Visits  12    Date for PT Re-Evaluation  05/21/17    Authorization Type  HTA $15 copay    PT Start Time  1602    PT Stop Time  1644    PT Time Calculation (min)  42 min    Activity Tolerance  Patient tolerated treatment well    Behavior During Therapy  [facility] for tasks assessed/performed       Past Medical History:  Diagnosis Date  . Anxiety   . Arthritis   . CKD (chronic kidney disease), stage III ([place])   . Complication of anesthesia 05/2014   had body shaking-observed over night-see anesthesia note-has had tremors post op in past  . Depression   . Hyperlipemia   . Hypertension   . Insomnia     Past Surgical History:  Procedure Laterality Date  . ABDOMINAL HYSTERECTOMY     age 51  . BACK SURGERY    . CARPAL TUNNEL RELEASE  01/20/2012   Procedure: CARPAL TUNNEL RELEASE;  Surgeon: Wynonia Sours, MD;  Location: [place];  Service: Orthopedics;  Laterality: Right;  . CARPAL TUNNEL RELEASE  02/25/2012   Procedure: CARPAL TUNNEL RELEASE;  Surgeon: Wynonia Sours, MD;  Location: [place];  Service: Orthopedics;  Laterality: Left;  RELEASE A-1 PULLEY LRF  . COLONOSCOPY WITH PROPOFOL N/A 10/15/2016   Procedure: COLONOSCOPY WITH PROPOFOL;  Surgeon: Wilford Corner, MD;  Location: [place];  Service: Endoscopy;  Laterality: N/A;  . DISTAL BICEPS TENDON REPAIR Left 06/12/2015   Procedure: REPAIR LEFT BICEPS TENDON AND DECOMPRESSION OF MEDIAN NERVE;  Surgeon: Daryll Brod, MD;  Location: [place];  Service: Orthopedics;  Laterality: Left;  . SMALL INTESTINE  SURGERY    . SMALL INTESTINE SURGERY     has had 6 total bowel obstructions -mostlt adhesions-last 2009  . TONSILLECTOMY     age 11  . TRIGGER FINGER RELEASE  2/12   rt middle  . TRIGGER FINGER RELEASE  2010   lt middle  . ULNAR NERVE TRANSPOSITION Right 05/11/2014   Procedure: DECOMPRESSION ULNAR NERVE RIGHT ELBOW ;  Surgeon: Daryll Brod, MD;  Location: [place];  Service: Orthopedics;  Laterality: Right;  . ULNAR NERVE TRANSPOSITION Left 07/06/2014   Procedure: DECOMPRESSION  LEFT ULNAR NERVE;  Surgeon: Daryll Brod, MD;  Location: [place];  Service: Orthopedics;  Laterality: Left;    There were no vitals filed for this visit.   Subjective Assessment - 04/09/17 1605    Subjective  Pt is a 71 y/o female who presents to OPPT for LBP.  Pt reports symptoms began following trip to [facility] in July 2018 and began to develop pain into RLE a few days after flying home.  Prednisone was mildly helpful.      Limitations  Walking;Other (comment) lying down    How long can you walk comfortably?  45 min    Patient Stated Goals  "I don't have a lot of faith in this." wants to improve pain and hip alignment    Currently in Pain?  Yes    Pain Score  3  up to 8/10    Pain Location  Back    Pain Orientation  Lower;Right    Pain Descriptors / Indicators  Dull;Aching;Throbbing    Pain Type  Chronic pain;Neuropathic pain    Pain Radiating Towards  RLE    Pain Onset  More than a month ago    Pain Frequency  Constant    Aggravating Factors   walking, lying down and rolling over    Pain Relieving Factors  nothing         [facility] PT Assessment - 04/09/17 1609      Assessment   Medical Diagnosis  LBP    Referring Provider  Dr. Charlann Boxer    Onset Date/Surgical Date  -- July 2018    Next MD Visit  04/30/17    Prior Therapy  following orthopedic surgeries (mainly UEs)      Precautions   Precautions  None      Restrictions   Weight Bearing Restrictions  No       Balance Screen   Has the patient fallen in the past 6 months  No    Has the patient had a decrease in activity level because of a fear of falling?   No    Is the patient reluctant to leave their home because of a fear of falling?   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    Leisure  ride bike, walking, go to church, spend time with grandchildren      Cognition   Overall Cognitive Status  Within Functional Limits for tasks assessed      Posture/Postural Control   Posture/Postural Control  Postural limitations    Postural Limitations  Right pelvic obliquity    Posture Comments  scoliosis curve; leg lengths equal      ROM / Strength   AROM / PROM / Strength  AROM;Strength      AROM   Overall AROM Comments  pain Rt>Lt with sidebending    AROM Assessment Site  Lumbar    Lumbar Flexion  65    Lumbar Extension  17    Lumbar - Right Side Bend  18    Lumbar - Left Side Bend  30      Strength   Strength Assessment Site  Hip;Knee;Ankle    Right/Left Hip  Right;Left    Right Hip Flexion  4/5    Right Hip Extension  3/5    Right Hip External Rotation   3+/5    Right Hip Internal Rotation  4/5    Right Hip ABduction  3+/5    Left Hip Flexion  4/5    Left Hip Extension  3+/5    Left Hip External Rotation  4/5    Left Hip Internal Rotation  5/5    Left Hip ABduction  4/5    Right/Left Knee  Right;Left    Right Knee Flexion  4/5  Right Knee Extension  5/5    Left Knee Flexion  5/5    Left Knee Extension  5/5    Right/Left Ankle  Right;Left    Right Ankle Dorsiflexion  5/5    Left Ankle Dorsiflexion  5/5      Flexibility    Soft Tissue Assessment /Muscle Length  yes    Hamstrings  tightness Rt>Lt    Piriformis  tightness Rt>Lt      Palpation   Palpation comment  trigger points noted in glutes and likely piriformis as well             Objective measurements completed on examination: See above findings.      [facility] Adult PT Treatment/Exercise - 04/09/17 1609      Self-Care   Self-Care  Other Self-Care Comments    Other Self-Care Comments   discussed TENS, DN and HEP             PT Education - 04/09/17 1648    Education provided  Yes    Education Details  DN,HEP, TENS    Person(s) Educated  Patient    Methods  Explanation;Handout;Demonstration    Comprehension  Verbalized understanding          PT Long Term Goals - 04/10/17 1041      PT LONG TERM GOAL #1   Title  independent with HEP    Status  New    Target Date  05/22/17      PT LONG TERM GOAL #2   Title  improve bil lower extremity strength to at least 4+/5 for improved funciton and strength    Status  New    Target Date  05/22/17      PT LONG TERM GOAL #3   Title  report pain < 4/10 for improved function and mobility    Status  New    Target Date  05/22/17      PT LONG TERM GOAL #4   Title  n/a      PT LONG TERM GOAL #5   Title  n/a             Plan - 04/10/17 1037    Clinical Impression Statement  Pt is a 71 y/o female who presents to OPPT for Rt sided LBP.  Pt demonstrates decreased ROM and strength, as well as active trigger points noted in Rt glutes.  Pt will benefit from PT to address deficits.    History and Personal Factors relevant to plan of care:  anxiety, depression, HTN, multiple orthopedic surgeries    Clinical Presentation  Evolving    Clinical Presentation due to:  pain into RLE; pt refusing invasive pain management options (surgery, steroid injections)    Clinical Decision Making  Moderate    Rehab Potential  Good    PT Frequency  2x / week    PT Duration  6 weeks    PT  Treatment/Interventions  Electrical Stimulation;ADLs/Self Care Home Management;Cryotherapy;Moist Heat;Ultrasound;Gait training;Functional mobility training;Therapeutic activities;Therapeutic exercise;Balance training;Neuromuscular re-education;Patient/family education;Manual techniques;Taping;Dry needling    PT Next Visit Plan  review HEP, DN/manual/modalities PRN    Consulted and Agree with Plan of Care  Patient       Patient will benefit from skilled therapeutic intervention in order to improve the following deficits and impairments:  Decreased mobility, Increased muscle spasms, Increased fascial restricitons, Pain, Postural dysfunction, Impaired flexibility, Decreased strength, Decreased range of motion  Visit Diagnosis: Acute right-sided low back pain with right-sided sciatica -  Plan: PT plan of care cert/re-cert  Muscle weakness (generalized) - Plan: PT plan of care cert/re-cert  Abnormal posture - Plan: PT plan of care cert/re-cert  [facility] PT PB G-CODES - 05/08/17 1046    Functional Assessment Tool Used   clinical judgement    Functional Limitations  Changing and maintaining body position    Changing and Maintaining Body Position Current Status (Z6109)  At least 20 percent but less than 40 percent impaired, limited or restricted    Changing and Maintaining Body Position Goal Status (U0454)  At least 1 percent but less than 20 percent impaired, limited or restricted        Problem List Patient Active Problem List   Diagnosis Date Noted  . Degenerative lumbar spinal stenosis 03/10/2017  . Personal history of colonic polyps 10/15/2016  . Cervical spondylosis 10/13/2016  . Depression 10/13/2016  . High blood pressure 10/13/2016  . Nervousness 10/13/2016  . Rheumatoid arthritis ([place]) 10/13/2016  . Sleep disorder 10/13/2016  . Complete tear of right rotator cuff 05/16/2016  . Status post right rotator cuff repair 03/04/2016  . Lesion of left median nerve at forearm 07/16/2015  .  Nontraumatic rupture of left bicep tendon 05/09/2015  . Trigger index finger of right hand 05/09/2015  . Other synovitis and tenosynovitis, left forearm 04/25/2015  . Benign neoplasm of soft tissues of left upper extremity 04/09/2015  . Neoplasm of unspecified behavior of bone, soft tissue, and skin 04/09/2015  . Right lateral epicondylitis 04/09/2015  . Ulnar nerve compression 05/11/2014  . Pseudophakia 06/23/2012  . After-cataract 05/30/2011  . Bilateral dry eyes 05/30/2011      Laureen Abrahams, PT, DPT May 08, 2017 10:48 AM    [facility] [address] [place], [place], [address] Phone: [phone]   Fax:  [phone]  Name: Gloria [name] MRN: [id number] Date of Birth: Jan 19, 1946

## 2017-04-21 ENCOUNTER — Ambulatory Visit: Payer: PPO | Admitting: Physical Therapy

## 2017-04-21 DIAGNOSIS — M6281 Muscle weakness (generalized): Secondary | ICD-10-CM | POA: Diagnosis not present

## 2017-04-21 DIAGNOSIS — R293 Abnormal posture: Secondary | ICD-10-CM

## 2017-04-21 DIAGNOSIS — M5441 Lumbago with sciatica, right side: Secondary | ICD-10-CM | POA: Diagnosis not present

## 2017-04-21 NOTE — Patient Instructions (Signed)
  TARGET BALL:  Turn towards the main section of the store and head towards the cards.  Turn at the cards and head towards the back of the store.  Just past the home goods before you get to the toys you will see an end cap with "party favor" type toys.  The ball is located in one of these bins.  Look for the softball sized ball that lights up.  It should be around $5.

## 2017-04-21 NOTE — Therapy (Signed)
[facility] [address] [place], [place], [address] Phone: [phone]   Fax:  [phone]  Physical Therapy Treatment  Patient Details  Name: Gloria [name] MRN: [id number] Date of Birth: 1945/05/19 Referring Provider: Dr. Charlann Boxer   Encounter Date: 04/21/2017  PT End of Session - 04/21/17 1149    Visit Number  2    Number of Visits  12    Date for PT Re-Evaluation  05/21/17    Authorization Type  HTA $15 copay    PT Start Time  1100    PT Stop Time  1140    PT Time Calculation (min)  40 min    Activity Tolerance  Patient tolerated treatment well    Behavior During Therapy  [facility] for tasks assessed/performed       Past Medical History:  Diagnosis Date  . Anxiety   . Arthritis   . CKD (chronic kidney disease), stage III ([employer])   . Complication of anesthesia 05/2014   had body shaking-observed over night-see anesthesia note-has had tremors post op in past  . Depression   . Hyperlipemia   . Hypertension   . Insomnia     Past Surgical History:  Procedure Laterality Date  . ABDOMINAL HYSTERECTOMY     age 58  . BACK SURGERY    . CARPAL TUNNEL RELEASE  01/20/2012   Procedure: CARPAL TUNNEL RELEASE;  Surgeon: Wynonia Sours, MD;  Location: [place];  Service: Orthopedics;  Laterality: Right;  . CARPAL TUNNEL RELEASE  02/25/2012   Procedure: CARPAL TUNNEL RELEASE;  Surgeon: Wynonia Sours, MD;  Location: [place];  Service: Orthopedics;  Laterality: Left;  RELEASE A-1 PULLEY LRF  . COLONOSCOPY WITH PROPOFOL N/A 10/15/2016   Procedure: COLONOSCOPY WITH PROPOFOL;  Surgeon: Wilford Corner, MD;  Location: [place];  Service: Endoscopy;  Laterality: N/A;  . DISTAL BICEPS TENDON REPAIR Left 06/12/2015   Procedure: REPAIR LEFT BICEPS TENDON AND DECOMPRESSION OF MEDIAN NERVE;  Surgeon: Daryll Brod, MD;  Location: [place];  Service: Orthopedics;  Laterality: Left;  . SMALL INTESTINE  SURGERY    . SMALL INTESTINE SURGERY     has had 6 total bowel obstructions -mostlt adhesions-last 2009  . TONSILLECTOMY     age 71  . TRIGGER FINGER RELEASE  2/12   rt middle  . TRIGGER FINGER RELEASE  2010   lt middle  . ULNAR NERVE TRANSPOSITION Right 05/11/2014   Procedure: DECOMPRESSION ULNAR NERVE RIGHT ELBOW ;  Surgeon: Daryll Brod, MD;  Location: [place];  Service: Orthopedics;  Laterality: Right;  . ULNAR NERVE TRANSPOSITION Left 07/06/2014   Procedure: DECOMPRESSION  LEFT ULNAR NERVE;  Surgeon: Daryll Brod, MD;  Location: [place];  Service: Orthopedics;  Laterality: Left;    There were no vitals filed for this visit.  Subjective Assessment - 04/21/17 1101    Subjective  doing exercises 3x/day and things were going okay, but then pain returned.  Is doing them at least once a day now.    Patient Stated Goals  "I don't have a lot of faith in this." wants to improve pain and hip alignment    Currently in Pain?  Yes    Pain Score  4     Pain Location  Back    Pain Orientation  Right;Lower    Pain Descriptors / Indicators  Throbbing;Aching;Dull with occasional sharp pains    Pain Type  Chronic  pain;Neuropathic pain    Pain Radiating Towards  RLE    Pain Onset  More than a month ago    Pain Frequency  Constant    Aggravating Factors   walking, lying down, rolling over    Pain Relieving Factors  nothing                      [facility] Adult PT Treatment/Exercise - 04/21/17 1146      Self-Care   Self-Care  Other Self-Care Comments    Other Self-Care Comments   use of ball for self STM and TPR; decr frequency of exercises to 1-2x/day and that focus should be on gentle stretch and not overstretching      Manual Therapy   Manual Therapy  Soft tissue mobilization;Myofascial release    Manual therapy comments  pt prone    Soft tissue mobilization  Rt glutes    Myofascial Release  Rt glute med/min       Trigger Point Dry Needling -  04/21/17 1148    Consent Given?  Yes    Education Handout Provided  Yes    Muscles Treated Lower Body  Gluteus minimus and Rt glute medius    Gluteus Minimus Response  Twitch response elicited;Palpable increased muscle length           PT Education - 04/21/17 1149    Education provided  Yes    Education Details  see self care    Person(s) Educated  Patient    Methods  Explanation;Handout    Comprehension  Verbalized understanding;Returned demonstration          PT Long Term Goals - 04/10/17 1041      PT LONG TERM GOAL #1   Title  independent with HEP    Status  New    Target Date  05/22/17      PT LONG TERM GOAL #2   Title  improve bil lower extremity strength to at least 4+/5 for improved funciton and strength    Status  New    Target Date  05/22/17      PT LONG TERM GOAL #3   Title  report pain < 4/10 for improved function and mobility    Status  New    Target Date  05/22/17      PT LONG TERM GOAL #4   Title  n/a      PT LONG TERM GOAL #5   Title  n/a            Plan - 04/21/17 1149    Clinical Impression Statement  Pt tolerated DN well today with twitch responses noted, especially in glute min, and decreased tightness following.  Verbally reviewed HEP and encouraged gentle stretching and to avoid overstretching as pt reports she feels she may have been overstretching.  No goals met as only 2nd visit, but will continue to benefit from PT to address deficits.    PT Treatment/Interventions  Electrical Stimulation;ADLs/Self Care Home Management;Cryotherapy;Moist Heat;Ultrasound;Gait training;Functional mobility training;Therapeutic activities;Therapeutic exercise;Balance training;Neuromuscular re-education;Patient/family education;Manual techniques;Taping;Dry needling    PT Next Visit Plan  review HEP, assess response to DN, manual/modalities PRN, add core/hip strengthening    Consulted and Agree with Plan of Care  Patient       Patient will benefit from  skilled therapeutic intervention in order to improve the following deficits and impairments:  Decreased mobility, Increased muscle spasms, Increased fascial restricitons, Pain, Postural dysfunction, Impaired flexibility, Decreased strength, Decreased range of  motion  Visit Diagnosis: Acute right-sided low back pain with right-sided sciatica  Muscle weakness (generalized)  Abnormal posture     Problem List Patient Active Problem List   Diagnosis Date Noted  . Degenerative lumbar spinal stenosis 03/10/2017  . Personal history of colonic polyps 10/15/2016  . Cervical spondylosis 10/13/2016  . Depression 10/13/2016  . High blood pressure 10/13/2016  . Nervousness 10/13/2016  . Rheumatoid arthritis ([place]) 10/13/2016  . Sleep disorder 10/13/2016  . Complete tear of right rotator cuff 05/16/2016  . Status post right rotator cuff repair 03/04/2016  . Lesion of left median nerve at forearm 07/16/2015  . Nontraumatic rupture of left bicep tendon 05/09/2015  . Trigger index finger of right hand 05/09/2015  . Other synovitis and tenosynovitis, left forearm 04/25/2015  . Benign neoplasm of soft tissues of left upper extremity 04/09/2015  . Neoplasm of unspecified behavior of bone, soft tissue, and skin 04/09/2015  . Right lateral epicondylitis 04/09/2015  . Ulnar nerve compression 05/11/2014  . Pseudophakia 06/23/2012  . After-cataract 05/30/2011  . Bilateral dry eyes 05/30/2011      Laureen Abrahams, PT, DPT 04/21/17 11:52 AM    [facility] [place], [place], [address] Phone: [phone]   Fax:  [phone]  Name: Gloria [name] MRN: [id number] Date of Birth: 07-Dec-1945

## 2017-04-23 ENCOUNTER — Ambulatory Visit: Payer: PPO | Admitting: Physical Therapy

## 2017-04-23 ENCOUNTER — Encounter: Payer: Self-pay | Admitting: Physical Therapy

## 2017-04-23 DIAGNOSIS — M6281 Muscle weakness (generalized): Secondary | ICD-10-CM

## 2017-04-23 DIAGNOSIS — M5441 Lumbago with sciatica, right side: Secondary | ICD-10-CM | POA: Diagnosis not present

## 2017-04-23 DIAGNOSIS — R293 Abnormal posture: Secondary | ICD-10-CM

## 2017-04-23 NOTE — Patient Instructions (Signed)
PELVIC STABILIZATION: Pelvic Tilt (Lying)    Exhaling, pull belly toward spine, tilting pelvis forward. Hold for 5 seconds. Inhaling, release. Repeat __10_ times. Do _2-3__ times per day.   Bridging    Slowly raise buttocks from floor, keeping stomach tight.  Hold for 5 seconds.  Repeat __10__ times per set. Do __1__ sets per session. Do __2-3__ sessions per day.

## 2017-04-23 NOTE — Therapy (Signed)
[facility] [address] [place], [place], [address] Phone: [phone]   Fax:  [phone]  Physical Therapy Treatment  Patient Details  Name: Gloria [name] MRN: [id number] Date of Birth: 07-20-1945 Referring Provider: Dr. Charlann Boxer   Encounter Date: 04/23/2017  PT End of Session - 04/23/17 1100    Visit Number  3    Number of Visits  12    Date for PT Re-Evaluation  05/21/17    Authorization Type  HTA $15 copay    PT Start Time  1012    PT Stop Time  1059    PT Time Calculation (min)  47 min    Activity Tolerance  Patient tolerated treatment well    Behavior During Therapy  [facility] for tasks assessed/performed       Past Medical History:  Diagnosis Date  . Anxiety   . Arthritis   . CKD (chronic kidney disease), stage III ([place])   . Complication of anesthesia 05/2014   had body shaking-observed over night-see anesthesia note-has had tremors post op in past  . Depression   . Hyperlipemia   . Hypertension   . Insomnia     Past Surgical History:  Procedure Laterality Date  . ABDOMINAL HYSTERECTOMY     age 71  . BACK SURGERY    . CARPAL TUNNEL RELEASE  01/20/2012   Procedure: CARPAL TUNNEL RELEASE;  Surgeon: Wynonia Sours, MD;  Location: [place];  Service: Orthopedics;  Laterality: Right;  . CARPAL TUNNEL RELEASE  02/25/2012   Procedure: CARPAL TUNNEL RELEASE;  Surgeon: Wynonia Sours, MD;  Location: [place];  Service: Orthopedics;  Laterality: Left;  RELEASE A-1 PULLEY LRF  . COLONOSCOPY WITH PROPOFOL N/A 10/15/2016   Procedure: COLONOSCOPY WITH PROPOFOL;  Surgeon: Wilford Corner, MD;  Location: [place];  Service: Endoscopy;  Laterality: N/A;  . DISTAL BICEPS TENDON REPAIR Left 06/12/2015   Procedure: REPAIR LEFT BICEPS TENDON AND DECOMPRESSION OF MEDIAN NERVE;  Surgeon: Daryll Brod, MD;  Location: [place];  Service: Orthopedics;  Laterality: Left;  . SMALL INTESTINE  SURGERY    . SMALL INTESTINE SURGERY     has had 6 total bowel obstructions -mostlt adhesions-last 2009  . TONSILLECTOMY     age 71  . TRIGGER FINGER RELEASE  2/12   rt middle  . TRIGGER FINGER RELEASE  2010   lt middle  . ULNAR NERVE TRANSPOSITION Right 05/11/2014   Procedure: DECOMPRESSION ULNAR NERVE RIGHT ELBOW ;  Surgeon: Daryll Brod, MD;  Location: [place];  Service: Orthopedics;  Laterality: Right;  . ULNAR NERVE TRANSPOSITION Left 07/06/2014   Procedure: DECOMPRESSION  LEFT ULNAR NERVE;  Surgeon: Daryll Brod, MD;  Location: [place];  Service: Orthopedics;  Laterality: Left;    There were no vitals filed for this visit.  Subjective Assessment - 04/23/17 1015    Subjective  feels like she's walking with less pain.  still having pain in the back.  still having pain with rolling over    Patient Stated Goals  "I don't have a lot of faith in this." wants to improve pain and hip alignment    Pain Score  0-No pain                      [facility] Adult PT Treatment/Exercise - 04/23/17 1019      Exercises   Exercises  Lumbar  Lumbar Exercises: Standing   Other Standing Lumbar Exercises  hip extension x 10 reps bil      Lumbar Exercises: Seated   Sit to Stand  10 reps    Sit to Stand Limitations  with green theraband      Lumbar Exercises: Supine   Ab Set  10 reps;5 seconds    Bridge  10 reps;5 seconds      Manual Therapy   Manual Therapy  Joint mobilization    Joint Mobilization  Rt hip: long axis distraction and P/A mobs grades 2-3             PT Education - 04/23/17 1100    Education provided  Yes    Education Details  HEP    Person(s) Educated  Patient    Methods  Explanation;Handout;Demonstration    Comprehension  Verbalized understanding;Returned demonstration;Need further instruction          PT Long Term Goals - 04/10/17 1041      PT LONG TERM GOAL #1   Title  independent with HEP    Status  New     Target Date  05/22/17      PT LONG TERM GOAL #2   Title  improve bil lower extremity strength to at least 4+/5 for improved funciton and strength    Status  New    Target Date  05/22/17      PT LONG TERM GOAL #3   Title  report pain < 4/10 for improved function and mobility    Status  New    Target Date  05/22/17      PT LONG TERM GOAL #4   Title  n/a      PT LONG TERM GOAL #5   Title  n/a            Plan - 04/23/17 1100    Clinical Impression Statement  Pt reported some benefit from DN in LE, but still having pain in the back, and will plan to DN paraspinals and multifidi next session.  Progressing well with PT.    PT Treatment/Interventions  Electrical Stimulation;ADLs/Self Care Home Management;Cryotherapy;Moist Heat;Ultrasound;Gait training;Functional mobility training;Therapeutic activities;Therapeutic exercise;Balance training;Neuromuscular re-education;Patient/family education;Manual techniques;Taping;Dry needling    PT Next Visit Plan  review HEP, manual/modalities PRN, add core/hip strengthening    Consulted and Agree with Plan of Care  Patient       Patient will benefit from skilled therapeutic intervention in order to improve the following deficits and impairments:  Decreased mobility, Increased muscle spasms, Increased fascial restricitons, Pain, Postural dysfunction, Impaired flexibility, Decreased strength, Decreased range of motion  Visit Diagnosis: Acute right-sided low back pain with right-sided sciatica  Muscle weakness (generalized)  Abnormal posture     Problem List Patient Active Problem List   Diagnosis Date Noted  . Degenerative lumbar spinal stenosis 03/10/2017  . Personal history of colonic polyps 10/15/2016  . Cervical spondylosis 10/13/2016  . Depression 10/13/2016  . High blood pressure 10/13/2016  . Nervousness 10/13/2016  . Rheumatoid arthritis ([place]) 10/13/2016  . Sleep disorder 10/13/2016  . Complete tear of right rotator cuff  05/16/2016  . Status post right rotator cuff repair 03/04/2016  . Lesion of left median nerve at forearm 07/16/2015  . Nontraumatic rupture of left bicep tendon 05/09/2015  . Trigger index finger of right hand 05/09/2015  . Other synovitis and tenosynovitis, left forearm 04/25/2015  . Benign neoplasm of soft tissues of left upper extremity 04/09/2015  . Neoplasm of unspecified behavior  of bone, soft tissue, and skin 04/09/2015  . Right lateral epicondylitis 04/09/2015  . Ulnar nerve compression 05/11/2014  . Pseudophakia 06/23/2012  . After-cataract 05/30/2011  . Bilateral dry eyes 05/30/2011      Laureen Abrahams, PT, DPT 04/23/17 11:02 AM    [facility] [place], [place], [address] Phone: [phone]   Fax:  [phone]  Name: Gloria [name] MRN: [id number] Date of Birth: 05/13/1945

## 2017-04-29 NOTE — Progress Notes (Signed)
[name] [place], [place] [address] Phone: [phone] Subjective:     CC: Low back pain follow-up  VQQ:[id number]  Gloria [name] is a 71 y.o. female coming in with complaint of low back pain.  Found to have degenerative scoliosis lumbar spine with radicular symptoms.  Started on gabapentin by.  Patient went on to formal physical therapy.  Has been there 3 times. Patient states that she has been some improvement but not as other type of treatment options.  Sometimes feels like she is just malalignment.     Past Medical History:  Diagnosis Date  . Anxiety   . Arthritis   . CKD (chronic kidney disease), stage III ([place])   . Complication of anesthesia 05/2014   had body shaking-observed over night-see anesthesia note-has had tremors post op in past  . Depression   . Hyperlipemia   . Hypertension   . Insomnia    Past Surgical History:  Procedure Laterality Date  . ABDOMINAL HYSTERECTOMY     age 23  . BACK SURGERY    . CARPAL TUNNEL RELEASE  01/20/2012   Procedure: CARPAL TUNNEL RELEASE;  Surgeon: Wynonia Sours, MD;  Location: [place];  Service: Orthopedics;  Laterality: Right;  . CARPAL TUNNEL RELEASE  02/25/2012   Procedure: CARPAL TUNNEL RELEASE;  Surgeon: Wynonia Sours, MD;  Location: [facility];  Service: Orthopedics;  Laterality: Left;  RELEASE A-1 PULLEY LRF  . COLONOSCOPY WITH PROPOFOL N/A 10/15/2016   Procedure: COLONOSCOPY WITH PROPOFOL;  Surgeon: Wilford Corner, MD;  Location: [place];  Service: Endoscopy;  Laterality: N/A;  . DISTAL BICEPS TENDON REPAIR Left 06/12/2015   Procedure: REPAIR LEFT BICEPS TENDON AND DECOMPRESSION OF MEDIAN NERVE;  Surgeon: Daryll Brod, MD;  Location: [place] [facility];  Service: Orthopedics;  Laterality: Left;  . SMALL INTESTINE SURGERY    . SMALL INTESTINE SURGERY     has had 6 total bowel obstructions -mostlt adhesions-last 2009  . TONSILLECTOMY     age 50  . TRIGGER FINGER RELEASE  2/12   rt middle  . TRIGGER FINGER RELEASE  2010   lt middle  . ULNAR NERVE TRANSPOSITION Right 05/11/2014   Procedure: DECOMPRESSION ULNAR NERVE RIGHT ELBOW ;  Surgeon: Daryll Brod, MD;  Location: [place];  Service: Orthopedics;  Laterality: Right;  . ULNAR NERVE TRANSPOSITION Left 07/06/2014   Procedure: DECOMPRESSION  LEFT ULNAR NERVE;  Surgeon: Daryll Brod, MD;  Location: [place];  Service: Orthopedics;  Laterality: Left;   Social History   Socioeconomic History  . Marital status: Married    Spouse name: None  . Number of children: None  . Years of education: None  . Highest education level: None  Social Needs  . Financial resource strain: None  . Food insecurity - worry: None  . Food insecurity - inability: None  . Transportation needs - medical: None  . Transportation needs - non-medical: None  Occupational History  . None  Tobacco Use  . Smoking status: Never Smoker  . Smokeless tobacco: Never Used  Substance and Sexual Activity  . Alcohol use: No  . Drug use: No  . Sexual activity: None  Other Topics Concern  . None  Social History Narrative  . None   Allergies  Allergen Reactions  . Versed [Midazolam] Other (See Comments)  . Diprivan [Propofol] Other (See Comments)    Uncontrolled shaking  . Fentanyl   .  Sevoflurane   . Zofran [Ondansetron Hcl]    No family history on file.   Past medical history, social, surgical and family history all reviewed in electronic medical record.  No pertanent information unless stated regarding to the chief complaint.   Review of Systems:Review of systems updated and as accurate as of 04/30/17  No headache, visual changes, nausea, vomiting, diarrhea, constipation, dizziness, abdominal pain, skin rash, fevers, chills, night sweats, weight loss, swollen lymph nodes, body aches, joint swelling,  chest pain, shortness of breath, mood changes.  Positive muscle  aches  Objective  Blood pressure 120/70, pulse 73, height 5' 6.25" (1.683 m), weight 146 lb (66.2 kg), SpO2 97 %. Systems examined below as of 04/30/17   General: No apparent distress alert and oriented x3 mood and affect normal, dressed appropriately.  HEENT: Pupils equal, extraocular movements intact  Respiratory: Patient's speak in full sentences and does not appear short of breath  Cardiovascular: No lower extremity edema, non tender, no erythema  Skin: Warm dry intact with no signs of infection or rash on extremities or on axial skeleton.  Abdomen: Soft nontender  Neuro: Cranial nerves II through XII are intact, neurovascularly intact in all extremities with 2+ DTRs and 2+ pulses.  Lymph: No lymphadenopathy of posterior or anterior cervical chain or axillae bilaterally.  Gait normal with good balance and coordination.  MSK:  Non tender with full range of motion and good stability and symmetric strength and tone of shoulders, elbows, wrist, hip, knee and ankles bilaterally.  Arthritic changes of multiple joints Degenerative scoliosis noted.  This is of the lumbar spine.  Mild increase in tightness of the paraspinal musculature right greater than left.  Positive Faber test on the right.  Negative straight leg test bilaterally.  Mild crepitus with range of motion.  Lacking last 5-10 degrees in all planes.  Osteopathic findings T5 extended rotated and side bent right inhaled rib T7 extended rotated and side bent left L1 flexed rotated and side bent right Sacrum right on right     Impression and Recommendations:     This case required medical decision making of moderate complexity.      Note: This dictation was prepared with Dragon dictation along with smaller phrase technology. Any transcriptional errors that result from this process are unintentional.

## 2017-04-30 ENCOUNTER — Encounter: Payer: Self-pay | Admitting: Family Medicine

## 2017-04-30 ENCOUNTER — Ambulatory Visit: Payer: PPO | Admitting: Family Medicine

## 2017-04-30 ENCOUNTER — Encounter: Payer: Self-pay | Admitting: Physical Therapy

## 2017-04-30 ENCOUNTER — Ambulatory Visit: Payer: PPO | Admitting: Physical Therapy

## 2017-04-30 DIAGNOSIS — M6281 Muscle weakness (generalized): Secondary | ICD-10-CM | POA: Diagnosis not present

## 2017-04-30 DIAGNOSIS — M48061 Spinal stenosis, lumbar region without neurogenic claudication: Secondary | ICD-10-CM | POA: Diagnosis not present

## 2017-04-30 DIAGNOSIS — M5441 Lumbago with sciatica, right side: Secondary | ICD-10-CM | POA: Diagnosis not present

## 2017-04-30 DIAGNOSIS — R293 Abnormal posture: Secondary | ICD-10-CM | POA: Diagnosis not present

## 2017-04-30 DIAGNOSIS — M999 Biomechanical lesion, unspecified: Secondary | ICD-10-CM | POA: Insufficient documentation

## 2017-04-30 NOTE — Assessment & Plan Note (Signed)
Decision today to treat with OMT was based on Physical Exam  After verbal consent patient was treated with HVLA, ME, FPR techniques in  thoracic, rib, lumbar and sacral areas  Patient tolerated the procedure well with improvement in symptoms  Patient given exercises, stretches and lifestyle modifications  See medications in patient instructions if given  Patient will follow up in 5 weeks

## 2017-04-30 NOTE — Assessment & Plan Note (Signed)
Patient has known degenerative lumbar stenosis.  Possible injections.  Patient has done fairly well with conservative therapy and we started osteopathic manipulation well.  We will see how patient responds.  We discussed icing regimen, home exercise, which activities of doing which wants to avoid.  Past medical history significant for rheumatoid arthritis.  Follow-up in 4 weeks

## 2017-04-30 NOTE — Patient Instructions (Signed)
You are making progress which is great  Started manipulation and I hope it helps Ice is your friend.  Continue the vitamins See me again in 4 weeks Happy New Year!

## 2017-04-30 NOTE — Therapy (Addendum)
[facility] [address] [place], [place], [address] Phone: [phone]   Fax:  [phone]  Physical Therapy Treatment/Discharge  Patient Details  Name: Gloria [name] MRN: [id number] Date of Birth: 17-Jun-1945 Referring Provider: Dr. Charlann Boxer   Encounter Date: 04/30/2017  PT End of Session - 04/30/17 1337    Visit Number  4    Number of Visits  12    Date for PT Re-Evaluation  05/21/17    Authorization Type  HTA $15 copay    PT Start Time  1255    PT Stop Time  1335    PT Time Calculation (min)  40 min    Activity Tolerance  Patient tolerated treatment well    Behavior During Therapy  [facility] for tasks assessed/performed       Past Medical History:  Diagnosis Date  . Anxiety   . Arthritis   . CKD (chronic kidney disease), stage III ([place])   . Complication of anesthesia 05/2014   had body shaking-observed over night-see anesthesia note-has had tremors post op in past  . Depression   . Hyperlipemia   . Hypertension   . Insomnia     Past Surgical History:  Procedure Laterality Date  . ABDOMINAL HYSTERECTOMY     age 35  . BACK SURGERY    . CARPAL TUNNEL RELEASE  01/20/2012   Procedure: CARPAL TUNNEL RELEASE;  Surgeon: Wynonia Sours, MD;  Location: [place];  Service: Orthopedics;  Laterality: Right;  . CARPAL TUNNEL RELEASE  02/25/2012   Procedure: CARPAL TUNNEL RELEASE;  Surgeon: Wynonia Sours, MD;  Location: [place];  Service: Orthopedics;  Laterality: Left;  RELEASE A-1 PULLEY LRF  . COLONOSCOPY WITH PROPOFOL N/A 10/15/2016   Procedure: COLONOSCOPY WITH PROPOFOL;  Surgeon: Wilford Corner, MD;  Location: [facility];  Service: Endoscopy;  Laterality: N/A;  . DISTAL BICEPS TENDON REPAIR Left 06/12/2015   Procedure: REPAIR LEFT BICEPS TENDON AND DECOMPRESSION OF MEDIAN NERVE;  Surgeon: Daryll Brod, MD;  Location: [place] [facility];  Service: Orthopedics;  Laterality: Left;  . SMALL  INTESTINE SURGERY    . SMALL INTESTINE SURGERY     has had 6 total bowel obstructions -mostlt adhesions-last 2009  . TONSILLECTOMY     age 25  . TRIGGER FINGER RELEASE  2/12   rt middle  . TRIGGER FINGER RELEASE  2010   lt middle  . ULNAR NERVE TRANSPOSITION Right 05/11/2014   Procedure: DECOMPRESSION ULNAR NERVE RIGHT ELBOW ;  Surgeon: Daryll Brod, MD;  Location: [place];  Service: Orthopedics;  Laterality: Right;  . ULNAR NERVE TRANSPOSITION Left 07/06/2014   Procedure: DECOMPRESSION  LEFT ULNAR NERVE;  Surgeon: Daryll Brod, MD;  Location: [facility];  Service: Orthopedics;  Laterality: Left;    There were no vitals filed for this visit.  Subjective Assessment - 04/30/17 1249    Subjective  wants to do one DN session and make today last scheduled PT appt.  Reports pain 3-4/10 with walking around; states she feels much better.      Patient Stated Goals  "I don't have a lot of faith in this." wants to improve pain and hip alignment    Currently in Pain?  No/denies         [facility] PT Assessment - 04/30/17 0001      Strength   Right Hip Flexion  5/5    Right Hip Extension  4/5  Right Hip External Rotation   4/5    Right Hip Internal Rotation  5/5    Right Hip ABduction  4/5    Left Hip Flexion  4/5    Left Hip Extension  4/5    Left Hip External Rotation  4/5    Left Hip Internal Rotation  5/5    Left Hip ABduction  4/5    Right Knee Flexion  5/5    Right Knee Extension  5/5    Left Knee Flexion  5/5    Left Knee Extension  5/5    Right Ankle Dorsiflexion  5/5    Left Ankle Dorsiflexion  5/5                  [facility] Adult PT Treatment/Exercise - 05-17-17 1257      Self-Care   Other Self-Care Comments   verbally reviewed HEP; pt report she's independent and compliant      Lumbar Exercises: Aerobic   Stationary Bike  L1-2 x 5 min       Trigger Point Dry Needling - 17-May-2017 1336    Consent Given?  Yes    Muscles Treated Upper  Body  Longissimus and multifidi; lumbar    Longissimus Response  Palpable increased muscle length                PT Long Term Goals - 17-May-2017 1337      PT LONG TERM GOAL #1   Title  independent with HEP    Status  On-going      PT LONG TERM GOAL #2   Title  improve bil lower extremity strength to at least 4+/5 for improved funciton and strength    Baseline  2017/05/17: 4/5-5/5 all LE muscle groups    Status  On-going      PT LONG TERM GOAL #3   Title  report pain < 4/10 for improved function and mobility    Status  Achieved            Plan - May 17, 2017 1338    Clinical Impression Statement  Pt requested to d/c PT today and will continue with HEP.  Tolerated DN well today.  Pt met pain goal and other 2 goals ongoing.  Recommended holding PT v/s d/c and pt agreeable.     PT Treatment/Interventions  Electrical Stimulation;ADLs/Self Care Home Management;Cryotherapy;Moist Heat;Ultrasound;Gait training;Functional mobility training;Therapeutic activities;Therapeutic exercise;Balance training;Neuromuscular re-education;Patient/family education;Manual techniques;Taping;Dry needling    PT Next Visit Plan  hold PT x 30 days; manual/modalities PRN, add core/hip strengthening    Consulted and Agree with Plan of Care  Patient       Patient will benefit from skilled therapeutic intervention in order to improve the following deficits and impairments:  Decreased mobility, Increased muscle spasms, Increased fascial restricitons, Pain, Postural dysfunction, Impaired flexibility, Decreased strength, Decreased range of motion  Visit Diagnosis: Acute right-sided low back pain with right-sided sciatica  Muscle weakness (generalized)  Abnormal posture   [facility] PT PB G-CODES - 05/17/2017 1340    Functional Assessment Tool Used   clinical judgement    Functional Limitations  Changing and maintaining body position    Changing and Maintaining Body Position Goal Status (Z6109)  At least 1  percent but less than 20 percent impaired, limited or restricted    Changing and Maintaining Body Position Discharge Status (U0454)  At least 1 percent but less than 20 percent impaired, limited or restricted       Problem List Patient  Active Problem List   Diagnosis Date Noted  . Nonallopathic lesion of rib cage 04/30/2017  . Nonallopathic lesion of thoracic region 04/30/2017  . Nonallopathic lesion of lumbosacral region 04/30/2017  . Nonallopathic lesion of sacral region 04/30/2017  . Degenerative lumbar spinal stenosis 03/10/2017  . Personal history of colonic polyps 10/15/2016  . Cervical spondylosis 10/13/2016  . Depression 10/13/2016  . High blood pressure 10/13/2016  . Nervousness 10/13/2016  . Rheumatoid arthritis ([place]) 10/13/2016  . Sleep disorder 10/13/2016  . Complete tear of right rotator cuff 05/16/2016  . Status post right rotator cuff repair 03/04/2016  . Lesion of left median nerve at forearm 07/16/2015  . Nontraumatic rupture of left bicep tendon 05/09/2015  . Trigger index finger of right hand 05/09/2015  . Other synovitis and tenosynovitis, left forearm 04/25/2015  . Benign neoplasm of soft tissues of left upper extremity 04/09/2015  . Neoplasm of unspecified behavior of bone, soft tissue, and skin 04/09/2015  . Right lateral epicondylitis 04/09/2015  . Ulnar nerve compression 05/11/2014  . Pseudophakia 06/23/2012  . After-cataract 05/30/2011  . Bilateral dry eyes 05/30/2011      Laureen Abrahams, PT, DPT 04/30/17 1:40 PM    [facility] [place], [place], [address] Phone: [phone]   Fax:  [phone]  Name: Gloria [name] MRN: [id number] Date of Birth: 25-Feb-1946      PHYSICAL THERAPY DISCHARGE SUMMARY  Visits from Start of Care: 4  Current functional level related to goals / functional outcomes: See above   Remaining deficits: See above   Education / Equipment: HEP   Plan: Patient agrees to discharge.  Patient goals were partially met. Patient is being discharged due to being pleased with the current functional level.  ?????     Laureen Abrahams, PT, DPT 06/04/17 12:23 PM   [facility] [address] [place], [place], [address] Phone: [phone]  Fax: [phone]

## 2017-05-14 DIAGNOSIS — M654 Radial styloid tenosynovitis [de Quervain]: Secondary | ICD-10-CM | POA: Diagnosis not present

## 2017-05-14 DIAGNOSIS — M2550 Pain in unspecified joint: Secondary | ICD-10-CM | POA: Diagnosis not present

## 2017-05-14 DIAGNOSIS — M5431 Sciatica, right side: Secondary | ICD-10-CM | POA: Diagnosis not present

## 2017-05-14 DIAGNOSIS — Z6823 Body mass index (BMI) 23.0-23.9, adult: Secondary | ICD-10-CM | POA: Diagnosis not present

## 2017-05-14 DIAGNOSIS — M0609 Rheumatoid arthritis without rheumatoid factor, multiple sites: Secondary | ICD-10-CM | POA: Diagnosis not present

## 2017-05-14 DIAGNOSIS — M150 Primary generalized (osteo)arthritis: Secondary | ICD-10-CM | POA: Diagnosis not present

## 2017-05-14 DIAGNOSIS — Z79899 Other long term (current) drug therapy: Secondary | ICD-10-CM | POA: Diagnosis not present

## 2017-05-18 ENCOUNTER — Other Ambulatory Visit: Payer: Self-pay | Admitting: Family Medicine

## 2017-05-27 NOTE — Progress Notes (Signed)
[name] [address] [place], [place] [address] Phone: [phone] Subjective:     CC: Back pain follow-up  [id number]  Gloria [name] is a 72 y.o. female coming in with complaint of back pain.  Does have degenerative scoliosis lumbar spine with radicular symptoms.  Started gabapentin and vitamin D.  Started also formal physical therapy.  Patient was making some improvement last month and we attempted osteopathic manipulation.  Patient was to continue with conservative therapy including the home exercises.  Patient states that she did some prednisone recently which helped. She said that she continues to have pain though and most often when she is lying supine.      Past Medical History:  Diagnosis Date  . Anxiety   . Arthritis   . CKD (chronic kidney disease), stage III ([place])   . Complication of anesthesia 05/2014   had body shaking-observed over night-see anesthesia note-has had tremors post op in past  . Depression   . Hyperlipemia   . Hypertension   . Insomnia    Past Surgical History:  Procedure Laterality Date  . ABDOMINAL HYSTERECTOMY     age 52  . BACK SURGERY    . CARPAL TUNNEL RELEASE  01/20/2012   Procedure: CARPAL TUNNEL RELEASE;  Surgeon: Wynonia Sours, MD;  Location: [facility];  Service: Orthopedics;  Laterality: Right;  . CARPAL TUNNEL RELEASE  02/25/2012   Procedure: CARPAL TUNNEL RELEASE;  Surgeon: Wynonia Sours, MD;  Location: [facility];  Service: Orthopedics;  Laterality: Left;  RELEASE A-1 PULLEY LRF  . COLONOSCOPY WITH PROPOFOL N/A 10/15/2016   Procedure: COLONOSCOPY WITH PROPOFOL;  Surgeon: Wilford Corner, MD;  Location: [facility];  Service: Endoscopy;  Laterality: N/A;  . DISTAL BICEPS TENDON REPAIR Left 06/12/2015   Procedure: REPAIR LEFT BICEPS TENDON AND DECOMPRESSION OF MEDIAN NERVE;  Surgeon: Daryll Brod, MD;  Location: [facility];  Service: Orthopedics;  Laterality:  Left;  . SMALL INTESTINE SURGERY    . SMALL INTESTINE SURGERY     has had 6 total bowel obstructions -mostlt adhesions-last 2009  . TONSILLECTOMY     age 19  . TRIGGER FINGER RELEASE  2/12   rt middle  . TRIGGER FINGER RELEASE  2010   lt middle  . ULNAR NERVE TRANSPOSITION Right 05/11/2014   Procedure: DECOMPRESSION ULNAR NERVE RIGHT ELBOW ;  Surgeon: Daryll Brod, MD;  Location: [facility];  Service: Orthopedics;  Laterality: Right;  . ULNAR NERVE TRANSPOSITION Left 07/06/2014   Procedure: DECOMPRESSION  LEFT ULNAR NERVE;  Surgeon: Daryll Brod, MD;  Location: [facility];  Service: Orthopedics;  Laterality: Left;   Social History   Socioeconomic History  . Marital status: Married    Spouse name: None  . Number of children: None  . Years of education: None  . Highest education level: None  Social Needs  . Financial resource strain: None  . Food insecurity - worry: None  . Food insecurity - inability: None  . Transportation needs - medical: None  . Transportation needs - non-medical: None  Occupational History  . None  Tobacco Use  . Smoking status: Never Smoker  . Smokeless tobacco: Never Used  Substance and Sexual Activity  . Alcohol use: No  . Drug use: No  . Sexual activity: None  Other Topics Concern  . None  Social History Narrative  . None   Allergies  Allergen Reactions  .  Versed [Midazolam] Other (See Comments)  . Diprivan [Propofol] Other (See Comments)    Uncontrolled shaking  . Fentanyl   . Sevoflurane   . Zofran [Ondansetron Hcl]    No family history on file.  No history of autoimmune   Past medical history, social, surgical and family history all reviewed in electronic medical record.  No pertanent information unless stated regarding to the chief complaint.   Review of Systems:Review of systems updated and as accurate as of 05/28/17  No headache, visual changes, nausea, vomiting, diarrhea, constipation, dizziness, abdominal  pain, skin rash, fevers, chills, night sweats, weight loss, swollen lymph nodes, body aches, joint swelling, muscle aches, chest pain, shortness of breath, mood changes.   Objective  Blood pressure 110/70, pulse 85, height 5' 6.5" (1.689 m), weight 143 lb (64.9 kg), SpO2 98 %. Systems examined below as of 05/28/17   General: No apparent distress alert and oriented x3 mood and affect normal, dressed appropriately.  HEENT: Pupils equal, extraocular movements intact  Respiratory: Patient's speak in full sentences and does not appear short of breath  Cardiovascular: No lower extremity edema, non tender, no erythema  Skin: Warm dry intact with no signs of infection or rash on extremities or on axial skeleton.  Abdomen: Soft nontender  Neuro: Cranial nerves II through XII are intact, neurovascularly intact in all extremities with 2+ DTRs and 2+ pulses.  Lymph: No lymphadenopathy of posterior or anterior cervical chain or axillae bilaterally.  Gait normal with good balance and coordination.  MSK: Mild tender with full range of motion and good stability and symmetric strength and tone of shoulders, elbows, wrist, hip, knee and ankles bilaterally.  Arthritic changes of multiple joints Back Exam:  Inspection: Degenerative scoliosis with loss of lordosis Motion: Flexion 35 deg, Extension 25 deg, Side Bending to 35 deg bilaterally,  Rotation to 35 deg bilaterally  SLR laying: Negative  XSLR laying: Negative  Palpable tenderness: Tender to palpation the paraspinal musculature of the lumbar spine fracture. FABER: Positive left. Sensory change: Gross sensation intact to all lumbar and sacral dermatomes.  Reflexes: 2+ at both patellar tendons, 2+ at achilles tendons, Babinski's downgoing.  Strength at foot  Plantar-flexion: 5/5 Dorsi-flexion: 5/5 Eversion: 5/5 Inversion: 5/5  Leg strength  Quad: 5/5 Hamstring: 5/5 Hip flexor: 5/5 Hip abductors: 4/5 but symmetric     Impression and Recommendations:       This case required medical decision making of moderate complexity.      Note: This dictation was prepared with Dragon dictation along with smaller phrase technology. Any transcriptional errors that result from this process are unintentional.

## 2017-05-28 ENCOUNTER — Encounter: Payer: Self-pay | Admitting: Family Medicine

## 2017-05-28 ENCOUNTER — Ambulatory Visit: Payer: PPO | Admitting: Family Medicine

## 2017-05-28 VITALS — BP 110/70 | HR 85 | Ht 66.5 in | Wt 143.0 lb

## 2017-05-28 DIAGNOSIS — M999 Biomechanical lesion, unspecified: Secondary | ICD-10-CM | POA: Diagnosis not present

## 2017-05-28 DIAGNOSIS — M48061 Spinal stenosis, lumbar region without neurogenic claudication: Secondary | ICD-10-CM

## 2017-05-28 NOTE — Patient Instructions (Signed)
Good to see you  Gloria [name] is your friend.  Stay active.  I am impressed overall  See me again in 3 weeks

## 2017-05-28 NOTE — Assessment & Plan Note (Signed)
Continues to have degenerative changes.  Does respond well to the osteopathic manipulation.  We discussed icing regimen and home exercises.  We discussed which activities to do which wants to avoid.  Follow-up again in 4-8 weeks

## 2017-05-28 NOTE — Assessment & Plan Note (Signed)
Decision today to treat with OMT was based on Physical Exam  After verbal consent patient was treated with HVLA, ME, FPR techniques in cervical, thoracic, rib, lumbar and sacral areas  Patient tolerated the procedure well with improvement in symptoms  Patient given exercises, stretches and lifestyle modifications  See medications in patient instructions if given  Patient will follow up in 4-6 weeks 

## 2017-06-11 DIAGNOSIS — E7800 Pure hypercholesterolemia, unspecified: Secondary | ICD-10-CM | POA: Diagnosis not present

## 2017-06-11 DIAGNOSIS — E559 Vitamin D deficiency, unspecified: Secondary | ICD-10-CM | POA: Diagnosis not present

## 2017-06-11 DIAGNOSIS — F325 Major depressive disorder, single episode, in full remission: Secondary | ICD-10-CM | POA: Diagnosis not present

## 2017-06-11 DIAGNOSIS — I10 Essential (primary) hypertension: Secondary | ICD-10-CM | POA: Diagnosis not present

## 2017-06-11 DIAGNOSIS — F419 Anxiety disorder, unspecified: Secondary | ICD-10-CM | POA: Diagnosis not present

## 2017-06-17 NOTE — Progress Notes (Signed)
[name] [address] [place], [place] [address] Phone: [phone] Subjective:      CC: Low back pain.  SNK:[id number]  Gloria [name] is a 72 y.o. female coming in with complaint of degenerative scoliosis.  Went to physical therapy and was making improvement.  Started osteopathic manipulation vitamin D.  Patient states that she is doing a little bit better. She does have pain with walking.       Past Medical History:  Diagnosis Date  . Anxiety   . Arthritis   . CKD (chronic kidney disease), stage III ([place])   . Complication of anesthesia 05/2014   had body shaking-observed over night-see anesthesia note-has had tremors post op in past  . Depression   . Hyperlipemia   . Hypertension   . Insomnia    Past Surgical History:  Procedure Laterality Date  . ABDOMINAL HYSTERECTOMY     age 44  . BACK SURGERY    . CARPAL TUNNEL RELEASE  01/20/2012   Procedure: CARPAL TUNNEL RELEASE;  Surgeon: Wynonia Sours, MD;  Location: [facility];  Service: Orthopedics;  Laterality: Right;  . CARPAL TUNNEL RELEASE  02/25/2012   Procedure: CARPAL TUNNEL RELEASE;  Surgeon: Wynonia Sours, MD;  Location: [facility];  Service: Orthopedics;  Laterality: Left;  RELEASE A-1 PULLEY LRF  . COLONOSCOPY WITH PROPOFOL N/A 10/15/2016   Procedure: COLONOSCOPY WITH PROPOFOL;  Surgeon: Wilford Corner, MD;  Location: [facility];  Service: Endoscopy;  Laterality: N/A;  . DISTAL BICEPS TENDON REPAIR Left 06/12/2015   Procedure: REPAIR LEFT BICEPS TENDON AND DECOMPRESSION OF MEDIAN NERVE;  Surgeon: Daryll Brod, MD;  Location: [facility];  Service: Orthopedics;  Laterality: Left;  . SMALL INTESTINE SURGERY    . SMALL INTESTINE SURGERY     has had 6 total bowel obstructions -mostlt adhesions-last 2009  . TONSILLECTOMY     age 8  . TRIGGER FINGER RELEASE  2/12   rt middle  . TRIGGER FINGER RELEASE  2010   lt middle  . ULNAR NERVE  TRANSPOSITION Right 05/11/2014   Procedure: DECOMPRESSION ULNAR NERVE RIGHT ELBOW ;  Surgeon: Daryll Brod, MD;  Location: [facility];  Service: Orthopedics;  Laterality: Right;  . ULNAR NERVE TRANSPOSITION Left 07/06/2014   Procedure: DECOMPRESSION  LEFT ULNAR NERVE;  Surgeon: Daryll Brod, MD;  Location: [facility];  Service: Orthopedics;  Laterality: Left;   Social History   Socioeconomic History  . Marital status: Married    Spouse name: None  . Number of children: None  . Years of education: None  . Highest education level: None  Social Needs  . Financial resource strain: None  . Food insecurity - worry: None  . Food insecurity - inability: None  . Transportation needs - medical: None  . Transportation needs - non-medical: None  Occupational History  . None  Tobacco Use  . Smoking status: Never Smoker  . Smokeless tobacco: Never Used  Substance and Sexual Activity  . Alcohol use: No  . Drug use: No  . Sexual activity: None  Other Topics Concern  . None  Social History Narrative  . None   Allergies  Allergen Reactions  . Versed [Midazolam] Other (See Comments)  . Diprivan [Propofol] Other (See Comments)    Uncontrolled shaking  . Fentanyl   . Sevoflurane   . Zofran [Ondansetron Hcl]    No family history on file.  No family  history of autoimmune disease   Past medical history, social, surgical and family history all reviewed in electronic medical record.  No pertanent information unless stated regarding to the chief complaint.   Review of Systems:Review of systems updated and as accurate as of 06/18/17  No headache, visual changes, nausea, vomiting, diarrhea, constipation, dizziness, abdominal pain, skin rash, fevers, chills, night sweats, weight loss, swollen lymph nodes, body aches, joint swelling, chest pain, shortness of breath, mood changes.  Positive muscle aches Objective  Blood pressure 128/82, pulse 74, height 5' 6.25" (1.683 m),  weight 144 lb (65.3 kg), SpO2 97 %. Systems examined below as of 06/18/17   General: No apparent distress alert and oriented x3 mood and affect normal, dressed appropriately.  HEENT: Pupils equal, extraocular movements intact  Respiratory: Patient's speak in full sentences and does not appear short of breath  Cardiovascular: No lower extremity edema, non tender, no erythema  Skin: Warm dry intact with no signs of infection or rash on extremities or on axial skeleton.  Abdomen: Soft nontender  Neuro: Cranial nerves II through XII are intact, neurovascularly intact in all extremities with 2+ DTRs and 2+ pulses.  Lymph: No lymphadenopathy of posterior or anterior cervical chain or axillae bilaterally.  Gait normal with good balance and coordination.  MSK:  Non tender with full range of motion and good stability and symmetric strength and tone of shoulders, elbows, wrist, hip, knee and ankles bilaterally.  Mild arthritic changes of multiple joints Back Exam:  Inspection: Degenerative scoliosis noted Motion: Flexion 40 deg, Extension 20 deg, Side Bending to 35 deg bilaterally,  Rotation to 35 deg bilaterally  SLR laying: Negative  XSLR laying: Negative  Palpable tenderness: Tender to palpation in the paraspinal musculature.Marland Kitchen FABER: negative. Sensory change: Gross sensation intact to all lumbar and sacral dermatomes.  Reflexes: 2+ at both patellar tendons, 2+ at achilles tendons, Babinski's downgoing.  Strength at foot  Plantar-flexion: 5/5 Dorsi-flexion: 5/5 Eversion: 5/5 Inversion: 5/5  Leg strength  Quad: 5/5 Hamstring: 5/5 Hip flexor: 5/5 Hip abductors: 4/5  Gait unremarkable.  Osteopathic findings C2 flexed rotated and side bent right C4 flexed rotated and side bent left C7 flexed rotated and side bent left T3 extended rotated and side bent right inhaled third rib T6 extended rotated and side bent left L1 flexed rotated and side bent right Sacrum right on right     Impression  and Recommendations:     This case required medical decision making of moderate complexity.      Note: This dictation was prepared with Dragon dictation along with smaller phrase technology. Any transcriptional errors that result from this process are unintentional.

## 2017-06-18 ENCOUNTER — Encounter: Payer: Self-pay | Admitting: Family Medicine

## 2017-06-18 ENCOUNTER — Ambulatory Visit: Payer: PPO | Admitting: Family Medicine

## 2017-06-18 VITALS — BP 128/82 | HR 74 | Ht 66.25 in | Wt 144.0 lb

## 2017-06-18 DIAGNOSIS — M48061 Spinal stenosis, lumbar region without neurogenic claudication: Secondary | ICD-10-CM

## 2017-06-18 DIAGNOSIS — M999 Biomechanical lesion, unspecified: Secondary | ICD-10-CM | POA: Diagnosis not present

## 2017-06-18 NOTE — Assessment & Plan Note (Signed)
Decision today to treat with OMT was based on Physical Exam  After verbal consent patient was treated with HVLA, ME, FPR techniques in  thoracic, lumbar and sacral areas  Patient tolerated the procedure well with improvement in symptoms  Patient given exercises, stretches and lifestyle modifications  See medications in patient instructions if given  Patient will follow up in 4-6 weeks 

## 2017-06-18 NOTE — Patient Instructions (Signed)
Good to see you  Vega sport protein powder Stay active Eat within 30 minutes of working out and stay hydrated See me again in 3 weeks

## 2017-06-18 NOTE — Assessment & Plan Note (Signed)
Known to have degenerative scoliosis.  Conservative therapy.  Underlying rheumatoid arthritis can cause inflammation and exacerbations from time to time.  Patient does not want any advanced imaging and is making progress.  Still has pain when she walks a significant amount.  Discussed core strengthening, proper nutrition, follow-up again in 4-6 weeks

## 2017-06-30 ENCOUNTER — Other Ambulatory Visit: Payer: Self-pay | Admitting: Family Medicine

## 2017-07-06 NOTE — Progress Notes (Signed)
[name] [address], [place] [address] Phone: [phone] Subjective:     CC: back pain follow up   [id number]  Gloria [name] is a 72 y.o. female coming in with complaint of back pain. She states that her back has been better than last visit. Pain is still there but intermittent and unpredictable. Often times pain arises in a supine position.  Overall the patient has been doing well.  Feels like member have to avoid certain activities.    Past Medical History:  Diagnosis Date  . Anxiety   . Arthritis   . CKD (chronic kidney disease), stage III ([place])   . Complication of anesthesia 05/2014   had body shaking-observed over night-see anesthesia note-has had tremors post op in past  . Depression   . Hyperlipemia   . Hypertension   . Insomnia    Past Surgical History:  Procedure Laterality Date  . ABDOMINAL HYSTERECTOMY     age 17  . BACK SURGERY    . CARPAL TUNNEL RELEASE  01/20/2012   Procedure: CARPAL TUNNEL RELEASE;  Surgeon: Wynonia Sours, MD;  Location: [facility];  Service: Orthopedics;  Laterality: Right;  . CARPAL TUNNEL RELEASE  02/25/2012   Procedure: CARPAL TUNNEL RELEASE;  Surgeon: Wynonia Sours, MD;  Location: [facility];  Service: Orthopedics;  Laterality: Left;  RELEASE A-1 PULLEY LRF  . COLONOSCOPY WITH PROPOFOL N/A 10/15/2016   Procedure: COLONOSCOPY WITH PROPOFOL;  Surgeon: Wilford Corner, MD;  Location: [facility];  Service: Endoscopy;  Laterality: N/A;  . DISTAL BICEPS TENDON REPAIR Left 06/12/2015   Procedure: REPAIR LEFT BICEPS TENDON AND DECOMPRESSION OF MEDIAN NERVE;  Surgeon: Daryll Brod, MD;  Location: [facility];  Service: Orthopedics;  Laterality: Left;  . SMALL INTESTINE SURGERY    . SMALL INTESTINE SURGERY     has had 6 total bowel obstructions -mostlt adhesions-last 2009  . TONSILLECTOMY     age 73  . TRIGGER FINGER RELEASE  2/12   rt middle  . TRIGGER  FINGER RELEASE  2010   lt middle  . ULNAR NERVE TRANSPOSITION Right 05/11/2014   Procedure: DECOMPRESSION ULNAR NERVE RIGHT ELBOW ;  Surgeon: Daryll Brod, MD;  Location: [facility];  Service: Orthopedics;  Laterality: Right;  . ULNAR NERVE TRANSPOSITION Left 07/06/2014   Procedure: DECOMPRESSION  LEFT ULNAR NERVE;  Surgeon: Daryll Brod, MD;  Location: [facility];  Service: Orthopedics;  Laterality: Left;   Social History   Socioeconomic History  . Marital status: Married    Spouse name: Not on file  . Number of children: Not on file  . Years of education: Not on file  . Highest education level: Not on file  Social Needs  . Financial resource strain: Not on file  . Food insecurity - worry: Not on file  . Food insecurity - inability: Not on file  . Transportation needs - medical: Not on file  . Transportation needs - non-medical: Not on file  Occupational History  . Not on file  Tobacco Use  . Smoking status: Never Smoker  . Smokeless tobacco: Never Used  Substance and Sexual Activity  . Alcohol use: No  . Drug use: No  . Sexual activity: Not on file  Other Topics Concern  . Not on file  Social History Narrative  . Not on file   Allergies  Allergen Reactions  . Versed [Midazolam] Other (See Comments)  .  Diprivan [Propofol] Other (See Comments)    Uncontrolled shaking  . Fentanyl   . Sevoflurane   . Zofran [Ondansetron Hcl]    No family history on file.   Past medical history, social, surgical and family history all reviewed in electronic medical record.  No pertanent information unless stated regarding to the chief complaint.   Review of Systems:Review of systems updated and as accurate as of 07/07/17  No headache, visual changes, nausea, vomiting, diarrhea, constipation, dizziness, abdominal pain, skin rash, fevers, chills, night sweats, weight loss, swollen lymph nodes, body aches, joint swelling, chest pain, shortness of breath, mood changes.   Positive muscle aches  Objective  Blood pressure 120/86, pulse 89, height 5' 6.25" (1.683 m), weight 141 lb (64 kg), SpO2 98 %. Systems examined below as of 07/07/17   General: No apparent distress alert and oriented x3 mood and affect normal, dressed appropriately.  HEENT: Pupils equal, extraocular movements intact  Respiratory: Patient's speak in full sentences and does not appear short of breath  Cardiovascular: No lower extremity edema, non tender, no erythema  Skin: Warm dry intact with no signs of infection or rash on extremities or on axial skeleton.  Abdomen: Soft nontender  Neuro: Cranial nerves II through XII are intact, neurovascularly intact in all extremities with 2+ DTRs and 2+ pulses.  Lymph: No lymphadenopathy of posterior or anterior cervical chain or axillae bilaterally.  Gait normal with good balance and coordination.  MSK: Mild tender with full range of motion and good stability and symmetric strength and tone of shoulders, elbows, wrist, hip, knee and ankles bilaterally.  Patient back exam does show some degenerative scoliosis.  Some decreased range of motion in all planes.  Mild positive Corky Sox on the right side but negative straight leg test bilaterally.  4+ out of 5 strength but is symmetric.  Osteopathic findings  C7  flexed rotated and side bent left T3 extended rotated and side bent right inhaled third rib L1 flexed rotated and side bent right Sacrum right on right      Impression and Recommendations:     This case required medical decision making of moderate complexity.      Note: This dictation was prepared with Dragon dictation along with smaller phrase technology. Any transcriptional errors that result from this process are unintentional.

## 2017-07-07 ENCOUNTER — Ambulatory Visit: Payer: PPO | Admitting: Family Medicine

## 2017-07-07 VITALS — BP 120/86 | HR 89 | Ht 66.25 in | Wt 141.0 lb

## 2017-07-07 DIAGNOSIS — M48061 Spinal stenosis, lumbar region without neurogenic claudication: Secondary | ICD-10-CM | POA: Diagnosis not present

## 2017-07-07 DIAGNOSIS — M999 Biomechanical lesion, unspecified: Secondary | ICD-10-CM | POA: Diagnosis not present

## 2017-07-07 NOTE — Assessment & Plan Note (Signed)
Degenerative scoliosis noted.  Patient does have spinal stenosis but responding well to the over-the-counter medications, low-dose gabapentin, home exercises and manipulation.  No change in management.  Follow-up again in 4-6 weeks

## 2017-07-07 NOTE — Assessment & Plan Note (Signed)
Decision today to treat with OMT was based on Physical Exam  After verbal consent patient was treated with HVLA, ME, FPR techniques in  thoracic, lumbar and sacral areas  Patient tolerated the procedure well with improvement in symptoms  Patient given exercises, stretches and lifestyle modifications  See medications in patient instructions if given  Patient will follow up in 4-6 weeks 

## 2017-07-07 NOTE — Patient Instructions (Signed)
Good to see you  Gloria [name] is your friend.  [place] for the vacuum  See me again I n3-4 weeks

## 2017-07-15 DIAGNOSIS — H16223 Keratoconjunctivitis sicca, not specified as Sjogren's, bilateral: Secondary | ICD-10-CM | POA: Diagnosis not present

## 2017-07-28 ENCOUNTER — Encounter: Payer: Self-pay | Admitting: Family Medicine

## 2017-07-28 ENCOUNTER — Ambulatory Visit: Payer: PPO | Admitting: Family Medicine

## 2017-07-28 VITALS — BP 144/74 | HR 73 | Ht 66.0 in | Wt 141.0 lb

## 2017-07-28 DIAGNOSIS — M48061 Spinal stenosis, lumbar region without neurogenic claudication: Secondary | ICD-10-CM | POA: Diagnosis not present

## 2017-07-28 DIAGNOSIS — M999 Biomechanical lesion, unspecified: Secondary | ICD-10-CM

## 2017-07-28 NOTE — Assessment & Plan Note (Signed)
Degenerative scoliosis noted.  Discussed icing regimen and home exercises.  Return in 6 weeks.

## 2017-07-28 NOTE — Patient Instructions (Signed)
Good to see you  Gloria [name] is your friend.  I am impressed keep it up  Water right when you wake up and eat something every 2 hours for your metabolism.  flonase each nostril daily for 2 weeks  See me again in 3-4 weeks

## 2017-07-28 NOTE — Progress Notes (Signed)
[name] [place], [place] [address] Phone: [phone] Subjective:     CC: Back pain follow-up  [id number]  Gloria [name] is a 72 y.o. female coming in with complaint of back pain.  Does have degenerative scoliosis of the lumbar spine.  Discussed icing regimen and home exercises which patient has been doing regularly.  Has been increasing activity slowly.    Past Medical History:  Diagnosis Date  . Anxiety   . Arthritis   . CKD (chronic kidney disease), stage III ([place])   . Complication of anesthesia 05/2014   had body shaking-observed over night-see anesthesia note-has had tremors post op in past  . Depression   . Hyperlipemia   . Hypertension   . Insomnia    Past Surgical History:  Procedure Laterality Date  . ABDOMINAL HYSTERECTOMY     age 57  . BACK SURGERY    . CARPAL TUNNEL RELEASE  01/20/2012   Procedure: CARPAL TUNNEL RELEASE;  Surgeon: Wynonia Sours, MD;  Location: [facility];  Service: Orthopedics;  Laterality: Right;  . CARPAL TUNNEL RELEASE  02/25/2012   Procedure: CARPAL TUNNEL RELEASE;  Surgeon: Wynonia Sours, MD;  Location: [facility];  Service: Orthopedics;  Laterality: Left;  RELEASE A-1 PULLEY LRF  . COLONOSCOPY WITH PROPOFOL N/A 10/15/2016   Procedure: COLONOSCOPY WITH PROPOFOL;  Surgeon: Wilford Corner, MD;  Location: [facility];  Service: Endoscopy;  Laterality: N/A;  . DISTAL BICEPS TENDON REPAIR Left 06/12/2015   Procedure: REPAIR LEFT BICEPS TENDON AND DECOMPRESSION OF MEDIAN NERVE;  Surgeon: Daryll Brod, MD;  Location: [facility];  Service: Orthopedics;  Laterality: Left;  . SMALL INTESTINE SURGERY    . SMALL INTESTINE SURGERY     has had 6 total bowel obstructions -mostlt adhesions-last 2009  . TONSILLECTOMY     age 38  . TRIGGER FINGER RELEASE  2/12   rt middle  . TRIGGER FINGER RELEASE  2010   lt middle  . ULNAR NERVE TRANSPOSITION Right 05/11/2014   Procedure: DECOMPRESSION ULNAR NERVE RIGHT ELBOW ;  Surgeon: Daryll Brod, MD;  Location: [facility];  Service: Orthopedics;  Laterality: Right;  . ULNAR NERVE TRANSPOSITION Left 07/06/2014   Procedure: DECOMPRESSION  LEFT ULNAR NERVE;  Surgeon: Daryll Brod, MD;  Location: [facility];  Service: Orthopedics;  Laterality: Left;   Social History   Socioeconomic History  . Marital status: Married    Spouse name: Not on file  . Number of children: Not on file  . Years of education: Not on file  . Highest education level: Not on file  Occupational History  . Not on file  Social Needs  . Financial resource strain: Not on file  . Food insecurity:    Worry: Not on file    Inability: Not on file  . Transportation needs:    Medical: Not on file    Non-medical: Not on file  Tobacco Use  . Smoking status: Never Smoker  . Smokeless tobacco: Never Used  Substance and Sexual Activity  . Alcohol use: No  . Drug use: No  . Sexual activity: Not on file  Lifestyle  . Physical activity:    Days per week: Not on file    Minutes per session: Not on file  . Stress: Not on file  Relationships  . Social connections:    Talks on phone: Not on file    Gets together:  Not on file    Attends religious service: Not on file    Active member of club or organization: Not on file    Attends meetings of clubs or organizations: Not on file    Relationship status: Not on file  Other Topics Concern  . Not on file  Social History Narrative  . Not on file   Allergies  Allergen Reactions  . Versed [Midazolam] Other (See Comments)  . Diprivan [Propofol] Other (See Comments)    Uncontrolled shaking  . Fentanyl   . Sevoflurane   . Zofran [Ondansetron Hcl]    No family history on file.  No family history of autoimmune   Past medical history, social, surgical and family history all reviewed in electronic medical record.  No pertanent information unless stated regarding to the  chief complaint.   Review of Systems:Review of systems updated and as accurate as of 07/28/17  No headache, visual changes, nausea, vomiting, diarrhea, constipation, dizziness, abdominal pain, skin rash, fevers, chills, night sweats, weight loss, swollen lymph nodes, body aches, joint swelling, muscle aches, chest pain, shortness of breath, mood changes.   Objective  Blood pressure (!) 144/74, pulse 73, height 5\' 6"  (1.676 m), weight 141 lb (64 kg), SpO2 97 %. Systems examined below as of 07/28/17   General: No apparent distress alert and oriented x3 mood and affect normal, dressed appropriately.  HEENT: Pupils equal, extraocular movements intact  Respiratory: Patient's speak in full sentences and does not appear short of breath  Cardiovascular: No lower extremity edema, non tender, no erythema  Skin: Warm dry intact with no signs of infection or rash on extremities or on axial skeleton.  Abdomen: Soft nontender  Neuro: Cranial nerves II through XII are intact, neurovascularly intact in all extremities with 2+ DTRs and 2+ pulses.  Lymph: No lymphadenopathy of posterior or anterior cervical chain or axillae bilaterally.  Gait normal with good balance and coordination.  MSK:  Non tender with full range of motion and good stability and symmetric strength and tone of shoulders, elbows, wrist, hip, knee and ankles bilaterally.  Degenerative arthritis.  Patient's back does have degenerative scoliosis noted.  Patient does have some mild positive Corky Sox on the right side.  Mild tightness of the paraspinal musculature of the right side of the lumbar spine.  Osteopathic findings C6 flexed rotated and side bent left T3-7 N R right and sidebent left  L2-5 Neutral  Rotated left and side bent right Sacrum right on right     Impression and Recommendations:     This case required medical decision making of moderate complexity.      Note: This dictation was prepared with Dragon dictation along  with smaller phrase technology. Any transcriptional errors that result from this process are unintentional.

## 2017-07-28 NOTE — Assessment & Plan Note (Addendum)
Decision today to treat with OMT was based on Physical Exam  After verbal consent patient was treated with HVLA, ME, FPR techniques in cervical, thoracic, rib, lumbar and sacral areas  Patient tolerated the procedure well with improvement in symptoms  Patient given exercises, stretches and lifestyle modifications  See medications in patient instructions if given  Patient will follow up in 6 weeks 

## 2017-08-11 ENCOUNTER — Other Ambulatory Visit: Payer: Self-pay | Admitting: Family Medicine

## 2017-08-17 NOTE — Progress Notes (Signed)
[name] [place], [place] [address] Phone: [phone] Subjective:     CC: Back pain follow-up  DTO:[id number]  Gloria [name] is a 72 y.o. female coming in with complaint of back pain.  Found to have degenerative scoliosis.  Has been responding fairly well to osteopathic manipulation.  Patient has been doing gabapentin intermittently.  Doing exercises off and on.  No radiation of the legs.  Still has discomfort all the time.     Past Medical History:  Diagnosis Date  . Anxiety   . Arthritis   . CKD (chronic kidney disease), stage III ([place])   . Complication of anesthesia 05/2014   had body shaking-observed over night-see anesthesia note-has had tremors post op in past  . Depression   . Hyperlipemia   . Hypertension   . Insomnia    Past Surgical History:  Procedure Laterality Date  . ABDOMINAL HYSTERECTOMY     age 89  . BACK SURGERY    . CARPAL TUNNEL RELEASE  01/20/2012   Procedure: CARPAL TUNNEL RELEASE;  Surgeon: Wynonia Sours, MD;  Location: [facility];  Service: Orthopedics;  Laterality: Right;  . CARPAL TUNNEL RELEASE  02/25/2012   Procedure: CARPAL TUNNEL RELEASE;  Surgeon: Wynonia Sours, MD;  Location: [facility];  Service: Orthopedics;  Laterality: Left;  RELEASE A-1 PULLEY LRF  . COLONOSCOPY WITH PROPOFOL N/A 10/15/2016   Procedure: COLONOSCOPY WITH PROPOFOL;  Surgeon: Wilford Corner, MD;  Location: [facility];  Service: Endoscopy;  Laterality: N/A;  . DISTAL BICEPS TENDON REPAIR Left 06/12/2015   Procedure: REPAIR LEFT BICEPS TENDON AND DECOMPRESSION OF MEDIAN NERVE;  Surgeon: Daryll Brod, MD;  Location: [facility];  Service: Orthopedics;  Laterality: Left;  . SMALL INTESTINE SURGERY    . SMALL INTESTINE SURGERY     has had 6 total bowel obstructions -mostlt adhesions-last 2009  . TONSILLECTOMY     age 10  . TRIGGER FINGER RELEASE  2/12   rt middle  . TRIGGER FINGER RELEASE   2010   lt middle  . ULNAR NERVE TRANSPOSITION Right 05/11/2014   Procedure: DECOMPRESSION ULNAR NERVE RIGHT ELBOW ;  Surgeon: Daryll Brod, MD;  Location: [facility];  Service: Orthopedics;  Laterality: Right;  . ULNAR NERVE TRANSPOSITION Left 07/06/2014   Procedure: DECOMPRESSION  LEFT ULNAR NERVE;  Surgeon: Daryll Brod, MD;  Location: [facility];  Service: Orthopedics;  Laterality: Left;   Social History   Socioeconomic History  . Marital status: Married    Spouse name: Not on file  . Number of children: Not on file  . Years of education: Not on file  . Highest education level: Not on file  Occupational History  . Not on file  Social Needs  . Financial resource strain: Not on file  . Food insecurity:    Worry: Not on file    Inability: Not on file  . Transportation needs:    Medical: Not on file    Non-medical: Not on file  Tobacco Use  . Smoking status: Never Smoker  . Smokeless tobacco: Never Used  Substance and Sexual Activity  . Alcohol use: No  . Drug use: No  . Sexual activity: Not on file  Lifestyle  . Physical activity:    Days per week: Not on file    Minutes per session: Not on file  . Stress: Not on file  Relationships  . Social  connections:    Talks on phone: Not on file    Gets together: Not on file    Attends religious service: Not on file    Active member of club or organization: Not on file    Attends meetings of clubs or organizations: Not on file    Relationship status: Not on file  Other Topics Concern  . Not on file  Social History Narrative  . Not on file   Allergies  Allergen Reactions  . Versed [Midazolam] Other (See Comments)  . Diprivan [Propofol] Other (See Comments)    Uncontrolled shaking  . Fentanyl   . Sevoflurane   . Zofran [Ondansetron Hcl]    No family history on file.  No family history of autoimmune   Past medical history, social, surgical and family history all reviewed in electronic medical  record.  No pertanent information unless stated regarding to the chief complaint.   Review of Systems:Review of systems updated and as accurate as of 08/18/17  No headache, visual changes, nausea, vomiting, diarrhea, constipation, dizziness, abdominal pain, skin rash, fevers, chills, night sweats, weight loss, swollen lymph nodes, body aches, joint swelling, chest pain, shortness of breath, mood changes.  Positive muscle aches  Objective  Blood pressure (!) 134/94, pulse 70, weight 135 lb (61.2 kg), SpO2 98 %. Systems examined below as of 08/18/17   General: No apparent distress alert and oriented x3 mood and affect normal, dressed appropriately.  HEENT: Pupils equal, extraocular movements intact  Respiratory: Patient's speak in full sentences and does not appear short of breath  Cardiovascular: No lower extremity edema, non tender, no erythema  Skin: Warm dry intact with no signs of infection or rash on extremities or on axial skeleton.  Abdomen: Soft nontender  Neuro: Cranial nerves II through XII are intact, neurovascularly intact in all extremities with 2+ DTRs and 2+ pulses.  Lymph: No lymphadenopathy of posterior or anterior cervical chain or axillae bilaterally.  Gait normal with good balance and coordination.  MSK:  Non tender with full range of motion and good stability and symmetric strength and tone of shoulders, elbows, wrist, hip, knee and ankles bilaterally.  Arthritic changes noted. Degenerative scoliosis of the lumbar spine.  Tightness in all planes.  Positive Corky Sox on the right 4+ out of 5 strength and symmetric  Osteopathic findings C6 flexed rotated and side bent left T3-7 N R right and sidebent left  L2-5 Neutral  Rotated left and side bent right Sacrum right on right       Impression and Recommendations:     This case required medical decision making of moderate complexity.      Note: This dictation was prepared with Dragon dictation along with smaller  phrase technology. Any transcriptional errors that result from this process are unintentional.

## 2017-08-18 ENCOUNTER — Encounter: Payer: Self-pay | Admitting: Family Medicine

## 2017-08-18 ENCOUNTER — Ambulatory Visit: Payer: PPO | Admitting: Family Medicine

## 2017-08-18 VITALS — BP 134/94 | HR 70 | Wt 135.0 lb

## 2017-08-18 DIAGNOSIS — M48061 Spinal stenosis, lumbar region without neurogenic claudication: Secondary | ICD-10-CM

## 2017-08-18 DIAGNOSIS — M999 Biomechanical lesion, unspecified: Secondary | ICD-10-CM | POA: Diagnosis not present

## 2017-08-18 NOTE — Assessment & Plan Note (Signed)
Decision today to treat with OMT was based on Physical Exam  After verbal consent patient was treated with HVLA, ME, FPR techniques in cervical, thoracic, rib, lumbar and sacral areas  Patient tolerated the procedure well with improvement in symptoms  Patient given exercises, stretches and lifestyle modifications  See medications in patient instructions if given  Patient will follow up in 3-4 weeks 

## 2017-08-18 NOTE — Patient Instructions (Signed)
Good to see you  Alvera Singh is your friend.  Keep it up  See me again in 3-4 weeks

## 2017-08-18 NOTE — Assessment & Plan Note (Signed)
Degenerative scoliosis.  Discussed icing regimen.  Which activities To do which wants to avoid.  Responded well to manipulation.  Follow-up again in 3-4 weeks.

## 2017-09-02 ENCOUNTER — Other Ambulatory Visit: Payer: Self-pay | Admitting: Family Medicine

## 2017-09-02 NOTE — Telephone Encounter (Signed)
Refill done.  

## 2017-09-14 NOTE — Progress Notes (Signed)
[name] [name], [name] [address] Phone: [phone] Subjective:     CC: Back pain follow-up  UVO:[id number]  Gloria [name] is a 72 y.o. female coming in with complaint of back pain.  Patient does have significant degenerative scoliosis. Patient has had lumbar radiculopathy previously but has been doing very well with conservative therapy.  Patient did do to physical therapy and has responded well to osteopathic manipulation.  Recently though having more discomfort in the right buttocks area.  Some mild radiation down the leg.  Describes the pain as a 7 out of 10.      Past Medical History:  Diagnosis Date  . Anxiety   . Arthritis   . CKD (chronic kidney disease), stage III ([place])   . Complication of anesthesia 05/2014   had body shaking-observed over night-see anesthesia note-has had tremors post op in past  . Depression   . Hyperlipemia   . Hypertension   . Insomnia    Past Surgical History:  Procedure Laterality Date  . ABDOMINAL HYSTERECTOMY     age 62  . BACK SURGERY    . CARPAL TUNNEL RELEASE  01/20/2012   Procedure: CARPAL TUNNEL RELEASE;  Surgeon: Wynonia Sours, MD;  Location: [facility];  Service: Orthopedics;  Laterality: Right;  . CARPAL TUNNEL RELEASE  02/25/2012   Procedure: CARPAL TUNNEL RELEASE;  Surgeon: Wynonia Sours, MD;  Location: [facility];  Service: Orthopedics;  Laterality: Left;  RELEASE A-1 PULLEY LRF  . COLONOSCOPY WITH PROPOFOL N/A 10/15/2016   Procedure: COLONOSCOPY WITH PROPOFOL;  Surgeon: Wilford Corner, MD;  Location: [facility];  Service: Endoscopy;  Laterality: N/A;  . DISTAL BICEPS TENDON REPAIR Left 06/12/2015   Procedure: REPAIR LEFT BICEPS TENDON AND DECOMPRESSION OF MEDIAN NERVE;  Surgeon: Daryll Brod, MD;  Location: [facility];  Service: Orthopedics;  Laterality: Left;  . SMALL INTESTINE SURGERY    . SMALL INTESTINE SURGERY     has had 6 total  bowel obstructions -mostlt adhesions-last 2009  . TONSILLECTOMY     age 31  . TRIGGER FINGER RELEASE  2/12   rt middle  . TRIGGER FINGER RELEASE  2010   lt middle  . ULNAR NERVE TRANSPOSITION Right 05/11/2014   Procedure: DECOMPRESSION ULNAR NERVE RIGHT ELBOW ;  Surgeon: Daryll Brod, MD;  Location: [facility];  Service: Orthopedics;  Laterality: Right;  . ULNAR NERVE TRANSPOSITION Left 07/06/2014   Procedure: DECOMPRESSION  LEFT ULNAR NERVE;  Surgeon: Daryll Brod, MD;  Location: [facility];  Service: Orthopedics;  Laterality: Left;   Social History   Socioeconomic History  . Marital status: Married    Spouse name: Not on file  . Number of children: Not on file  . Years of education: Not on file  . Highest education level: Not on file  Occupational History  . Not on file  Social Needs  . Financial resource strain: Not on file  . Food insecurity:    Worry: Not on file    Inability: Not on file  . Transportation needs:    Medical: Not on file    Non-medical: Not on file  Tobacco Use  . Smoking status: Never Smoker  . Smokeless tobacco: Never Used  Substance and Sexual Activity  . Alcohol use: No  . Drug use: No  . Sexual activity: Not on file  Lifestyle  . Physical activity:    Days per  week: Not on file    Minutes per session: Not on file  . Stress: Not on file  Relationships  . Social connections:    Talks on phone: Not on file    Gets together: Not on file    Attends religious service: Not on file    Active member of club or organization: Not on file    Attends meetings of clubs or organizations: Not on file    Relationship status: Not on file  Other Topics Concern  . Not on file  Social History Narrative  . Not on file   Allergies  Allergen Reactions  . Versed [Midazolam] Other (See Comments)  . Diprivan [Propofol] Other (See Comments)    Uncontrolled shaking  . Fentanyl   . Sevoflurane   . Zofran [Ondansetron Hcl]      Past  medical history, social, surgical and family history all reviewed in electronic medical record.  No pertanent information unless stated regarding to the chief complaint.   Review of Systems:Review of systems updated and as accurate as of 09/15/17  No headache, visual changes, nausea, vomiting, diarrhea, constipation, dizziness, abdominal pain, skin rash, fevers, chills, night sweats, weight loss, swollen lymph nodes, body aches, joint swelling,  chest pain, shortness of breath, mood changes.  Positive muscle aches  Objective  Blood pressure 118/80, pulse 65, height 5\' 6"  (1.676 m), weight 135 lb (61.2 kg), SpO2 97 %. Systems examined below as of 09/15/17   General: No apparent distress alert and oriented x3 mood and affect normal, dressed appropriately.  HEENT: Pupils equal, extraocular movements intact  Respiratory: Patient's speak in full sentences and does not appear short of breath  Cardiovascular: No lower extremity edema, non tender, no erythema  Skin: Warm dry intact with no signs of infection or rash on extremities or on axial skeleton.  Abdomen: Soft nontender  Neuro: Cranial nerves II through XII are intact, neurovascularly intact in all extremities with 2+ DTRs and 2+ pulses.  Lymph: No lymphadenopathy of posterior or anterior cervical chain or axillae bilaterally.  Gait normal with good balance and coordination.  MSK:  Non tender with full range of motion and good stability and symmetric strength and tone of shoulders, elbows, wrist, hip, knee and ankles bilaterally.  Degenerative scoliosis lumbar spine noted.  Patient does have some mild limitation range of motion in all planes.  Tightness of the hamstrings bilaterally right greater than left.  Patient also has a positive Corky Sox on the right side with severe tenderness over the piriformis.  Neurovascularly intact distally with full strength.   Osteopathic findings C2 flexed rotated and side bent right T3 extended rotated and side  bent right inhaled third rib T5 extended rotated and side bent left L3 flexed rotated and side bent right Sacrum right on right  Procedure: Real-time Ultrasound Guided Injection of right piriformis tendon Device: GE Logiq Q7 Ultrasound guided injection is preferred based studies that show increased duration, increased effect, greater accuracy, decreased procedural pain, increased response rate, and decreased cost with ultrasound guided versus blind injection.  Verbal informed consent obtained.  Time-out conducted.  Noted no overlying erythema, induration, or other signs of local infection.  Skin prepped in a sterile fashion.  Local anesthesia: Topical Ethyl chloride.  With sterile technique and under real time ultrasound guidance: With a 21-gauge 2 inch needle patient was injected with a total of 1 cc of 0.5% Marcaine and 1 cc of Kenalog 40 mg/mL Completed without difficulty  Pain immediately resolved suggesting  accurate placement of the medication.  Advised to call if fevers/chills, erythema, induration, drainage, or persistent bleeding.  Images permanently stored and available for review in the ultrasound unit.  Impression: Technically successful ultrasound guided injection.    Impression and Recommendations:     This case required medical decision making of moderate complexity.      Note: This dictation was prepared with Dragon dictation along with smaller phrase technology. Any transcriptional errors that result from this process are unintentional.

## 2017-09-15 ENCOUNTER — Encounter: Payer: Self-pay | Admitting: Family Medicine

## 2017-09-15 ENCOUNTER — Ambulatory Visit: Payer: PPO | Admitting: Family Medicine

## 2017-09-15 ENCOUNTER — Ambulatory Visit: Payer: Self-pay

## 2017-09-15 VITALS — BP 118/80 | HR 65 | Ht 66.0 in | Wt 135.0 lb

## 2017-09-15 DIAGNOSIS — M48061 Spinal stenosis, lumbar region without neurogenic claudication: Secondary | ICD-10-CM

## 2017-09-15 DIAGNOSIS — M999 Biomechanical lesion, unspecified: Secondary | ICD-10-CM

## 2017-09-15 DIAGNOSIS — G5701 Lesion of sciatic nerve, right lower limb: Secondary | ICD-10-CM | POA: Diagnosis not present

## 2017-09-15 DIAGNOSIS — M25559 Pain in unspecified hip: Secondary | ICD-10-CM

## 2017-09-15 NOTE — Assessment & Plan Note (Addendum)
Decision today to treat with OMT was based on Physical Exam  After verbal consent patient was treated with HVLA, ME, FPR techniques in cervical, thoracic, rib, lumbar and sacral areas  Patient tolerated the procedure well with improvement in symptoms  Patient given exercises, stretches and lifestyle modifications  See medications in patient instructions if given  Patient will follow up in 4 weeks 

## 2017-09-15 NOTE — Patient Instructions (Signed)
God to see you  Gloria [name] is your friend Take a day or 2 from working out.  Injected the piriformis today See me again in 3-6 weeks

## 2017-09-15 NOTE — Assessment & Plan Note (Signed)
Degenerative spinal stenosis.  Patient does respond well to manipulation.  Attempted again today.  Responded fairly well.  Also attempted a piriformis injection.  Patient will continue with the medications.  Follow-up again in 3 to 4 weeks

## 2017-10-06 NOTE — Progress Notes (Signed)
[name] [name], [place] [address] Phone: [phone] Subjective:    CC: Back pain follow-up  [id number]  Gloria [name] is a 72 y.o. female coming in with complaint of back pain.  Patient does have known degenerative scoliosis.  Has been responding very well to osteopathic manipulation.  States that she has been feeling good but not doing the exercises as regularly.  Given a piriformis injection at last visit and feels like that has been helpful.     Past Medical History:  Diagnosis Date  . Anxiety   . Arthritis   . CKD (chronic kidney disease), stage III ([place])   . Complication of anesthesia 05/2014   had body shaking-observed over night-see anesthesia note-has had tremors post op in past  . Depression   . Hyperlipemia   . Hypertension   . Insomnia    Past Surgical History:  Procedure Laterality Date  . ABDOMINAL HYSTERECTOMY     age 66  . BACK SURGERY    . CARPAL TUNNEL RELEASE  01/20/2012   Procedure: CARPAL TUNNEL RELEASE;  Surgeon: Wynonia Sours, MD;  Location: [place] [facility];  Service: Orthopedics;  Laterality: Right;  . CARPAL TUNNEL RELEASE  02/25/2012   Procedure: CARPAL TUNNEL RELEASE;  Surgeon: Wynonia Sours, MD;  Location: [facility];  Service: Orthopedics;  Laterality: Left;  RELEASE A-1 PULLEY LRF  . COLONOSCOPY WITH PROPOFOL N/A 10/15/2016   Procedure: COLONOSCOPY WITH PROPOFOL;  Surgeon: Wilford Corner, MD;  Location: [facility];  Service: Endoscopy;  Laterality: N/A;  . DISTAL BICEPS TENDON REPAIR Left 06/12/2015   Procedure: REPAIR LEFT BICEPS TENDON AND DECOMPRESSION OF MEDIAN NERVE;  Surgeon: Daryll Brod, MD;  Location: [facility];  Service: Orthopedics;  Laterality: Left;  . SMALL INTESTINE SURGERY    . SMALL INTESTINE SURGERY     has had 6 total bowel obstructions -mostlt adhesions-last 2009  . TONSILLECTOMY     age 71  . TRIGGER FINGER RELEASE  2/12   rt middle    . TRIGGER FINGER RELEASE  2010   lt middle  . ULNAR NERVE TRANSPOSITION Right 05/11/2014   Procedure: DECOMPRESSION ULNAR NERVE RIGHT ELBOW ;  Surgeon: Daryll Brod, MD;  Location: [facility];  Service: Orthopedics;  Laterality: Right;  . ULNAR NERVE TRANSPOSITION Left 07/06/2014   Procedure: DECOMPRESSION  LEFT ULNAR NERVE;  Surgeon: Daryll Brod, MD;  Location: [facility];  Service: Orthopedics;  Laterality: Left;   Social History   Socioeconomic History  . Marital status: Married    Spouse name: Not on file  . Number of children: Not on file  . Years of education: Not on file  . Highest education level: Not on file  Occupational History  . Not on file  Social Needs  . Financial resource strain: Not on file  . Food insecurity:    Worry: Not on file    Inability: Not on file  . Transportation needs:    Medical: Not on file    Non-medical: Not on file  Tobacco Use  . Smoking status: Never Smoker  . Smokeless tobacco: Never Used  Substance and Sexual Activity  . Alcohol use: No  . Drug use: No  . Sexual activity: Not on file  Lifestyle  . Physical activity:    Days per week: Not on file    Minutes per session: Not on file  . Stress: Not on file  Relationships  . Social connections:    Talks on phone: Not on file    Gets together: Not on file    Attends religious service: Not on file    Active member of club or organization: Not on file    Attends meetings of clubs or organizations: Not on file    Relationship status: Not on file  Other Topics Concern  . Not on file  Social History Narrative  . Not on file   Allergies  Allergen Reactions  . Versed [Midazolam] Other (See Comments)  . Diprivan [Propofol] Other (See Comments)    Uncontrolled shaking  . Fentanyl   . Sevoflurane   . Zofran [Ondansetron Hcl]    No family history on file.   Past medical history, social, surgical and family history all reviewed in electronic medical record.   No pertanent information unless stated regarding to the chief complaint.   Review of Systems:Review of systems updated and as accurate as of 10/07/17  No headache, visual changes, nausea, vomiting, diarrhea, constipation, dizziness, abdominal pain, skin rash, fevers, chills, night sweats, weight loss, swollen lymph nodes, body aches, joint swelling,  chest pain, shortness of breath, mood changes.  Positive muscle aches  Objective  Blood pressure 140/80, pulse 75, height 5\' 6"  (1.676 m), weight 132 lb (59.9 kg), SpO2 96 %. Systems examined below as of 10/07/17   General: No apparent distress alert and oriented x3 mood and affect normal, dressed appropriately.  HEENT: Pupils equal, extraocular movements intact  Respiratory: Patient's speak in full sentences and does not appear short of breath  Cardiovascular: No lower extremity edema, non tender, no erythema  Skin: Warm dry intact with no signs of infection or rash on extremities or on axial skeleton.  Abdomen: Soft nontender  Neuro: Cranial nerves II through XII are intact, neurovascularly intact in all extremities with 2+ DTRs and 2+ pulses.  Lymph: No lymphadenopathy of posterior or anterior cervical chain or axillae bilaterally.  Gait normal with good balance and coordination.  MSK:  Non tender with full range of motion and good stability and symmetric strength and tone of shoulders, elbows, wrist, hip, knee and ankles bilaterally.  Patient's back exam does show degenerative scoliosis noted.  Negative straight leg test.  Mild tenderness over the hamstrings at the origin bilaterally.  Positive Corky Sox on the right side still noted but improved.  Osteopathic findings C2 flexed rotated and side bent right T4 extended rotated and side bent right with inhaled fourth rib L3 flexed rotated and side bent left  Sacrum right on right   Impression and Recommendations:     This case required medical decision making of moderate complexity.       Note: This dictation was prepared with Dragon dictation along with smaller phrase technology. Any transcriptional errors that result from this process are unintentional.

## 2017-10-07 ENCOUNTER — Encounter: Payer: Self-pay | Admitting: Family Medicine

## 2017-10-07 ENCOUNTER — Ambulatory Visit: Payer: PPO | Admitting: Family Medicine

## 2017-10-07 VITALS — BP 140/80 | HR 75 | Ht 66.0 in | Wt 132.0 lb

## 2017-10-07 DIAGNOSIS — M999 Biomechanical lesion, unspecified: Secondary | ICD-10-CM | POA: Diagnosis not present

## 2017-10-07 DIAGNOSIS — M48061 Spinal stenosis, lumbar region without neurogenic claudication: Secondary | ICD-10-CM

## 2017-10-07 NOTE — Assessment & Plan Note (Signed)
Decision today to treat with OMT was based on Physical Exam  After verbal consent patient was treated with HVLA, ME, FPR techniques in cervical, thoracic, rib, lumbar and sacral areas  Patient tolerated the procedure well with improvement in symptoms  Patient given exercises, stretches and lifestyle modifications  See medications in patient instructions if given  Patient will follow up in 4 weeks 

## 2017-10-07 NOTE — Assessment & Plan Note (Signed)
Patient is doing very well with conservative therapy.  Does not want any other medications if possible.  Underlying autoimmune disease can cause flares from time to time.  Patient will follow-up with me again in 4 weeks

## 2017-10-07 NOTE — Patient Instructions (Signed)
Good to see you  Ice 20 minutes 2 times daily. Usually after activity and before bed. One knee down one knee up tilt pelvis forward.  Hands overhead then rotate to upper leg.  Hold 10 seconds, relax, repeat and do other side as well.  Very important when after being in a flexed position for a long amount of time.  Stay active  See me again in 3-4 weeks

## 2017-10-26 ENCOUNTER — Other Ambulatory Visit: Payer: Self-pay | Admitting: Family Medicine

## 2017-10-26 NOTE — Telephone Encounter (Signed)
Refill done.  

## 2017-11-02 NOTE — Progress Notes (Signed)
[name] [address], [place] [address] Phone: [phone] Subjective:     CC: Back pain  VVO:[id number]  Gloria [name] is a 72 y.o. female coming in with complaint of back pain. She is taking liquid turmeric. She is taking dance lessons and does get some pain on the right side of lower back after 20 minutes. Pain subsides a few hours later.  Patient states overall seems to be doing relatively well.     Past Medical History:  Diagnosis Date  . Anxiety   . Arthritis   . CKD (chronic kidney disease), stage III ([place])   . Complication of anesthesia 05/2014   had body shaking-observed over night-see anesthesia note-has had tremors post op in past  . Depression   . Hyperlipemia   . Hypertension   . Insomnia    Past Surgical History:  Procedure Laterality Date  . ABDOMINAL HYSTERECTOMY     age 26  . BACK SURGERY    . CARPAL TUNNEL RELEASE  01/20/2012   Procedure: CARPAL TUNNEL RELEASE;  Surgeon: Wynonia Sours, MD;  Location: [facility];  Service: Orthopedics;  Laterality: Right;  . CARPAL TUNNEL RELEASE  02/25/2012   Procedure: CARPAL TUNNEL RELEASE;  Surgeon: Wynonia Sours, MD;  Location: [facility];  Service: Orthopedics;  Laterality: Left;  RELEASE A-1 PULLEY LRF  . COLONOSCOPY WITH PROPOFOL N/A 10/15/2016   Procedure: COLONOSCOPY WITH PROPOFOL;  Surgeon: Wilford Corner, MD;  Location: [facility];  Service: Endoscopy;  Laterality: N/A;  . DISTAL BICEPS TENDON REPAIR Left 06/12/2015   Procedure: REPAIR LEFT BICEPS TENDON AND DECOMPRESSION OF MEDIAN NERVE;  Surgeon: Daryll Brod, MD;  Location: [facility];  Service: Orthopedics;  Laterality: Left;  . SMALL INTESTINE SURGERY    . SMALL INTESTINE SURGERY     has had 6 total bowel obstructions -mostlt adhesions-last 2009  . TONSILLECTOMY     age 23  . TRIGGER FINGER RELEASE  2/12   rt middle  . TRIGGER FINGER RELEASE  2010   lt middle  . ULNAR  NERVE TRANSPOSITION Right 05/11/2014   Procedure: DECOMPRESSION ULNAR NERVE RIGHT ELBOW ;  Surgeon: Daryll Brod, MD;  Location: [facility];  Service: Orthopedics;  Laterality: Right;  . ULNAR NERVE TRANSPOSITION Left 07/06/2014   Procedure: DECOMPRESSION  LEFT ULNAR NERVE;  Surgeon: Daryll Brod, MD;  Location: [place];  Service: Orthopedics;  Laterality: Left;   Social History   Socioeconomic History  . Marital status: Married    Spouse name: Not on file  . Number of children: Not on file  . Years of education: Not on file  . Highest education level: Not on file  Occupational History  . Not on file  Social Needs  . Financial resource strain: Not on file  . Food insecurity:    Worry: Not on file    Inability: Not on file  . Transportation needs:    Medical: Not on file    Non-medical: Not on file  Tobacco Use  . Smoking status: Never Smoker  . Smokeless tobacco: Never Used  Substance and Sexual Activity  . Alcohol use: No  . Drug use: No  . Sexual activity: Not on file  Lifestyle  . Physical activity:    Days per week: Not on file    Minutes per session: Not on file  . Stress: Not on file  Relationships  . Social connections:  Talks on phone: Not on file    Gets together: Not on file    Attends religious service: Not on file    Active member of club or organization: Not on file    Attends meetings of clubs or organizations: Not on file    Relationship status: Not on file  Other Topics Concern  . Not on file  Social History Narrative  . Not on file   Allergies  Allergen Reactions  . Versed [Midazolam] Other (See Comments)  . Diprivan [Propofol] Other (See Comments)    Uncontrolled shaking  . Fentanyl   . Sevoflurane   . Zofran [Ondansetron Hcl]    No family history on file.  No family history of autoimmune   Past medical history, social, surgical and family history all reviewed in electronic medical record.  No pertanent  information unless stated regarding to the chief complaint.   Review of Systems:Review of systems updated and as accurate as of 11/03/17  No headache, visual changes, nausea, vomiting, diarrhea, constipation, dizziness, abdominal pain, skin rash, fevers, chills, night sweats, weight loss, swollen lymph nodes, body aches, joint swelling,  chest pain, shortness of breath, mood changes.  Positive muscle aches  Objective  Blood pressure 132/76, pulse 72, height 5\' 6"  (1.676 m), weight 128 lb (58.1 kg), SpO2 97 %. Systems examined below as of 11/03/17   General: No apparent distress alert and oriented x3 mood and affect normal, dressed appropriately.  HEENT: Pupils equal, extraocular movements intact  Respiratory: Patient's speak in full sentences and does not appear short of breath  Cardiovascular: No lower extremity edema, non tender, no erythema  Skin: Warm dry intact with no signs of infection or rash on extremities or on axial skeleton.  Abdomen: Soft nontender  Neuro: Cranial nerves II through XII are intact, neurovascularly intact in all extremities with 2+ DTRs and 2+ pulses.  Lymph: No lymphadenopathy of posterior or anterior cervical chain or axillae bilaterally.  Gait normal with good balance and coordination.  MSK:  Non tender with full range of motion and good stability and symmetric strength and tone of shoulders, elbows, wrist, hip, knee and ankles bilaterally.  Patient back exam does have some degenerative scoliosis noted of the lumbar spine.  Patient does have some tightness of the sacroiliac joint bilaterally.  Positive [place] on the right side.  Negative straight leg test.  Osteopathic findings C2 flexed rotated and side bent right C6 flexed rotated and side bent left T3 extended rotated and side bent right inhaled third rib L2 flexed rotated and side bent right Sacrum right on right     Impression and Recommendations:     This case required medical decision making of  moderate complexity.      Note: This dictation was prepared with Dragon dictation along with smaller phrase technology. Any transcriptional errors that result from this process are unintentional.

## 2017-11-03 ENCOUNTER — Encounter: Payer: Self-pay | Admitting: Family Medicine

## 2017-11-03 ENCOUNTER — Ambulatory Visit: Payer: PPO | Admitting: Family Medicine

## 2017-11-03 VITALS — BP 132/76 | HR 72 | Ht 66.0 in | Wt 128.0 lb

## 2017-11-03 DIAGNOSIS — M999 Biomechanical lesion, unspecified: Secondary | ICD-10-CM

## 2017-11-03 DIAGNOSIS — M48061 Spinal stenosis, lumbar region without neurogenic claudication: Secondary | ICD-10-CM

## 2017-11-03 NOTE — Assessment & Plan Note (Signed)
Discussed with patient in great length.  Doing relatively well.  Can space out to 6-week intervals.  Continue the gabapentin continue home exercises and staying active.

## 2017-11-03 NOTE — Assessment & Plan Note (Signed)
Decision today to treat with OMT was based on Physical Exam  After verbal consent patient was treated with HVLA, ME, FPR techniques in cervical, thoracic, rib, lumbar and sacral areas  Patient tolerated the procedure well with improvement in symptoms  Patient given exercises, stretches and lifestyle modifications  See medications in patient instructions if given  Patient will follow up in 6 weeks 

## 2017-11-03 NOTE — Patient Instructions (Signed)
Good to see you  Ice is your friend Stay active  I am proud of you  See me again in 4-5 weeks

## 2017-11-04 DIAGNOSIS — M150 Primary generalized (osteo)arthritis: Secondary | ICD-10-CM | POA: Diagnosis not present

## 2017-11-04 DIAGNOSIS — Z6820 Body mass index (BMI) 20.0-20.9, adult: Secondary | ICD-10-CM | POA: Diagnosis not present

## 2017-11-04 DIAGNOSIS — M65331 Trigger finger, right middle finger: Secondary | ICD-10-CM | POA: Diagnosis not present

## 2017-11-04 DIAGNOSIS — Z79899 Other long term (current) drug therapy: Secondary | ICD-10-CM | POA: Diagnosis not present

## 2017-11-04 DIAGNOSIS — M0609 Rheumatoid arthritis without rheumatoid factor, multiple sites: Secondary | ICD-10-CM | POA: Diagnosis not present

## 2017-11-18 DIAGNOSIS — Z79899 Other long term (current) drug therapy: Secondary | ICD-10-CM | POA: Diagnosis not present

## 2017-11-18 DIAGNOSIS — H16223 Keratoconjunctivitis sicca, not specified as Sjogren's, bilateral: Secondary | ICD-10-CM | POA: Diagnosis not present

## 2017-11-30 NOTE — Progress Notes (Signed)
[name] [address], [place] [address] Phone: [phone] Subjective:     CC: Back pain  [id number]  Glor[name] is a 72 y.o. female coming in with complaint of back pain. She has some pain in the hips after dancing for 40 minutes, twice a week. Low back pain unchanged. She also notes a catching in her knee with dancing. Does not stretch following activity.   Patient states nothing severe enough that stops her from activity.  Sometimes with sharp pain and very minimal radicular symptoms.     Past Medical History:  Diagnosis Date  . Anxiety   . Arthritis   . CKD (chronic kidney disease), stage III ([place])   . Complication of anesthesia 05/2014   had body shaking-observed over night-see anesthesia note-has had tremors post op in past  . Depression   . Hyperlipemia   . Hypertension   . Insomnia    Past Surgical History:  Procedure Laterality Date  . ABDOMINAL HYSTERECTOMY     age 25  . BACK SURGERY    . CARPAL TUNNEL RELEASE  01/20/2012   Procedure: CARPAL TUNNEL RELEASE;  Surgeon: Wynonia Sours, MD;  Location: [facility];  Service: Orthopedics;  Laterality: Right;  . CARPAL TUNNEL RELEASE  02/25/2012   Procedure: CARPAL TUNNEL RELEASE;  Surgeon: Wynonia Sours, MD;  Location: [name];  Service: Orthopedics;  Laterality: Left;  RELEASE A-1 PULLEY LRF  . COLONOSCOPY WITH PROPOFOL N/A 10/15/2016   Procedure: COLONOSCOPY WITH PROPOFOL;  Surgeon: Wilford Corner, MD;  Location: [facility];  Service: Endoscopy;  Laterality: N/A;  . DISTAL BICEPS TENDON REPAIR Left 06/12/2015   Procedure: REPAIR LEFT BICEPS TENDON AND DECOMPRESSION OF MEDIAN NERVE;  Surgeon: Daryll Brod, MD;  Location: [facility];  Service: Orthopedics;  Laterality: Left;  . SMALL INTESTINE SURGERY    . SMALL INTESTINE SURGERY     has had 6 total bowel obstructions -mostlt adhesions-last 2009  . TONSILLECTOMY     age 81  .  TRIGGER FINGER RELEASE  2/12   rt middle  . TRIGGER FINGER RELEASE  2010   lt middle  . ULNAR NERVE TRANSPOSITION Right 05/11/2014   Procedure: DECOMPRESSION ULNAR NERVE RIGHT ELBOW ;  Surgeon: Daryll Brod, MD;  Location: [facility];  Service: Orthopedics;  Laterality: Right;  . ULNAR NERVE TRANSPOSITION Left 07/06/2014   Procedure: DECOMPRESSION  LEFT ULNAR NERVE;  Surgeon: Daryll Brod, MD;  Location: [facility];  Service: Orthopedics;  Laterality: Left;   Social History   Socioeconomic History  . Marital status: Married    Spouse name: Not on file  . Number of children: Not on file  . Years of education: Not on file  . Highest education level: Not on file  Occupational History  . Not on file  Social Needs  . Financial resource strain: Not on file  . Food insecurity:    Worry: Not on file    Inability: Not on file  . Transportation needs:    Medical: Not on file    Non-medical: Not on file  Tobacco Use  . Smoking status: Never Smoker  . Smokeless tobacco: Never Used  Substance and Sexual Activity  . Alcohol use: No  . Drug use: No  . Sexual activity: Not on file  Lifestyle  . Physical activity:    Days per week: Not on file    Minutes per session:  Not on file  . Stress: Not on file  Relationships  . Social connections:    Talks on phone: Not on file    Gets together: Not on file    Attends religious service: Not on file    Active member of club or organization: Not on file    Attends meetings of clubs or organizations: Not on file    Relationship status: Not on file  Other Topics Concern  . Not on file  Social History Narrative  . Not on file   Allergies  Allergen Reactions  . Versed [Midazolam] Other (See Comments)  . Diprivan [Propofol] Other (See Comments)    Uncontrolled shaking  . Fentanyl   . Sevoflurane   . Zofran [Ondansetron Hcl]    No family history on file.  No family history of autoimmune   Past medical history,  social, surgical and family history all reviewed in electronic medical record.  No pertanent information unless stated regarding to the chief complaint.   Review of Systems:Review of systems updated and as accurate as of 12/01/17  No headache, visual changes, nausea, vomiting, diarrhea, constipation, dizziness, abdominal pain, skin rash, fevers, chills, night sweats, weight loss, swollen lymph nodes, body aches, joint swelling, chest pain, shortness of breath, mood changes.  Positive muscle aches  Objective  Blood pressure 130/88, pulse 79, height 5\' 6"  (1.676 m), weight 129 lb (58.5 kg), SpO2 97 %. Systems examined below as of 12/01/17   General: No apparent distress alert and oriented x3 mood and affect normal, dressed appropriately.  HEENT: Pupils equal, extraocular movements intact  Respiratory: Patient's speak in full sentences and does not appear short of breath  Cardiovascular: No lower extremity edema, non tender, no erythema  Skin: Warm dry intact with no signs of infection or rash on extremities or on axial skeleton.  Abdomen: Soft nontender  Neuro: Cranial nerves II through XII are intact, neurovascularly intact in all extremities with 2+ DTRs and 2+ pulses.  Lymph: No lymphadenopathy of posterior or anterior cervical chain or axillae bilaterally.  Gait normal with good balance and coordination.  MSK:  Non tender with full range of motion and good stability and symmetric strength and tone of shoulders, elbows, wrist, hip, knee and ankles bilaterally.  Mild to moderate arthritic changes Back exam shows some degenerative scoliosis.  Mild loss of lordosis.  Tightness of the [employer].  Negative straight leg test.  Tenderness to palpation of the paraspinal musculature on the right side  Osteopathic findings C2 flexed rotated and side bent right C4 flexed rotated and side bent left T3 extended rotated and side bent right inhaled third rib T6 extended rotated and side bent left L4 flexed  rotated and side bent right Sacrum right on right     Impression and Recommendations:     This case required medical decision making of moderate complexity.      Note: This dictation was prepared with Dragon dictation along with smaller phrase technology. Any transcriptional errors that result from this process are unintentional.

## 2017-12-01 ENCOUNTER — Encounter: Payer: Self-pay | Admitting: Family Medicine

## 2017-12-01 ENCOUNTER — Ambulatory Visit: Payer: PPO | Admitting: Family Medicine

## 2017-12-01 VITALS — BP 130/88 | HR 79 | Ht 66.0 in | Wt 129.0 lb

## 2017-12-01 DIAGNOSIS — M48061 Spinal stenosis, lumbar region without neurogenic claudication: Secondary | ICD-10-CM | POA: Diagnosis not present

## 2017-12-01 DIAGNOSIS — M999 Biomechanical lesion, unspecified: Secondary | ICD-10-CM | POA: Diagnosis not present

## 2017-12-01 NOTE — Assessment & Plan Note (Signed)
Decision today to treat with OMT was based on Physical Exam  After verbal consent patient was treated with HVLA, ME, FPR techniques in cervical, thoracic, rib, lumbar and sacral areas  Patient tolerated the procedure well with improvement in symptoms  Patient given exercises, stretches and lifestyle modifications  See medications in patient instructions if given  Patient will follow up in 5-6 weeks

## 2017-12-01 NOTE — Assessment & Plan Note (Signed)
Spinal stenosis.  Minimal radicular symptoms.  Taking gabapentin intermittently.  Doing very well.  Discussed once weekly vitamin D.  Discussed posture and ergonomics.  Discussed which activities to do which wants to avoid.  Responding well to manipulation.  Follow-up again in 5 to 6 weeks

## 2017-12-01 NOTE — Patient Instructions (Signed)
Good to see you  Overall I am impressed New knee exercises will be great  See em again in 5 weeks!

## 2017-12-02 DIAGNOSIS — R52 Pain, unspecified: Secondary | ICD-10-CM | POA: Diagnosis not present

## 2017-12-02 DIAGNOSIS — M65331 Trigger finger, right middle finger: Secondary | ICD-10-CM | POA: Diagnosis not present

## 2017-12-30 DIAGNOSIS — F325 Major depressive disorder, single episode, in full remission: Secondary | ICD-10-CM | POA: Diagnosis not present

## 2017-12-30 DIAGNOSIS — E559 Vitamin D deficiency, unspecified: Secondary | ICD-10-CM | POA: Diagnosis not present

## 2017-12-30 DIAGNOSIS — R634 Abnormal weight loss: Secondary | ICD-10-CM | POA: Diagnosis not present

## 2017-12-30 DIAGNOSIS — M069 Rheumatoid arthritis, unspecified: Secondary | ICD-10-CM | POA: Diagnosis not present

## 2017-12-30 DIAGNOSIS — E7800 Pure hypercholesterolemia, unspecified: Secondary | ICD-10-CM | POA: Diagnosis not present

## 2017-12-30 DIAGNOSIS — I10 Essential (primary) hypertension: Secondary | ICD-10-CM | POA: Diagnosis not present

## 2017-12-30 DIAGNOSIS — Z0000 Encounter for general adult medical examination without abnormal findings: Secondary | ICD-10-CM | POA: Diagnosis not present

## 2018-01-03 NOTE — Progress Notes (Signed)
[name] [address] [place] [place], [place] [address] Phone: [phone] Subjective:    I Gloria [name] am serving as a Education administrator for Dr. Hulan Saas.  CC: Back pain  ALP:[id number]  Gloria [name] is a 72 y.o. female coming in with complaint of back pain. States the back is better than last visit. Known degenerative scoliosis.  Has had some spinal stenosis as well.  Discussed with patient about icing regimen and home exercise.  Patient has been doing it fairly frequently.  Has had some weight loss recently.  I am concerned about it though.  Try to stay active.     Past Medical History:  Diagnosis Date  . Anxiety   . Arthritis   . CKD (chronic kidney disease), stage III ([place])   . Complication of anesthesia 05/2014   had body shaking-observed over night-see anesthesia note-has had tremors post op in past  . Depression   . Hyperlipemia   . Hypertension   . Insomnia    Past Surgical History:  Procedure Laterality Date  . ABDOMINAL HYSTERECTOMY     age 78  . BACK SURGERY    . CARPAL TUNNEL RELEASE  01/20/2012   Procedure: CARPAL TUNNEL RELEASE;  Surgeon: Wynonia Sours, MD;  Location: [facility];  Service: Orthopedics;  Laterality: Right;  . CARPAL TUNNEL RELEASE  02/25/2012   Procedure: CARPAL TUNNEL RELEASE;  Surgeon: Wynonia Sours, MD;  Location: [facility];  Service: Orthopedics;  Laterality: Left;  RELEASE A-1 PULLEY LRF  . COLONOSCOPY WITH PROPOFOL N/A 10/15/2016   Procedure: COLONOSCOPY WITH PROPOFOL;  Surgeon: Wilford Corner, MD;  Location: [facility];  Service: Endoscopy;  Laterality: N/A;  . DISTAL BICEPS TENDON REPAIR Left 06/12/2015   Procedure: REPAIR LEFT BICEPS TENDON AND DECOMPRESSION OF MEDIAN NERVE;  Surgeon: Daryll Brod, MD;  Location: [facility];  Service: Orthopedics;  Laterality: Left;  . SMALL INTESTINE SURGERY    . SMALL INTESTINE SURGERY     has had 6 total bowel obstructions -mostlt  adhesions-last 2009  . TONSILLECTOMY     age 50  . TRIGGER FINGER RELEASE  2/12   rt middle  . TRIGGER FINGER RELEASE  2010   lt middle  . ULNAR NERVE TRANSPOSITION Right 05/11/2014   Procedure: DECOMPRESSION ULNAR NERVE RIGHT ELBOW ;  Surgeon: Daryll Brod, MD;  Location: [facility];  Service: Orthopedics;  Laterality: Right;  . ULNAR NERVE TRANSPOSITION Left 07/06/2014   Procedure: DECOMPRESSION  LEFT ULNAR NERVE;  Surgeon: Daryll Brod, MD;  Location: [facility];  Service: Orthopedics;  Laterality: Left;   Social History   Socioeconomic History  . Marital status: Married    Spouse name: Not on file  . Number of children: Not on file  . Years of education: Not on file  . Highest education level: Not on file  Occupational History  . Not on file  Social Needs  . Financial resource strain: Not on file  . Food insecurity:    Worry: Not on file    Inability: Not on file  . Transportation needs:    Medical: Not on file    Non-medical: Not on file  Tobacco Use  . Smoking status: Never Smoker  . Smokeless tobacco: Never Used  Substance and Sexual Activity  . Alcohol use: No  . Drug use: No  . Sexual activity: Not on file  Lifestyle  . Physical activity:  Days per week: Not on file    Minutes per session: Not on file  . Stress: Not on file  Relationships  . Social connections:    Talks on phone: Not on file    Gets together: Not on file    Attends religious service: Not on file    Active member of club or organization: Not on file    Attends meetings of clubs or organizations: Not on file    Relationship status: Not on file  Other Topics Concern  . Not on file  Social History Narrative  . Not on file   Allergies  Allergen Reactions  . Versed [Midazolam] Other (See Comments)  . Diprivan [Propofol] Other (See Comments)    Uncontrolled shaking  . Fentanyl   . Sevoflurane   . Zofran [Ondansetron Hcl]    No family history on file.  No family  history of autoimmune  Current Outpatient Medications (Endocrine & Metabolic):  .  alendronate (FOSAMAX) 10 MG tablet, Take 10 mg by mouth daily before breakfast. Take with a full glass of water on an empty stomach.    Current Outpatient Medications (Analgesics):  .  aspirin 81 MG tablet, Take 81 mg by mouth daily.   Current Outpatient Medications (Other):  .  cholecalciferol (VITAMIN D) 1000 UNITS tablet, Take 1,000 Units by mouth daily. Marland Kitchen  ESTRACE VAGINAL 0.1 MG/GM vaginal cream,  .  gabapentin (NEURONTIN) 100 MG capsule, TAKE 2 CAPSULES BY MOUTH AT BEDTIME .  hydroxychloroquine (PLAQUENIL) 200 MG tablet, Take 200 mg by mouth daily. .  potassium chloride (K-DUR,KLOR-CON) 10 MEQ tablet, Take 10 mEq by mouth daily. .  traZODone (DESYREL) 50 MG tablet, Take 150 mg by mouth at bedtime. .  Vitamin D, Ergocalciferol, (DRISDOL) 50000 units CAPS capsule, TAKE ONE CAPSULE BY MOUTH ONCE A WEEK    Past medical history, social, surgical and family history all reviewed in electronic medical record.  No pertanent information unless stated regarding to the chief complaint.   Review of Systems:  No headache, visual changes, nausea, vomiting, diarrhea, constipation, dizziness, abdominal pain, skin rash, fevers, chills, night sweats, weight loss, swollen lymph nodes, body aches, joint swelling,  chest pain, shortness of breath, mood changes.  Positive muscle aches  Objective  Blood pressure (!) 154/84, pulse 74, height 5\' 6"  (1.676 m), weight 129 lb (58.5 kg), SpO2 98 %.    General: No apparent distress alert and oriented x3 mood and affect normal, dressed appropriately.  HEENT: Pupils equal, extraocular movements intact  Respiratory: Patient's speak in full sentences and does not appear short of breath  Cardiovascular: No lower extremity edema, non tender, no erythema  Skin: Warm dry intact with no signs of infection or rash on extremities or on axial skeleton.  Abdomen: Soft nontender  Neuro:  Cranial nerves II through XII are intact, neurovascularly intact in all extremities with 2+ DTRs and 2+ pulses.  Lymph: No lymphadenopathy of posterior or anterior cervical chain or axillae bilaterally.  Gait normal with good balance and coordination.  MSK:  Non tender with full range of motion and good stability and symmetric strength and tone of shoulders, elbows, wrist, hip, knee and ankles bilaterally.  Mild arthritic changes of multiple joints Back exam shows some loss of lordosis with significant degenerative scoliosis.  Patient does have some tightness of the right femur.  Some mild tenderness over the left piriformis which is new as well.  Negative straight leg test bilaterally.  Mild tightness of the hamstrings bilaterally.  Deep tendon reflexes intact.  Osteopathic findings  C2 flexed rotated and side bent right C4 flexed rotated and side bent left T3 extended rotated and side bent right inhaled third rib T6 extended rotated and side bent left L2 flexed rotated and side bent right Sacrum right on right      Impression and Recommendations:     This case required medical decision making of moderate complexity. The above documentation has been reviewed and is accurate and complete Lyndal Pulley, DO       Note: This dictation was prepared with Dragon dictation along with smaller phrase technology. Any transcriptional errors that result from this process are unintentional.

## 2018-01-05 ENCOUNTER — Encounter: Payer: Self-pay | Admitting: Family Medicine

## 2018-01-05 ENCOUNTER — Ambulatory Visit: Payer: PPO | Admitting: Family Medicine

## 2018-01-05 VITALS — BP 154/84 | HR 74 | Ht 66.0 in | Wt 129.0 lb

## 2018-01-05 DIAGNOSIS — M999 Biomechanical lesion, unspecified: Secondary | ICD-10-CM | POA: Diagnosis not present

## 2018-01-05 DIAGNOSIS — M48061 Spinal stenosis, lumbar region without neurogenic claudication: Secondary | ICD-10-CM | POA: Diagnosis not present

## 2018-01-05 NOTE — Patient Instructions (Signed)
Good to see you  Ice is your friend Stay active We will watch the weight a little  See me again in 4-6 weeks

## 2018-01-05 NOTE — Assessment & Plan Note (Signed)
Significant spinal stenosis.  Discussed icing regimen and home exercises.  Discussed which activities to do which wants to avoid.  Patient is to increase activity slowly over the course the next several days.  Follow-up with me again in 4 to 8 weeks

## 2018-01-05 NOTE — Assessment & Plan Note (Signed)
Decision today to treat with OMT was based on Physical Exam  After verbal consent patient was treated with HVLA, ME, FPR techniques in cervical, thoracic, rib,  lumbar and sacral areas  Patient tolerated the procedure well with improvement in symptoms  Patient given exercises, stretches and lifestyle modifications  See medications in patient instructions if given  Patient will follow up in 4-8 weeks 

## 2018-01-13 DIAGNOSIS — M65331 Trigger finger, right middle finger: Secondary | ICD-10-CM | POA: Diagnosis not present

## 2018-01-15 ENCOUNTER — Other Ambulatory Visit: Payer: Self-pay | Admitting: Family Medicine

## 2018-01-15 NOTE — Telephone Encounter (Signed)
Refill done.  

## 2018-01-25 DIAGNOSIS — Z6820 Body mass index (BMI) 20.0-20.9, adult: Secondary | ICD-10-CM | POA: Diagnosis not present

## 2018-01-25 DIAGNOSIS — Z1231 Encounter for screening mammogram for malignant neoplasm of breast: Secondary | ICD-10-CM | POA: Diagnosis not present

## 2018-01-25 DIAGNOSIS — Z01419 Encounter for gynecological examination (general) (routine) without abnormal findings: Secondary | ICD-10-CM | POA: Diagnosis not present

## 2018-01-26 ENCOUNTER — Other Ambulatory Visit: Payer: Self-pay

## 2018-01-26 ENCOUNTER — Encounter (HOSPITAL_COMMUNITY): Payer: Self-pay

## 2018-01-26 ENCOUNTER — Emergency Department (HOSPITAL_COMMUNITY): Payer: PPO

## 2018-01-26 ENCOUNTER — Emergency Department (HOSPITAL_COMMUNITY)
Admission: EM | Admit: 2018-01-26 | Discharge: 2018-01-26 | Disposition: A | Discharge status: Home / Self Care | Payer: PPO | Attending: Emergency Medicine | Admitting: Emergency Medicine

## 2018-01-26 DIAGNOSIS — M069 Rheumatoid arthritis, unspecified: Secondary | ICD-10-CM | POA: Diagnosis not present

## 2018-01-26 DIAGNOSIS — R7889 Finding of other specified substances, not normally found in blood: Secondary | ICD-10-CM | POA: Diagnosis present

## 2018-01-26 DIAGNOSIS — I700 Atherosclerosis of aorta: Secondary | ICD-10-CM | POA: Diagnosis not present

## 2018-01-26 DIAGNOSIS — N183 Chronic kidney disease, stage 3 (moderate): Secondary | ICD-10-CM | POA: Diagnosis not present

## 2018-01-26 DIAGNOSIS — R079 Chest pain, unspecified: Secondary | ICD-10-CM | POA: Diagnosis not present

## 2018-01-26 DIAGNOSIS — R0789 Other chest pain: Secondary | ICD-10-CM | POA: Diagnosis not present

## 2018-01-26 DIAGNOSIS — I129 Hypertensive chronic kidney disease with stage 1 through stage 4 chronic kidney disease, or unspecified chronic kidney disease: Secondary | ICD-10-CM | POA: Insufficient documentation

## 2018-01-26 DIAGNOSIS — R5381 Other malaise: Secondary | ICD-10-CM | POA: Diagnosis not present

## 2018-01-26 DIAGNOSIS — R7989 Other specified abnormal findings of blood chemistry: Secondary | ICD-10-CM | POA: Diagnosis not present

## 2018-01-26 DIAGNOSIS — Z79899 Other long term (current) drug therapy: Secondary | ICD-10-CM | POA: Diagnosis not present

## 2018-01-26 LAB — BASIC METABOLIC PANEL
ANION GAP: 10 (ref 5–15)
BUN: 20 mg/dL (ref 8–23)
CO2: 28 mmol/L (ref 22–32)
Calcium: 9.3 mg/dL (ref 8.9–10.3)
Chloride: 105 mmol/L (ref 98–111)
Creatinine, Ser: 0.93 mg/dL (ref 0.44–1.00)
GFR calc Af Amer: >60 mL/min (ref >60)
GFR, EST NON AFRICAN AMERICAN: 60 mL/min — AB (ref >60)
GLUCOSE: 94 mg/dL (ref 70–99)
POTASSIUM: 3.4 mmol/L — AB (ref 3.5–5.1)
Sodium: 143 mmol/L (ref 135–145)

## 2018-01-26 LAB — CBC
HEMATOCRIT: 38.9 % (ref 36.0–46.0)
HEMOGLOBIN: 13.4 g/dL (ref 12.0–15.0)
MCH: 30.5 pg (ref 26.0–34.0)
MCHC: 34.4 g/dL (ref 30.0–36.0)
MCV: 88.4 fL (ref 78.0–100.0)
Platelets: 274 10*3/uL (ref 150–400)
RBC: 4.4 MIL/uL (ref 3.87–5.11)
RDW: 13.1 % (ref 11.5–15.5)
WBC: 4.8 10*3/uL (ref 4.0–10.5)

## 2018-01-26 LAB — BRAIN NATRIURETIC PEPTIDE: B NATRIURETIC PEPTIDE 5: 54.7 pg/mL (ref 0.0–100.0)

## 2018-01-26 LAB — I-STAT TROPONIN, ED: Troponin i, poc: 0.03 ng/mL (ref 0.00–0.08)

## 2018-01-26 MED ORDER — IOPAMIDOL (ISOVUE-370) INJECTION 76%
100.0000 mL | Freq: Once | INTRAVENOUS | Status: AC | PRN
  Administered 2018-01-26: 100 mL via INTRAVENOUS

## 2018-01-26 MED ORDER — IOPAMIDOL (ISOVUE-370) INJECTION 76%
INTRAVENOUS | Status: AC
  Filled 2018-01-26: qty 100

## 2018-01-26 NOTE — ED Triage Notes (Signed)
Patient was called by PCP and was told she had an elevated d-dimer. Patient was told to come to the ED to rule out DVT.

## 2018-01-26 NOTE — ED Provider Notes (Signed)
[facility] DEPT Provider Note   CSN: [id number] Arrival date & time: 01/26/18  1810     History   Chief Complaint Chief Complaint  Patient presents with  . Abnormal Lab    HPI Gloria [name] is a 72 y.o. female with a history of hypertension and hyperlipidemia who presents emergency department today for abnormal labs.  Patient reports that on 01/17/2018 she was sitting in church when she had the onset of diffuse chest pain that radiated into her left shoulder she describes as pleuritic and hard to catch her breath.  She reports that this lasted for several days and she had to use in bed as the chest pain was so severe.  She denies any medications to help her with her symptoms.  She notes that the chest pain was nonexertional in nature.  She denies any associated fever, chills, cough, hemoptysis or lower extremity swelling.  She reports the symptoms resolved but then recurred on Saturday, 01/23/2018 while the patient was driving.  She reports that it was the same pain as prior.  She notes she took a sleeping pill at night and when she woke on Sunday the pain resolved.  She denies any chest pain or shortness of breath since that time.  She saw her PCP and was noted to have an elevated d-dimer and was sent over for further evaluation.  Patient denies any history of prior MI/CVA.  She denies any radiation of the pain to her neck, jaw, back.  There is no associated nausea, emesis or diaphoresis.  The pain was never exertional in nature.  She reports she is a never smoker.  She has had a family history of heart disease including her brother and father.  She denies any chest trauma.  She denies any exogenous estrogen use, recent surgery/trauma/immobilization, history of DVT/PE, lower extremity swelling/pain, or history of cancer.  She is currently asymptomatic and has no complaints at this time. No hx of COPD, asthma or CHF. Patient reports she is usually able to take ballroom  dancing classes for approximately 40 minutes without any chest pain or shortness of breath.  No blood thinner use.  HPI  Past Medical History:  Diagnosis Date  . Anxiety   . Arthritis   . CKD (chronic kidney disease), stage III ([place])   . Complication of anesthesia 05/2014   had body shaking-observed over night-see anesthesia note-has had tremors post op in past  . Depression   . Hyperlipemia   . Hypertension   . Insomnia     Patient Active Problem List   Diagnosis Date Noted  . Nonallopathic lesion of cervical region 01/05/2018  . Nonallopathic lesion of rib cage 04/30/2017  . Nonallopathic lesion of thoracic region 04/30/2017  . Nonallopathic lesion of lumbosacral region 04/30/2017  . Nonallopathic lesion of sacral region 04/30/2017  . Degenerative lumbar spinal stenosis 03/10/2017  . Personal history of colonic polyps 10/15/2016  . Cervical spondylosis 10/13/2016  . Depression 10/13/2016  . High blood pressure 10/13/2016  . Nervousness 10/13/2016  . Rheumatoid arthritis ([place]) 10/13/2016  . Sleep disorder 10/13/2016  . Complete tear of right rotator cuff 05/16/2016  . Status post right rotator cuff repair 03/04/2016  . Lesion of left median nerve at forearm 07/16/2015  . Nontraumatic rupture of left bicep tendon 05/09/2015  . Trigger index finger of right hand 05/09/2015  . Other synovitis and tenosynovitis, left forearm 04/25/2015  . Benign neoplasm of soft tissues of left upper extremity 04/09/2015  .  Neoplasm of unspecified behavior of bone, soft tissue, and skin 04/09/2015  . Right lateral epicondylitis 04/09/2015  . Ulnar nerve compression 05/11/2014  . Pseudophakia 06/23/2012  . After-cataract 05/30/2011  . Bilateral dry eyes 05/30/2011    Past Surgical History:  Procedure Laterality Date  . ABDOMINAL HYSTERECTOMY     age 80  . BACK SURGERY    . CARPAL TUNNEL RELEASE  01/20/2012   Procedure: CARPAL TUNNEL RELEASE;  Surgeon: Wynonia Sours, MD;  Location: [facility];  Service: Orthopedics;  Laterality: Right;  . CARPAL TUNNEL RELEASE  02/25/2012   Procedure: CARPAL TUNNEL RELEASE;  Surgeon: Wynonia Sours, MD;  Location: [facility];  Service: Orthopedics;  Laterality: Left;  RELEASE A-1 PULLEY LRF  . COLONOSCOPY WITH PROPOFOL N/A 10/15/2016   Procedure: COLONOSCOPY WITH PROPOFOL;  Surgeon: Wilford Corner, MD;  Location: [facility];  Service: Endoscopy;  Laterality: N/A;  . DISTAL BICEPS TENDON REPAIR Left 06/12/2015   Procedure: REPAIR LEFT BICEPS TENDON AND DECOMPRESSION OF MEDIAN NERVE;  Surgeon: Daryll Brod, MD;  Location: [facility];  Service: Orthopedics;  Laterality: Left;  . SMALL INTESTINE SURGERY    . SMALL INTESTINE SURGERY     has had 6 total bowel obstructions -mostlt adhesions-last 2009  . TONSILLECTOMY     age 89  . TRIGGER FINGER RELEASE  2/12   rt middle  . TRIGGER FINGER RELEASE  2010   lt middle  . ULNAR NERVE TRANSPOSITION Right 05/11/2014   Procedure: DECOMPRESSION ULNAR NERVE RIGHT ELBOW ;  Surgeon: Daryll Brod, MD;  Location: [facility];  Service: Orthopedics;  Laterality: Right;  . ULNAR NERVE TRANSPOSITION Left 07/06/2014   Procedure: DECOMPRESSION  LEFT ULNAR NERVE;  Surgeon: Daryll Brod, MD;  Location: [facility];  Service: Orthopedics;  Laterality: Left;     OB History   None      Home Medications    Prior to Admission medications   Medication Sig Start Date End Date Taking? Authorizing Provider  alendronate (FOSAMAX) 10 MG tablet Take 10 mg by mouth daily before breakfast. Take with a full glass of water on an empty stomach.    [provider]  aspirin 81 MG tablet Take 81 mg by mouth daily.    [provider]  cholecalciferol (VITAMIN D) 1000 UNITS tablet Take 1,000 Units by mouth daily.    [provider]  ESTRACE VAGINAL 0.1 MG/GM vaginal cream  02/21/16   [provider]  gabapentin (NEURONTIN) 100 MG  capsule TAKE 2 CAPSULES BY MOUTH AT BEDTIME 09/02/17   Lyndal Pulley, DO  hydroxychloroquine (PLAQUENIL) 200 MG tablet Take 200 mg by mouth daily.    [provider]  potassium chloride (K-DUR,KLOR-CON) 10 MEQ tablet Take 10 mEq by mouth daily.    [provider]  traZODone (DESYREL) 50 MG tablet Take 150 mg by mouth at bedtime.    [provider]  Vitamin D, Ergocalciferol, (DRISDOL) 50000 units CAPS capsule TAKE ONE CAPSULE BY MOUTH ONCE A WEEK 01/15/18   Lyndal Pulley, DO    Family History Family History  Problem Relation Age of Onset  . Heart failure Father     Social History Social History   Tobacco Use  . Smoking status: Never Smoker  . Smokeless tobacco: Never Used  Substance Use Topics  . Alcohol use: No  . Drug use: No     Allergies   Versed [midazolam]; Diprivan [propofol];  Fentanyl; Sevoflurane; and Zofran [ondansetron hcl]   Review of Systems Review of Systems  All other systems reviewed and are negative.    Physical Exam Updated Vital Signs BP (!) 147/79 (BP Location: Left Arm)   Pulse 96   Temp 98.1 F (36.7 C) (Oral)   Resp 16   Ht 5\' 6"  (1.676 m)   Wt 58.1 kg   SpO2 98%   BMI 20.67 kg/m   Physical Exam  Constitutional: She appears well-developed and well-nourished.  HENT:  Head: Normocephalic and atraumatic.  Right Ear: External ear normal.  Left Ear: External ear normal.  Nose: Nose normal.  Mouth/Throat: Uvula is midline, oropharynx is clear and moist and mucous membranes are normal. No tonsillar exudate.  Eyes: Pupils are equal, round, and reactive to light. Right eye exhibits no discharge. Left eye exhibits no discharge. No scleral icterus.  Neck: Trachea normal. Neck supple. No spinous process tenderness present. No neck rigidity. Normal range of motion present.  Cardiovascular: Normal rate, regular rhythm and intact distal pulses.  No murmur heard. Pulses:      Radial pulses are 2+ on the right side, and  2+ on the left side.       Dorsalis pedis pulses are 2+ on the right side, and 2+ on the left side.       Posterior tibial pulses are 2+ on the right side, and 2+ on the left side.  No lower extremity swelling or edema. Calves symmetric in size bilaterally.  Pulmonary/Chest: Effort normal and breath sounds normal. She exhibits no tenderness.  No increased work of breathing. No accessory muscle use. Patient is sitting upright, speaking in full sentences without difficulty   Abdominal: Soft. Bowel sounds are normal. There is no tenderness. There is no rebound and no guarding.  Musculoskeletal: She exhibits no edema.  Lymphadenopathy:    She has no cervical adenopathy.  Neurological: She is alert.  Skin: Skin is warm and dry. No rash noted. She is not diaphoretic.  Psychiatric: She has a normal mood and affect.  Nursing note and vitals reviewed.    ED Treatments / Results  Labs (all labs ordered are listed, but only abnormal results are displayed) Labs Reviewed  BASIC METABOLIC PANEL - Abnormal; Notable for the following components:      Result Value   Potassium 3.4 (*)    GFR calc non Af Amer 60 (*)    All other components within normal limits  CBC  BRAIN NATRIURETIC PEPTIDE  I-STAT TROPONIN, ED    EKG EKG Interpretation  Date/Time:  Tuesday January 26 2018 18:22:57 EDT Ventricular Rate:  68 PR Interval:    QRS Duration: 97 QT Interval:  559 QTC Calculation: 595 R Axis:   48 Text Interpretation:  Sinus rhythm Probable left atrial enlargement Borderline repolarization abnormality Prolonged QT interval No significant change since last tracing Confirmed by Dorie Rank [phone]) on 01/26/2018 6:30:26 PM   Radiology Dg Chest 2 View  Result Date: 01/26/2018 CLINICAL DATA:  72 year old female with history of elevated D-dimer. EXAM: CHEST - 2 VIEW COMPARISON:  Chest x-ray 10/22/2012. FINDINGS: Lung volumes are normal. No consolidative airspace disease. No pleural effusions. No  pneumothorax. No pulmonary nodule or mass noted. Pulmonary vasculature and the cardiomediastinal silhouette are within normal limits. Atherosclerosis in the thoracic aorta. IMPRESSION: 1.  No radiographic evidence of acute cardiopulmonary disease. 2. Aortic atherosclerosis. Electronically Signed   By: Vinnie Langton M.D.   On: 01/26/2018 19:41   Ct Angio Chest  Pe W/cm &/or Wo Cm  Result Date: 01/26/2018 CLINICAL DATA:  Elevated D-dimer EXAM: CT ANGIOGRAPHY CHEST WITH CONTRAST TECHNIQUE: Multidetector CT imaging of the chest was performed using the standard protocol during bolus administration of intravenous contrast. Multiplanar CT image reconstructions and MIPs were obtained to evaluate the vascular anatomy. CONTRAST:  136mL ISOVUE-370 IOPAMIDOL (ISOVUE-370) INJECTION 76% COMPARISON:  Chest x-ray earlier today FINDINGS: Cardiovascular: No filling defects in the pulmonary arteries to suggest pulmonary emboli. Heart is normal size. Aorta is normal caliber. Scattered coronary artery and aortic calcifications. Mediastinum/Nodes: No mediastinal, hilar, or axillary adenopathy. Lungs/Pleura: Lungs are clear. No focal airspace opacities or suspicious nodules. No effusions. Upper Abdomen: Imaging into the upper abdomen shows no acute findings. Musculoskeletal: Chest wall soft tissues are unremarkable. No acute bony abnormality. Review of the MIP images confirms the above findings. IMPRESSION: No evidence of pulmonary embolus. No acute cardiopulmonary disease. Aortic Atherosclerosis (ICD10-I70.0). Electronically Signed   By: Rolm Baptise M.D.   On: 01/26/2018 20:07    Procedures Procedures (including critical care time)  Medications Ordered in ED Medications  iopamidol (ISOVUE-370) 76 % injection 100 mL (100 mLs Intravenous Contrast Given 01/26/18 1954)     Initial Impression / Assessment and Plan / ED Course  I have reviewed the triage vital signs and the nursing notes.  Pertinent labs & imaging results  that were available during my care of the patient were reviewed by me and considered in my medical decision making (see chart for details).     72 year old female with a history of hypertension hyperlipidemia presenting with elevated d-dimer from primary care doctor's office.  Patient has had 2 episodes of diffuse chest pain is related to her left shoulder that she describes as pleuritic and hard to catch her breath.  Her chest pain was never exertional in nature.  No associated nausea, emesis or diaphoresis.  Patient denies any tobacco use.  No prior history of MI.  She notes she is usually able to exert herself in activities such as ballroom dancing without any chest pain.  Vital signs are stable on presentation.  Patient is trachea is midline.  No JVD.  No lower extremity edema.  Heart is regular rate and rhythm.  No murmurs auscultated.  Lungs are clear to auscultation bilaterally.  Patient has been chest pain-free and asymptomatic for several days.  Work-up has been overall reassuring.  EKG sinus rhythm.  There is no significant change from prior.  No ST-T changes.  Troponin within normal limits.  Lab work otherwise reassuring.  EKG without any abnormalities.  CTA without evidence of PE.  There is no acute cardiopulmonary disease.  Patient does have noted atherosclerosis, however given patient's reassuring work-up here today and that she has been chest pain-free for several days do not feel she requires admission at this time for work-up. She remains asymptomatic. She has close follow-up with her primary care provider and seems reasonable for outpatient discharge.  Recommended that she does follow-up as she may require further work-up as an outpatient.  I discussed reasons to return to the emergency department.  Patient and family state understanding and agreement with plan.  I have provided a referral to cardiology as needed.  Patient appears safe for discharge. Patient case discussed with Dr. Tomi Bamberger who  is in agreement with plan.  Final Clinical Impressions(s) / ED Diagnoses   Final diagnoses:  Atypical chest pain    ED Discharge Orders    None  Lorelle Gibbs 01/28/18 Corey Skains    Dorie Rank, MD 01/29/18 1324

## 2018-01-26 NOTE — Discharge Instructions (Addendum)
Read instructions below for reasons to return to the Emergency Department. It is recommended that your follow up with your Primary Care Doctor in regards to today's visit. If you do not have a doctor, use the resource guide listed below to help you find one.   Tests performed today include: An EKG of your heart A chest x-ray CT scan of your chest Cardiac enzymes - a blood test for heart muscle damage Blood counts and electrolytes Vital signs. See below for your results today.   Chest Pain (Nonspecific)  HOME CARE INSTRUCTIONS  For the next few days, avoid physical activities that bring on chest pain. Continue physical activities as directed.  Do not smoke cigarettes or drink alcohol until your symptoms are gone. If you do smoke, it is time to quit. You may receive instructions and counseling on how to stop smoking. Only take over-the-counter or prescription medicine for pain, discomfort, or fever as directed by your caregiver.  Follow your caregiver's suggestions for further testing if your chest pain does not go away.  Keep any follow-up appointments you made. If you do not go to an appointment, you could develop lasting (chronic) problems with pain. If there is any problem keeping an appointment, you must call to reschedule.  SEEK MEDICAL CARE IF:  You think you are having problems from the medicine you are taking. Read your medicine instructions carefully.  Your chest pain does not go away, even after treatment.  You develop a rash with blisters on your chest.  SEEK IMMEDIATE MEDICAL CARE IF:  You have increased chest pain or pain that spreads to your arm, neck, jaw, back, or belly (abdomen).  You develop shortness of breath, an increasing cough, or you are coughing up blood.  You have severe back or abdominal pain, feel sick to your stomach (nauseous) or throw up (vomit).  You develop severe weakness, fainting, or chills.  You have an oral temperature above 102 F (38.9 C), not  controlled by medicine.  THIS IS AN EMERGENCY. Do not wait to see if the pain will go away. Get medical help at once. Call your local emergency services (911 in [place] Do not drive yourself to the hospital. Additional Information:  Your vital signs today were: BP (!) 142/81    Pulse 73    Temp 97.6 F (36.4 C) (Oral)    Resp 19    Ht 5\' 6"  (1.676 m)    Wt 58.1 kg    SpO2 93%    BMI 20.67 kg/m  If your blood pressure (BP) was elevated above 135/85 this visit, please have this repeated by your doctor within one month. ---------------

## 2018-02-01 DIAGNOSIS — R0781 Pleurodynia: Secondary | ICD-10-CM | POA: Diagnosis not present

## 2018-02-02 ENCOUNTER — Encounter: Payer: Self-pay | Admitting: Family Medicine

## 2018-02-02 ENCOUNTER — Ambulatory Visit: Payer: PPO | Admitting: Family Medicine

## 2018-02-02 VITALS — BP 150/74 | HR 80 | Ht 66.0 in | Wt 128.0 lb

## 2018-02-02 DIAGNOSIS — M999 Biomechanical lesion, unspecified: Secondary | ICD-10-CM | POA: Diagnosis not present

## 2018-02-02 DIAGNOSIS — M48061 Spinal stenosis, lumbar region without neurogenic claudication: Secondary | ICD-10-CM

## 2018-02-02 NOTE — Patient Instructions (Signed)
Good to see you  You will do great Hope the chest thing never comes back  See me again in 4 weeks

## 2018-02-02 NOTE — Assessment & Plan Note (Signed)
Decision today to treat with OMT was based on Physical Exam  After verbal consent patient was treated with HVLA, ME, FPR techniques in cervical, thoracic, rib, lumbar and sacral areas  Patient tolerated the procedure well with improvement in symptoms  Patient given exercises, stretches and lifestyle modifications  See medications in patient instructions if given  Patient will follow up in 4 weeks 

## 2018-02-02 NOTE — Assessment & Plan Note (Signed)
Moderate to severe overall.  I do believe that patient did have an exacerbation at one point.  Patient seems to be doing a little better at the moment.  We discussed icing regimen.  Patient has been doing this for repetitively.  I am hoping that this continues to improve overall.  Has been responding very well to manipulation we will follow-up again in 4 weeks

## 2018-02-02 NOTE — Progress Notes (Signed)
[name] [place], [place] [address] Phone: [phone] Subjective:    I Gloria [name] am serving as a Education administrator for Dr. Hulan Saas.    CC: Back pain.  [id number]  Gloria [name] is a 72 y.o. female coming in with complaint of back pain. States that she is feeling better. Here for OMT. Recently seen in ER for chest and left shoulder pain. Was told that it could be her gallbladder.  Patient has a history of low back pain.  Known spinal stenosis lumbar spine.  Patient has been doing relatively well with conservative therapy.  Working out still fairly regularly.  Sometimes does have severe pain that can cause her to decrease her activity for a day or 2 and then seems to improve after there.       Past Medical History:  Diagnosis Date  . Anxiety   . Arthritis   . CKD (chronic kidney disease), stage III ([place])   . Complication of anesthesia 05/2014   had body shaking-observed over night-see anesthesia note-has had tremors post op in past  . Depression   . Hyperlipemia   . Hypertension   . Insomnia    Past Surgical History:  Procedure Laterality Date  . ABDOMINAL HYSTERECTOMY     age 16  . BACK SURGERY    . CARPAL TUNNEL RELEASE  01/20/2012   Procedure: CARPAL TUNNEL RELEASE;  Surgeon: Wynonia Sours, MD;  Location: [facility];  Service: Orthopedics;  Laterality: Right;  . CARPAL TUNNEL RELEASE  02/25/2012   Procedure: CARPAL TUNNEL RELEASE;  Surgeon: Wynonia Sours, MD;  Location: [facility];  Service: Orthopedics;  Laterality: Left;  RELEASE A-1 PULLEY LRF  . COLONOSCOPY WITH PROPOFOL N/A 10/15/2016   Procedure: COLONOSCOPY WITH PROPOFOL;  Surgeon: Wilford Corner, MD;  Location: [facility];  Service: Endoscopy;  Laterality: N/A;  . DISTAL BICEPS TENDON REPAIR Left 06/12/2015   Procedure: REPAIR LEFT BICEPS TENDON AND DECOMPRESSION OF MEDIAN NERVE;  Surgeon: Daryll Brod, MD;  Location: [facility];  Service: Orthopedics;  Laterality: Left;  . SMALL INTESTINE SURGERY    . SMALL INTESTINE SURGERY     has had 6 total bowel obstructions -mostlt adhesions-last 2009  . TONSILLECTOMY     age 33  . TRIGGER FINGER RELEASE  2/12   rt middle  . TRIGGER FINGER RELEASE  2010   lt middle  . ULNAR NERVE TRANSPOSITION Right 05/11/2014   Procedure: DECOMPRESSION ULNAR NERVE RIGHT ELBOW ;  Surgeon: Daryll Brod, MD;  Location: [facility];  Service: Orthopedics;  Laterality: Right;  . ULNAR NERVE TRANSPOSITION Left 07/06/2014   Procedure: DECOMPRESSION  LEFT ULNAR NERVE;  Surgeon: Daryll Brod, MD;  Location: [facility];  Service: Orthopedics;  Laterality: Left;   Social History   Socioeconomic History  . Marital status: Married    Spouse name: Not on file  . Number of children: Not on file  . Years of education: Not on file  . Highest education level: Not on file  Occupational History  . Not on file  Social Needs  . Financial resource strain: Not on file  . Food insecurity:    Worry: Not on file    Inability: Not on file  . Transportation needs:    Medical: Not on file    Non-medical: Not on file  Tobacco Use  . Smoking status: Never Smoker  . Smokeless tobacco: Never  Used  Substance and Sexual Activity  . Alcohol use: No  . Drug use: No  . Sexual activity: Not on file  Lifestyle  . Physical activity:    Days per week: Not on file    Minutes per session: Not on file  . Stress: Not on file  Relationships  . Social connections:    Talks on phone: Not on file    Gets together: Not on file    Attends religious service: Not on file    Active member of club or organization: Not on file    Attends meetings of clubs or organizations: Not on file    Relationship status: Not on file  Other Topics Concern  . Not on file  Social History Narrative  . Not on file   Allergies  Allergen Reactions  . Versed [Midazolam] Other (See Comments)  . Diprivan  [Propofol] Other (See Comments)    Uncontrolled shaking  . Fentanyl   . Sevoflurane   . Zofran [Ondansetron Hcl]    Family History  Problem Relation Age of Onset  . Heart failure Father     Current Outpatient Medications (Endocrine & Metabolic):  .  alendronate (FOSAMAX) 10 MG tablet, Take 10 mg by mouth daily before breakfast. Take with a full glass of water on an empty stomach.  Current Outpatient Medications (Cardiovascular):  .  hydrochlorothiazide (MICROZIDE) 12.5 MG capsule, Take 12.5 mg by mouth daily.   Current Outpatient Medications (Analgesics):  .  aspirin 81 MG tablet, Take 81 mg by mouth daily.   Current Outpatient Medications (Other):  Marland Kitchen  Carboxymethylcellulose Sodium (EYE DROPS OP), Place 1 drop into both eyes daily as needed (dry eyes). Adora Fridge VAGINAL 0.1 MG/GM vaginal cream,  .  gabapentin (NEURONTIN) 100 MG capsule, TAKE 2 CAPSULES BY MOUTH AT BEDTIME .  hydroxychloroquine (PLAQUENIL) 200 MG tablet, Take 400 mg by mouth daily.  .  Misc Natural Products (TART CHERRY ADVANCED PO), Take 1 tablet by mouth daily. .  Multiple Vitamins-Calcium (ONE-A-DAY WOMENS PO), Take 1 tablet by mouth daily. .  potassium chloride (K-DUR,KLOR-CON) 10 MEQ tablet, Take 10 mEq by mouth daily. .  traZODone (DESYREL) 50 MG tablet, Take 50-150 mg by mouth at bedtime.  .  TURMERIC PO, Take 1 tablet by mouth daily. .  Vitamin D, Ergocalciferol, (DRISDOL) 50000 units CAPS capsule, TAKE ONE CAPSULE BY MOUTH ONCE A WEEK    Past medical history, social, surgical and family history all reviewed in electronic medical record.  No pertanent information unless stated regarding to the chief complaint.   Review of Systems:  No headache, visual changes, nausea, vomiting, diarrhea, constipation, dizziness, abdominal pain, skin rash, fevers, chills, night sweats, weight loss, swollen lymph nodes, body aches, joint swelling,, chest pain, shortness of breath, mood changes.  Positive muscle aches but  patient does decline any type of chest pain or shortness of breath at the moment  Objective  Blood pressure (!) 150/74, pulse 80, height 5\' 6"  (1.676 m), weight 128 lb (58.1 kg), SpO2 98 %.   General: No apparent distress alert and oriented x3 mood and affect normal, dressed appropriately.  HEENT: Pupils equal, extraocular movements intact  Respiratory: Patient's speak in full sentences and does not appear short of breath  Cardiovascular: No lower extremity edema, non tender, no erythema  Skin: Warm dry intact with no signs of infection or rash on extremities or on axial skeleton.  Abdomen: Soft nontender  Neuro: Cranial nerves II through XII are intact,  neurovascularly intact in all extremities with 2+ DTRs and 2+ pulses.  Lymph: No lymphadenopathy of posterior or anterior cervical chain or axillae bilaterally.  Gait normal with good balance and coordination.  MSK:  Non tender with full range of motion and good stability and symmetric strength and tone of shoulders, elbows, wrist, hip, knee and ankles bilaterally.  Arthritic changes of multiple joints  Low back does show degenerative scoliosis at multiple levels.  Loss of lordosis.  Loss of 5 degrees in all planes.  Negative straight leg test.  Mild positive Corky Sox on the right side.  Neurovascularly intact distally with 5 out of 5 strength in lower extremities  Osteopathic findings C2 flexed rotated and side bent right T3 extended rotated and side bent right inhaled third rib T5 extended rotated and side bent left L3 flexed rotated and side bent right Sacrum right on right     Impression and Recommendations:     This case required medical decision making of moderate complexity. The above documentation has been reviewed and is accurate and complete Lyndal Pulley, DO       Note: This dictation was prepared with Dragon dictation along with smaller phrase technology. Any transcriptional errors that result from this process are  unintentional.

## 2018-02-04 ENCOUNTER — Ambulatory Visit: Payer: PPO | Admitting: Family Medicine

## 2018-02-26 ENCOUNTER — Other Ambulatory Visit: Payer: Self-pay | Admitting: Family Medicine

## 2018-02-26 NOTE — Telephone Encounter (Signed)
Refill done.  

## 2018-02-28 NOTE — Progress Notes (Signed)
[name] [place] [place], [place] [address] Phone: [phone] Subjective:    I Gloria [name] am serving as a Education administrator for Dr. Hulan Saas.   CC: Back pain  [id number]  Gloria [name] is a 72 y.o. female coming in with complaint of back pain. States that her back is doing better. Pain with a lot of walking. Vertigo is bother her more than usual.  Known spinal stenosis.  Discussed posture and ergonomics. Has not been as compliant with the exercises at this time     Past Medical History:  Diagnosis Date  . Anxiety   . Arthritis   . CKD (chronic kidney disease), stage III ([place])   . Complication of anesthesia 05/2014   had body shaking-observed over night-see anesthesia note-has had tremors post op in past  . Depression   . Hyperlipemia   . Hypertension   . Insomnia    Past Surgical History:  Procedure Laterality Date  . ABDOMINAL HYSTERECTOMY     age 22  . BACK SURGERY    . CARPAL TUNNEL RELEASE  01/20/2012   Procedure: CARPAL TUNNEL RELEASE;  Surgeon: Wynonia Sours, MD;  Location: [facility];  Service: Orthopedics;  Laterality: Right;  . CARPAL TUNNEL RELEASE  02/25/2012   Procedure: CARPAL TUNNEL RELEASE;  Surgeon: Wynonia Sours, MD;  Location: [facility];  Service: Orthopedics;  Laterality: Left;  RELEASE A-1 PULLEY LRF  . COLONOSCOPY WITH PROPOFOL N/A 10/15/2016   Procedure: COLONOSCOPY WITH PROPOFOL;  Surgeon: Wilford Corner, MD;  Location: [facility];  Service: Endoscopy;  Laterality: N/A;  . DISTAL BICEPS TENDON REPAIR Left 06/12/2015   Procedure: REPAIR LEFT BICEPS TENDON AND DECOMPRESSION OF MEDIAN NERVE;  Surgeon: Daryll Brod, MD;  Location: [facility];  Service: Orthopedics;  Laterality: Left;  . SMALL INTESTINE SURGERY    . SMALL INTESTINE SURGERY     has had 6 total bowel obstructions -mostlt adhesions-last 2009  . TONSILLECTOMY     age 62  . TRIGGER FINGER RELEASE  2/12   rt  middle  . TRIGGER FINGER RELEASE  2010   lt middle  . ULNAR NERVE TRANSPOSITION Right 05/11/2014   Procedure: DECOMPRESSION ULNAR NERVE RIGHT ELBOW ;  Surgeon: Daryll Brod, MD;  Location: [facility];  Service: Orthopedics;  Laterality: Right;  . ULNAR NERVE TRANSPOSITION Left 07/06/2014   Procedure: DECOMPRESSION  LEFT ULNAR NERVE;  Surgeon: Daryll Brod, MD;  Location: [facility];  Service: Orthopedics;  Laterality: Left;   Social History   Socioeconomic History  . Marital status: Married    Spouse name: Not on file  . Number of children: Not on file  . Years of education: Not on file  . Highest education level: Not on file  Occupational History  . Not on file  Social Needs  . Financial resource strain: Not on file  . Food insecurity:    Worry: Not on file    Inability: Not on file  . Transportation needs:    Medical: Not on file    Non-medical: Not on file  Tobacco Use  . Smoking status: Never Smoker  . Smokeless tobacco: Never Used  Substance and Sexual Activity  . Alcohol use: No  . Drug use: No  . Sexual activity: Not on file  Lifestyle  . Physical activity:    Days per week: Not on file    Minutes per session: Not on file  .  Stress: Not on file  Relationships  . Social connections:    Talks on phone: Not on file    Gets together: Not on file    Attends religious service: Not on file    Active member of club or organization: Not on file    Attends meetings of clubs or organizations: Not on file    Relationship status: Not on file  Other Topics Concern  . Not on file  Social History Narrative  . Not on file   Allergies  Allergen Reactions  . Versed [Midazolam] Other (See Comments)  . Diprivan [Propofol] Other (See Comments)    Uncontrolled shaking  . Fentanyl   . Sevoflurane   . Zofran [Ondansetron Hcl]    Family History  Problem Relation Age of Onset  . Heart failure Father     Current Outpatient Medications (Endocrine &  Metabolic):  .  alendronate (FOSAMAX) 10 MG tablet, Take 10 mg by mouth daily before breakfast. Take with a full glass of water on an empty stomach.  Current Outpatient Medications (Cardiovascular):  .  hydrochlorothiazide (MICROZIDE) 12.5 MG capsule, Take 12.5 mg by mouth daily.   Current Outpatient Medications (Analgesics):  .  aspirin 81 MG tablet, Take 81 mg by mouth daily.   Current Outpatient Medications (Other):  Marland Kitchen  Carboxymethylcellulose Sodium (EYE DROPS OP), Place 1 drop into both eyes daily as needed (dry eyes). Adora Fridge VAGINAL 0.1 MG/GM vaginal cream,  .  gabapentin (NEURONTIN) 100 MG capsule, TAKE 2 CAPSULES BY MOUTH AT BEDTIME .  hydroxychloroquine (PLAQUENIL) 200 MG tablet, Take 400 mg by mouth daily.  .  Misc Natural Products (TART CHERRY ADVANCED PO), Take 1 tablet by mouth daily. .  Multiple Vitamins-Calcium (ONE-A-DAY WOMENS PO), Take 1 tablet by mouth daily. .  potassium chloride (K-DUR,KLOR-CON) 10 MEQ tablet, Take 10 mEq by mouth daily. .  traZODone (DESYREL) 50 MG tablet, Take 50-150 mg by mouth at bedtime.  .  TURMERIC PO, Take 1 tablet by mouth daily. .  Vitamin D, Ergocalciferol, (DRISDOL) 50000 units CAPS capsule, TAKE ONE CAPSULE BY MOUTH ONCE A WEEK    Past medical history, social, surgical and family history all reviewed in electronic medical record.  No pertanent information unless stated regarding to the chief complaint.   Review of Systems:  No headache, visual changes, nausea, vomiting, diarrhea, constipation, dizziness, abdominal pain, skin rash, fevers, chills, night sweats, weight loss, swollen lymph nodes, body aches, joint swelling,  chest pain, shortness of breath, mood changes.  Positive muscle aches  Objective  Blood pressure 140/70, pulse 90, height 5\' 6"  (1.676 m), weight 131 lb (59.4 kg), SpO2 97 %.   General: No apparent distress alert and oriented x3 mood and affect normal, dressed appropriately.  HEENT: Pupils equal, extraocular  movements intact  Respiratory: Patient's speak in full sentences and does not appear short of breath  Cardiovascular: No lower extremity edema, non tender, no erythema  Skin: Warm dry intact with no signs of infection or rash on extremities or on axial skeleton.  Abdomen: Soft nontender  Neuro: Cranial nerves II through XII are intact, neurovascularly intact in all extremities with 2+ DTRs and 2+ pulses.  Lymph: No lymphadenopathy of posterior or anterior cervical chain or axillae bilaterally.  Gait antalgic MSK:  Non tender with full range of motion and good stability and symmetric strength and tone of shoulders, elbows, wrist, hip, knee and ankles bilaterally.  Arthritic changes.  Back exam does have some degenerative scoliosis.  Patient does have some tenderness to palpation the paraspinal musculature.  Negative straight leg test.  Tightness with Corky Sox test bilaterally.  More pain over the sacroiliac joints bilaterally  Osteopathic findings  C6 flexed rotated and side bent left T3 extended rotated and side bent right inhaled third rib T9 extended rotated and side bent left L2 flexed rotated and side bent right Sacrum right on right     Impression and Recommendations:     This case required medical decision making of moderate complexity. The above documentation has been reviewed and is accurate and complete Lyndal Pulley, DO       Note: This dictation was prepared with Dragon dictation along with smaller phrase technology. Any transcriptional errors that result from this process are unintentional.

## 2018-03-02 ENCOUNTER — Encounter: Payer: Self-pay | Admitting: Family Medicine

## 2018-03-02 ENCOUNTER — Ambulatory Visit: Payer: PPO | Admitting: Family Medicine

## 2018-03-02 VITALS — BP 140/70 | HR 90 | Ht 66.0 in | Wt 131.0 lb

## 2018-03-02 DIAGNOSIS — M48061 Spinal stenosis, lumbar region without neurogenic claudication: Secondary | ICD-10-CM | POA: Diagnosis not present

## 2018-03-02 DIAGNOSIS — M999 Biomechanical lesion, unspecified: Secondary | ICD-10-CM | POA: Diagnosis not present

## 2018-03-02 NOTE — Patient Instructions (Addendum)
Good to see you  Ice is your friend flonase daily 2 weeks See me again in 3-4 weeks

## 2018-03-02 NOTE — Assessment & Plan Note (Signed)
Decision today to treat with OMT was based on Physical Exam  After verbal consent patient was treated with HVLA, ME, FPR techniques in cervical, thoracic, rib,,lumbar and sacral areas  Patient tolerated the procedure well with improvement in symptoms  Patient given exercises, stretches and lifestyle modifications  See medications in patient instructions if given  Patient will follow up in 3-4 weeks

## 2018-03-02 NOTE — Assessment & Plan Note (Signed)
Moderate to severe arthritis with degenerative scoliosis and spinal stenosis.  Doing relatively well though with conservative therapy.  Responding well to manipulation.  Patient will be traveling in 3 to 4 weeks and will see her before her travels.  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

## 2018-03-22 DIAGNOSIS — I10 Essential (primary) hypertension: Secondary | ICD-10-CM | POA: Diagnosis not present

## 2018-03-22 DIAGNOSIS — Z6821 Body mass index (BMI) 21.0-21.9, adult: Secondary | ICD-10-CM | POA: Diagnosis not present

## 2018-03-22 NOTE — Progress Notes (Signed)
[name] [place], [name] [address] Phone: [phone] Subjective:   [name], am serving as a scribe for Dr. Hulan Saas.  I'm seeing this patient by the request  of:    CC: Back pain follow-up  [id number]  Gloria [name] is a 72 y.o. female coming in with complaint of back pain. She has been doing well. She is going on a trip to [place] and is going to be there for a couple weeks. Is here for OMT to manage her back pain.  Known spinal stenosis with degenerative scoliosis.  Nothing that stops her from activity.  Overall feeling better.  Patient will be traveling though and is concerned that she may have an exacerbation.  Has some medicines for any type of breakthrough pain.  Denies any new symptoms.     Past Medical History:  Diagnosis Date  . Anxiety   . Arthritis   . CKD (chronic kidney disease), stage III ([place])   . Complication of anesthesia 05/2014   had body shaking-observed over night-see anesthesia note-has had tremors post op in past  . Depression   . Hyperlipemia   . Hypertension   . Insomnia    Past Surgical History:  Procedure Laterality Date  . ABDOMINAL HYSTERECTOMY     age 36  . BACK SURGERY    . CARPAL TUNNEL RELEASE  01/20/2012   Procedure: CARPAL TUNNEL RELEASE;  Surgeon: Wynonia Sours, MD;  Location: [place];  Service: Orthopedics;  Laterality: Right;  . CARPAL TUNNEL RELEASE  02/25/2012   Procedure: CARPAL TUNNEL RELEASE;  Surgeon: Wynonia Sours, MD;  Location: [facility];  Service: Orthopedics;  Laterality: Left;  RELEASE A-1 PULLEY LRF  . COLONOSCOPY WITH PROPOFOL N/A 10/15/2016   Procedure: COLONOSCOPY WITH PROPOFOL;  Surgeon: Wilford Corner, MD;  Location: [facility];  Service: Endoscopy;  Laterality: N/A;  . DISTAL BICEPS TENDON REPAIR Left 06/12/2015   Procedure: REPAIR LEFT BICEPS TENDON AND DECOMPRESSION OF MEDIAN NERVE;  Surgeon: Daryll Brod, MD;  Location:  [facility];  Service: Orthopedics;  Laterality: Left;  . SMALL INTESTINE SURGERY    . SMALL INTESTINE SURGERY     has had 6 total bowel obstructions -mostlt adhesions-last 2009  . TONSILLECTOMY     age 27  . TRIGGER FINGER RELEASE  2/12   rt middle  . TRIGGER FINGER RELEASE  2010   lt middle  . ULNAR NERVE TRANSPOSITION Right 05/11/2014   Procedure: DECOMPRESSION ULNAR NERVE RIGHT ELBOW ;  Surgeon: Daryll Brod, MD;  Location: [facility];  Service: Orthopedics;  Laterality: Right;  . ULNAR NERVE TRANSPOSITION Left 07/06/2014   Procedure: DECOMPRESSION  LEFT ULNAR NERVE;  Surgeon: Daryll Brod, MD;  Location: [facility];  Service: Orthopedics;  Laterality: Left;   Social History   Socioeconomic History  . Marital status: Married    Spouse name: Not on file  . Number of children: Not on file  . Years of education: Not on file  . Highest education level: Not on file  Occupational History  . Not on file  Social Needs  . Financial resource strain: Not on file  . Food insecurity:    Worry: Not on file    Inability: Not on file  . Transportation needs:    Medical: Not on file    Non-medical: Not on file  Tobacco Use  . Smoking status: Never Smoker  .  Smokeless tobacco: Never Used  Substance and Sexual Activity  . Alcohol use: No  . Drug use: No  . Sexual activity: Not on file  Lifestyle  . Physical activity:    Days per week: Not on file    Minutes per session: Not on file  . Stress: Not on file  Relationships  . Social connections:    Talks on phone: Not on file    Gets together: Not on file    Attends religious service: Not on file    Active member of club or organization: Not on file    Attends meetings of clubs or organizations: Not on file    Relationship status: Not on file  Other Topics Concern  . Not on file  Social History Narrative  . Not on file   Allergies  Allergen Reactions  . Versed [Midazolam] Other (See  Comments)  . Diprivan [Propofol] Other (See Comments)    Uncontrolled shaking  . Fentanyl   . Sevoflurane   . Zofran [Ondansetron Hcl]    Family History  Problem Relation Age of Onset  . Heart failure Father     Current Outpatient Medications (Endocrine & Metabolic):  .  alendronate (FOSAMAX) 10 MG tablet, Take 10 mg by mouth daily before breakfast. Take with a full glass of water on an empty stomach.  Current Outpatient Medications (Cardiovascular):  .  hydrochlorothiazide (MICROZIDE) 12.5 MG capsule, Take 12.5 mg by mouth daily.   Current Outpatient Medications (Analgesics):  .  aspirin 81 MG tablet, Take 81 mg by mouth daily.   Current Outpatient Medications (Other):  Marland Kitchen  Carboxymethylcellulose Sodium (EYE DROPS OP), Place 1 drop into both eyes daily as needed (dry eyes). Adora Fridge VAGINAL 0.1 MG/GM vaginal cream,  .  gabapentin (NEURONTIN) 100 MG capsule, TAKE 2 CAPSULES BY MOUTH AT BEDTIME .  hydroxychloroquine (PLAQUENIL) 200 MG tablet, Take 400 mg by mouth daily.  .  Misc Natural Products (TART CHERRY ADVANCED PO), Take 1 tablet by mouth daily. .  Multiple Vitamins-Calcium (ONE-A-DAY WOMENS PO), Take 1 tablet by mouth daily. .  potassium chloride (K-DUR,KLOR-CON) 10 MEQ tablet, Take 10 mEq by mouth daily. .  traZODone (DESYREL) 50 MG tablet, Take 50-150 mg by mouth at bedtime.  .  TURMERIC PO, Take 1 tablet by mouth daily. .  Vitamin D, Ergocalciferol, (DRISDOL) 50000 units CAPS capsule, TAKE ONE CAPSULE BY MOUTH ONCE A WEEK    Past medical history, social, surgical and family history all reviewed in electronic medical record.  No pertanent information unless stated regarding to the chief complaint.   Review of Systems:  No headache, visual changes, nausea, vomiting, diarrhea, constipation, dizziness, abdominal pain, skin rash, fevers, chills, night sweats, weight loss, swollen lymph nodes, body aches, joint swelling, chest pain, shortness of breath, mood changes.   Positive muscle aches  Objective  Blood pressure (!) 142/82, pulse 83, height 5\' 6"  (1.676 m), weight 134 lb (60.8 kg), SpO2 97 %.   General: No apparent distress alert and oriented x3 mood and affect normal, dressed appropriately.  HEENT: Pupils equal, extraocular movements intact  Respiratory: Patient's speak in full sentences and does not appear short of breath  Cardiovascular: No lower extremity edema, non tender, no erythema  Skin: Warm dry intact with no signs of infection or rash on extremities or on axial skeleton.  Abdomen: Soft nontender  Neuro: Cranial nerves II through XII are intact, neurovascularly intact in all extremities with 2+ DTRs and 2+ pulses.  Lymph:  No lymphadenopathy of posterior or anterior cervical chain or axillae bilaterally.  Gait normal with good balance and coordination.  MSK:  tender with mild limited range of motion and good stability and symmetric strength and tone of shoulders, elbows, wrist, hip, knee and ankles bilaterally.  Patient back exam does have some degenerative scoliosis noted.  Tightness of the musculature of the paraspinal musculature lumbar spine.  Mild positive Corky Sox on the right side.  Osteopathic findings  C6 flexed rotated and side bent left T3 extended rotated and side bent right inhaled third rib T8 extended rotated and side bent left L2 flexed rotated and side bent right Sacrum right on right     Impression and Recommendations:     This case required medical decision making of moderate complexity. The above documentation has been reviewed and is accurate and complete Lyndal Pulley, DO       Note: This dictation was prepared with Dragon dictation along with smaller phrase technology. Any transcriptional errors that result from this process are unintentional.

## 2018-03-23 ENCOUNTER — Ambulatory Visit: Payer: PPO | Admitting: Family Medicine

## 2018-03-23 ENCOUNTER — Encounter: Payer: Self-pay | Admitting: Family Medicine

## 2018-03-23 VITALS — BP 142/82 | HR 83 | Ht 66.0 in | Wt 134.0 lb

## 2018-03-23 DIAGNOSIS — M48061 Spinal stenosis, lumbar region without neurogenic claudication: Secondary | ICD-10-CM | POA: Diagnosis not present

## 2018-03-23 DIAGNOSIS — M999 Biomechanical lesion, unspecified: Secondary | ICD-10-CM | POA: Diagnosis not present

## 2018-03-23 NOTE — Assessment & Plan Note (Signed)
Degenerative spinal stenosis.  Discussed icing regimen and home exercise, discussed which activities of doing which was to avoid.  Discussed posture and ergonomics.  Discussed certain activities and lifting mechanics.  Patient will be out of her routine when she is out of town.  Discussed how she can adapt some of the exercises to this.  Follow-up with me again 4 to 8 weeks

## 2018-03-23 NOTE — Assessment & Plan Note (Signed)
Decision today to treat with OMT was based on Physical Exam  After verbal consent patient was treated with HVLA, ME, FPR techniques in cervical, thoracic, rib,  lumbar and sacral areas  Patient tolerated the procedure well with improvement in symptoms  Patient given exercises, stretches and lifestyle modifications  See medications in patient instructions if given  Patient will follow up in 4-8 weeks 

## 2018-03-23 NOTE — Patient Instructions (Signed)
Good to see you  Ice is your friend Stay active Have a great trip  See me again in 4ish weeks

## 2018-04-07 ENCOUNTER — Other Ambulatory Visit: Payer: Self-pay | Admitting: Family Medicine

## 2018-04-19 NOTE — Progress Notes (Signed)
[name] [place], [place] [address] Phone: [phone] Subjective:   Gloria [name], am serving as a scribe for Dr. Hulan Saas.  CC: Back pain follow-up  [id number]  Gloria [name] is a 72 y.o. female coming in with complaint of back pain. She was in [place] recently and did have some trouble getting in and out of the car. Pain has improved since being back.  Patient states some mild discomfort overall.  Nothing more severe than some of the times we have seen her.  Has responded well to manipulation.     Past Medical History:  Diagnosis Date  . Anxiety   . Arthritis   . CKD (chronic kidney disease), stage III ([place])   . Complication of anesthesia 05/2014   had body shaking-observed over night-see anesthesia note-has had tremors post op in past  . Depression   . Hyperlipemia   . Hypertension   . Insomnia    Past Surgical History:  Procedure Laterality Date  . ABDOMINAL HYSTERECTOMY     age 89  . BACK SURGERY    . CARPAL TUNNEL RELEASE  01/20/2012   Procedure: CARPAL TUNNEL RELEASE;  Surgeon: Wynonia Sours, MD;  Location: [facility];  Service: Orthopedics;  Laterality: Right;  . CARPAL TUNNEL RELEASE  02/25/2012   Procedure: CARPAL TUNNEL RELEASE;  Surgeon: Wynonia Sours, MD;  Location: [facility];  Service: Orthopedics;  Laterality: Left;  RELEASE A-1 PULLEY LRF  . COLONOSCOPY WITH PROPOFOL N/A 10/15/2016   Procedure: COLONOSCOPY WITH PROPOFOL;  Surgeon: Wilford Corner, MD;  Location: [facility];  Service: Endoscopy;  Laterality: N/A;  . DISTAL BICEPS TENDON REPAIR Left 06/12/2015   Procedure: REPAIR LEFT BICEPS TENDON AND DECOMPRESSION OF MEDIAN NERVE;  Surgeon: Daryll Brod, MD;  Location: [facility];  Service: Orthopedics;  Laterality: Left;  . SMALL INTESTINE SURGERY    . SMALL INTESTINE SURGERY     has had 6 total bowel obstructions -mostlt adhesions-last 2009  . TONSILLECTOMY      age 37  . TRIGGER FINGER RELEASE  2/12   rt middle  . TRIGGER FINGER RELEASE  2010   lt middle  . ULNAR NERVE TRANSPOSITION Right 05/11/2014   Procedure: DECOMPRESSION ULNAR NERVE RIGHT ELBOW ;  Surgeon: Daryll Brod, MD;  Location: [facility];  Service: Orthopedics;  Laterality: Right;  . ULNAR NERVE TRANSPOSITION Left 07/06/2014   Procedure: DECOMPRESSION  LEFT ULNAR NERVE;  Surgeon: Daryll Brod, MD;  Location: [facility];  Service: Orthopedics;  Laterality: Left;   Social History   Socioeconomic History  . Marital status: Married    Spouse name: Not on file  . Number of children: Not on file  . Years of education: Not on file  . Highest education level: Not on file  Occupational History  . Not on file  Social Needs  . Financial resource strain: Not on file  . Food insecurity:    Worry: Not on file    Inability: Not on file  . Transportation needs:    Medical: Not on file    Non-medical: Not on file  Tobacco Use  . Smoking status: Never Smoker  . Smokeless tobacco: Never Used  Substance and Sexual Activity  . Alcohol use: [name]  . Drug use: [name]  . Sexual activity: Not on file  Lifestyle  . Physical activity:    Days per week: Not on file  Minutes per session: Not on file  . Stress: Not on file  Relationships  . Social connections:    Talks on phone: Not on file    Gets together: Not on file    Attends religious service: Not on file    Active member of club or organization: Not on file    Attends meetings of clubs or organizations: Not on file    Relationship status: Not on file  Other Topics Concern  . Not on file  Social History Narrative  . Not on file   Allergies  Allergen Reactions  . Versed [Midazolam] Other (See Comments)  . Diprivan [Propofol] Other (See Comments)    Uncontrolled shaking  . Fentanyl   . Sevoflurane   . Zofran [Ondansetron Hcl]    Family History  Problem Relation Age of Onset  . Heart failure Father      Current Outpatient Medications (Endocrine & Metabolic):  .  alendronate (FOSAMAX) 10 MG tablet, Take 10 mg by mouth daily before breakfast. Take with a full glass of water on an empty stomach.  Current Outpatient Medications (Cardiovascular):  .  hydrochlorothiazide (MICROZIDE) 12.5 MG capsule, Take 12.5 mg by mouth daily.   Current Outpatient Medications (Analgesics):  .  aspirin 81 MG tablet, Take 81 mg by mouth daily.   Current Outpatient Medications (Other):  Marland Kitchen  Carboxymethylcellulose Sodium (EYE DROPS OP), Place 1 drop into both eyes daily as needed (dry eyes). Adora Fridge VAGINAL 0.1 MG/GM vaginal cream,  .  gabapentin (NEURONTIN) 100 MG capsule, TAKE 2 CAPSULES BY MOUTH AT BEDTIME .  hydroxychloroquine (PLAQUENIL) 200 MG tablet, Take 400 mg by mouth daily.  .  Misc Natural Products (TART CHERRY ADVANCED PO), Take 1 tablet by mouth daily. .  Multiple Vitamins-Calcium (ONE-A-DAY WOMENS PO), Take 1 tablet by mouth daily. .  potassium chloride (K-DUR,KLOR-CON) 10 MEQ tablet, Take 10 mEq by mouth daily. .  traZODone (DESYREL) 50 MG tablet, Take 50-150 mg by mouth at bedtime.  .  TURMERIC PO, Take 1 tablet by mouth daily. .  Vitamin D, Ergocalciferol, (DRISDOL) 1.25 MG (50000 UT) CAPS capsule, TAKE ONE CAPSULE BY MOUTH ONCE A WEEK    Past medical history, social, surgical and family history all reviewed in electronic medical record.  [name] unless stated regarding to the chief complaint.   Review of Systems:  [name] headache, visual changes, nausea, vomiting, diarrhea, constipation, dizziness, abdominal pain, skin rash, fevers, chills, night sweats, weight loss, swollen lymph nodes, body aches, joint swelling, muscle aches, chest pain, shortness of breath, mood changes.   Objective  Blood pressure 140/90, pulse 76, height 5\' 6"  (1.676 m), weight 131 lb (59.4 kg), SpO2 97 %.   General: [name] apparent distress alert and oriented x3 mood and affect normal, dressed  appropriately.  HEENT: Pupils equal, extraocular movements intact  Respiratory: Patient's speak in full sentences and does not appear short of breath  Cardiovascular: [name] lower extremity edema, non tender, [name] erythema  Skin: Warm dry intact with [name] signs of infection or rash on extremities or on axial skeleton.  Abdomen: Soft nontender  Neuro: Cranial nerves II through XII are intact, neurovascularly intact in all extremities with 2+ DTRs and 2+ pulses.  Lymph: [name] lymphadenopathy of posterior or anterior cervical chain or axillae bilaterally.  Gait antalgic MSK:  tender with full range of motion and good stability and symmetric strength and tone of shoulders, elbows, wrist, hip, knee and ankles bilaterally.  Patient back exam does  have some degenerative scoliosis of the back noted.  Some tightness of the hip flexors noted as well as the [facility] test. Tender to palpation of the left sacroiliac joint.  Osteopathic findings  C4 flexed rotated and side bent left T3 extended rotated and side bent right inhaled third rib L3 flexed rotated and side bent right Sacrum left on left        Impression and Recommendations:     This case required medical decision making of moderate complexity. The above documentation has been reviewed and is accurate and complete Lyndal Pulley, DO       Note: This dictation was prepared with Dragon dictation along with smaller phrase technology. Any transcriptional errors that result from this process are unintentional.

## 2018-04-20 ENCOUNTER — Encounter: Payer: Self-pay | Admitting: Family Medicine

## 2018-04-20 ENCOUNTER — Ambulatory Visit: Payer: PPO | Admitting: Family Medicine

## 2018-04-20 VITALS — BP 140/90 | HR 76 | Ht 66.0 in | Wt 131.0 lb

## 2018-04-20 DIAGNOSIS — M999 Biomechanical lesion, unspecified: Secondary | ICD-10-CM

## 2018-04-20 DIAGNOSIS — M48061 Spinal stenosis, lumbar region without neurogenic claudication: Secondary | ICD-10-CM | POA: Diagnosis not present

## 2018-04-20 NOTE — Patient Instructions (Signed)
Good to see you  Keep it up  Proud of you  Happy holidays!  See me again in 4 weeks

## 2018-04-20 NOTE — Assessment & Plan Note (Signed)
Degenerative changes of the spinal stenosis noted.  Discussed icing regimen and home exercise.  Which activities to do which wants to avoid.  Discussed posture and ergonomics.  Follow-up in 4 weeks

## 2018-04-20 NOTE — Assessment & Plan Note (Signed)
Decision today to treat with OMT was based on Physical Exam  After verbal consent patient was treated with HVLA, ME, FPR techniques in cervical, thoracic, rib lumbar and sacral areas  Patient tolerated the procedure well with improvement in symptoms  Patient given exercises, stretches and lifestyle modifications  See medications in patient instructions if given  Patient will follow up in 4-6 weeks 

## 2018-05-07 DIAGNOSIS — Z6821 Body mass index (BMI) 21.0-21.9, adult: Secondary | ICD-10-CM | POA: Diagnosis not present

## 2018-05-07 DIAGNOSIS — Z79899 Other long term (current) drug therapy: Secondary | ICD-10-CM | POA: Diagnosis not present

## 2018-05-07 DIAGNOSIS — M150 Primary generalized (osteo)arthritis: Secondary | ICD-10-CM | POA: Diagnosis not present

## 2018-05-07 DIAGNOSIS — M0609 Rheumatoid arthritis without rheumatoid factor, multiple sites: Secondary | ICD-10-CM | POA: Diagnosis not present

## 2018-05-17 NOTE — Progress Notes (Signed)
[name] [place], [place] [address] Phone: [phone] Subjective:    I [name] am serving as a Education administrator for Dr. Hulan Saas.    CC: Back pain neck pain follow-up  [id number]  Gloria [name] is a 73 y.o. female coming in with complaint of back pain. Right shoulder has been bothering her lately. Has had 2 rotator cuff surgeries. Believes the pain is due to the weather change.  Patient continues to try to do the vitamin supplementations and works out on a fairly regular basis.  When she works out she does seem to be feeling better.     Past Medical History:  Diagnosis Date  . Anxiety   . Arthritis   . CKD (chronic kidney disease), stage III ([place])   . Complication of anesthesia 05/2014   had body shaking-observed over night-see anesthesia note-has had tremors post op in past  . Depression   . Hyperlipemia   . Hypertension   . Insomnia    Past Surgical History:  Procedure Laterality Date  . ABDOMINAL HYSTERECTOMY     age 84  . BACK SURGERY    . CARPAL TUNNEL RELEASE  01/20/2012   Procedure: CARPAL TUNNEL RELEASE;  Surgeon: Wynonia Sours, MD;  Location: [facility];  Service: Orthopedics;  Laterality: Right;  . CARPAL TUNNEL RELEASE  02/25/2012   Procedure: CARPAL TUNNEL RELEASE;  Surgeon: Wynonia Sours, MD;  Location: [facility];  Service: Orthopedics;  Laterality: Left;  RELEASE A-1 PULLEY LRF  . COLONOSCOPY WITH PROPOFOL N/A 10/15/2016   Procedure: COLONOSCOPY WITH PROPOFOL;  Surgeon: Wilford Corner, MD;  Location: [facility];  Service: Endoscopy;  Laterality: N/A;  . DISTAL BICEPS TENDON REPAIR Left 06/12/2015   Procedure: REPAIR LEFT BICEPS TENDON AND DECOMPRESSION OF MEDIAN NERVE;  Surgeon: Daryll Brod, MD;  Location: [facility];  Service: Orthopedics;  Laterality: Left;  . SMALL INTESTINE SURGERY    . SMALL INTESTINE SURGERY     has had 6 total bowel obstructions -mostlt  adhesions-last 2009  . TONSILLECTOMY     age 19  . TRIGGER FINGER RELEASE  2/12   rt middle  . TRIGGER FINGER RELEASE  2010   lt middle  . ULNAR NERVE TRANSPOSITION Right 05/11/2014   Procedure: DECOMPRESSION ULNAR NERVE RIGHT ELBOW ;  Surgeon: Daryll Brod, MD;  Location: [facility];  Service: Orthopedics;  Laterality: Right;  . ULNAR NERVE TRANSPOSITION Left 07/06/2014   Procedure: DECOMPRESSION  LEFT ULNAR NERVE;  Surgeon: Daryll Brod, MD;  Location: [facility];  Service: Orthopedics;  Laterality: Left;   Social History   Socioeconomic History  . Marital status: Married    Spouse name: Not on file  . Number of children: Not on file  . Years of education: Not on file  . Highest education level: Not on file  Occupational History  . Not on file  Social Needs  . Financial resource strain: Not on file  . Food insecurity:    Worry: Not on file    Inability: Not on file  . Transportation needs:    Medical: Not on file    Non-medical: Not on file  Tobacco Use  . Smoking status: Never Smoker  . Smokeless tobacco: Never Used  Substance and Sexual Activity  . Alcohol use: No  . Drug use: No  . Sexual activity: Not on file  Lifestyle  . Physical activity:  Days per week: Not on file    Minutes per session: Not on file  . Stress: Not on file  Relationships  . Social connections:    Talks on phone: Not on file    Gets together: Not on file    Attends religious service: Not on file    Active member of club or organization: Not on file    Attends meetings of clubs or organizations: Not on file    Relationship status: Not on file  Other Topics Concern  . Not on file  Social History Narrative  . Not on file   Allergies  Allergen Reactions  . Versed [Midazolam] Other (See Comments)  . Diprivan [Propofol] Other (See Comments)    Uncontrolled shaking  . Fentanyl   . Sevoflurane   . Zofran [Ondansetron Hcl]    Family History  Problem Relation Age  of Onset  . Heart failure Father     Current Outpatient Medications (Endocrine & Metabolic):  .  alendronate (FOSAMAX) 10 MG tablet, Take 10 mg by mouth daily before breakfast. Take with a full glass of water on an empty stomach.  Current Outpatient Medications (Cardiovascular):  .  hydrochlorothiazide (MICROZIDE) 12.5 MG capsule, Take 12.5 mg by mouth daily.   Current Outpatient Medications (Analgesics):  .  aspirin 81 MG tablet, Take 81 mg by mouth daily.   Current Outpatient Medications (Other):  Marland Kitchen  Carboxymethylcellulose Sodium (EYE DROPS OP), Place 1 drop into both eyes daily as needed (dry eyes). Adora Fridge VAGINAL 0.1 MG/GM vaginal cream,  .  gabapentin (NEURONTIN) 100 MG capsule, TAKE 2 CAPSULES BY MOUTH AT BEDTIME .  hydroxychloroquine (PLAQUENIL) 200 MG tablet, Take 400 mg by mouth daily.  .  Misc Natural Products (TART CHERRY ADVANCED PO), Take 1 tablet by mouth daily. .  Multiple Vitamins-Calcium (ONE-A-DAY WOMENS PO), Take 1 tablet by mouth daily. .  potassium chloride (K-DUR,KLOR-CON) 10 MEQ tablet, Take 10 mEq by mouth daily. .  traZODone (DESYREL) 50 MG tablet, Take 50-150 mg by mouth at bedtime.  .  TURMERIC PO, Take 1 tablet by mouth daily. .  Vitamin D, Ergocalciferol, (DRISDOL) 1.25 MG (50000 UT) CAPS capsule, TAKE ONE CAPSULE BY MOUTH ONCE A WEEK    Past medical history, social, surgical and family history all reviewed in electronic medical record.  No pertanent information unless stated regarding to the chief complaint.   Review of Systems:  No headache, visual changes, nausea, vomiting, diarrhea, constipation, dizziness, abdominal pain, skin rash, fevers, chills, night sweats, weight loss, swollen lymph nodes, body aches, joint swelling, muscle aches, chest pain, shortness of breath, mood changes.   Objective  Blood pressure (!) 160/78, pulse 76, height 5\' 6"  (1.676 m), weight 131 lb (59.4 kg), SpO2 98 %.    General: No apparent distress alert and oriented  x3 mood and affect normal, dressed appropriately.  HEENT: Pupils equal, extraocular movements intact  Respiratory: Patient's speak in full sentences and does not appear short of breath  Cardiovascular: No lower extremity edema, non tender, no erythema  Skin: Warm dry intact with no signs of infection or rash on extremities or on axial skeleton.  Abdomen: Soft nontender  Neuro: Cranial nerves II through XII are intact, neurovascularly intact in all extremities with 2+ DTRs and 2+ pulses.  Lymph: No lymphadenopathy of posterior or anterior cervical chain or axillae bilaterally.  Gait normal with good balance and coordination.  MSK:  Non tender with full range of motion and good stability and  symmetric strength and tone of shoulders, elbows, wrist, hip, knee and ankles bilaterally.  Back Exam:  Inspection: Degenerative scoliosis Motion: Flexion 35 deg, Extension 25 deg, Side Bending to 35 deg bilaterally,  Rotation to 35 deg bilaterally  SLR laying: Negative  XSLR laying: Negative  Palpable tenderness: Tenderness in the thoracolumbar lumbosacral area. FABER: Mild tightness of the left. Sensory change: Gross sensation intact to all lumbar and sacral dermatomes.  Reflexes: 2+ at both patellar tendons, 2+ at achilles tendons, Babinski's downgoing.  Strength at foot  Plantar-flexion: 5/5 Dorsi-flexion: 5/5 Eversion: 5/5 Inversion: 5/5  Leg strength  Quad: 5/5 Hamstring: 5/5 Hip flexor: 5/5 Hip abductors: 5/5  Gait unremarkable.  Osteopathic findings C6 flexed rotated and side bent left T3 extended rotated and side bent right inhaled third rib T6 extended rotated and side bent left L2 flexed rotated and side bent right Sacrum left on left   Impression and Recommendations:     This case required medical decision making of moderate complexity. The above documentation has been reviewed and is accurate and complete Lyndal Pulley, DO       Note: This dictation was prepared with Dragon  dictation along with smaller phrase technology. Any transcriptional errors that result from this process are unintentional.

## 2018-05-18 ENCOUNTER — Encounter: Payer: Self-pay | Admitting: Family Medicine

## 2018-05-18 ENCOUNTER — Ambulatory Visit: Payer: PPO | Admitting: Family Medicine

## 2018-05-18 VITALS — BP 160/78 | HR 76 | Ht 66.0 in | Wt 131.0 lb

## 2018-05-18 DIAGNOSIS — M48061 Spinal stenosis, lumbar region without neurogenic claudication: Secondary | ICD-10-CM

## 2018-05-18 DIAGNOSIS — M999 Biomechanical lesion, unspecified: Secondary | ICD-10-CM

## 2018-05-18 NOTE — Assessment & Plan Note (Signed)
Degenerative lumbar spinal stenosis.  Sometimes causes more of a tightness of the hamstrings.  Discussed icing regimen and home exercises.  Discussed which activities to do which wants to avoid.  Discussed posture and ergonomics.  Patient will increase activity slowly over the course of next several days.  Follow-up with me again in 4 to 8 weeks responding well to manipulation

## 2018-05-18 NOTE — Assessment & Plan Note (Signed)
Decision today to treat with OMT was based on Physical Exam  After verbal consent patient was treated with HVLA, ME, FPR techniques in cervical, thoracic rib, lumbar and sacral areas  Patient tolerated the procedure well with improvement in symptoms  Patient given exercises, stretches and lifestyle modifications  See medications in patient instructions if given  Patient will follow up in 4-8 weeks 

## 2018-05-18 NOTE — Patient Instructions (Signed)
Good to see you  Ice is your friend Exercises 3 times a week.  Keep hands within peripheral vision  See me again in 3-6 weeks

## 2018-06-01 DIAGNOSIS — G4700 Insomnia, unspecified: Secondary | ICD-10-CM | POA: Diagnosis not present

## 2018-06-01 DIAGNOSIS — M069 Rheumatoid arthritis, unspecified: Secondary | ICD-10-CM | POA: Diagnosis not present

## 2018-06-01 DIAGNOSIS — I10 Essential (primary) hypertension: Secondary | ICD-10-CM | POA: Diagnosis not present

## 2018-06-14 NOTE — Progress Notes (Signed)
[name] [address], [place] [address] Phone: [phone] Subjective:   [name], am serving as a scribe for Dr. Hulan Saas.   CC: Back pain follow-up  NOB:[id number]  Gloria [name] is a 73 y.o. female coming in with complaint of back pain. Patient is here for OMT to manage her back pain. Patient states that she has not had an increase in her back pain.  Patient has been doing very well well.  Working out on a regular basis.     Past Medical History:  Diagnosis Date  . Anxiety   . Arthritis   . CKD (chronic kidney disease), stage III ([place])   . Complication of anesthesia 05/2014   had body shaking-observed over night-see anesthesia note-has had tremors post op in past  . Depression   . Hyperlipemia   . Hypertension   . Insomnia    Past Surgical History:  Procedure Laterality Date  . ABDOMINAL HYSTERECTOMY     age 89  . BACK SURGERY    . CARPAL TUNNEL RELEASE  01/20/2012   Procedure: CARPAL TUNNEL RELEASE;  Surgeon: Wynonia Sours, MD;  Location: [facility];  Service: Orthopedics;  Laterality: Right;  . CARPAL TUNNEL RELEASE  02/25/2012   Procedure: CARPAL TUNNEL RELEASE;  Surgeon: Wynonia Sours, MD;  Location: [facility];  Service: Orthopedics;  Laterality: Left;  RELEASE A-1 PULLEY LRF  . COLONOSCOPY WITH PROPOFOL N/A 10/15/2016   Procedure: COLONOSCOPY WITH PROPOFOL;  Surgeon: Wilford Corner, MD;  Location: [facility];  Service: Endoscopy;  Laterality: N/A;  . DISTAL BICEPS TENDON REPAIR Left 06/12/2015   Procedure: REPAIR LEFT BICEPS TENDON AND DECOMPRESSION OF MEDIAN NERVE;  Surgeon: Daryll Brod, MD;  Location: [facility];  Service: Orthopedics;  Laterality: Left;  . SMALL INTESTINE SURGERY    . SMALL INTESTINE SURGERY     has had 6 total bowel obstructions -mostlt adhesions-last 2009  . TONSILLECTOMY     age 34  . TRIGGER FINGER RELEASE  2/12   rt middle  . TRIGGER FINGER  RELEASE  2010   lt middle  . ULNAR NERVE TRANSPOSITION Right 05/11/2014   Procedure: DECOMPRESSION ULNAR NERVE RIGHT ELBOW ;  Surgeon: Daryll Brod, MD;  Location: [facility];  Service: Orthopedics;  Laterality: Right;  . ULNAR NERVE TRANSPOSITION Left 07/06/2014   Procedure: DECOMPRESSION  LEFT ULNAR NERVE;  Surgeon: Daryll Brod, MD;  Location: [facility];  Service: Orthopedics;  Laterality: Left;   Social History   Socioeconomic History  . Marital status: Married    Spouse name: Not on file  . Number of children: Not on file  . Years of education: Not on file  . Highest education level: Not on file  Occupational History  . Not on file  Social Needs  . Financial resource strain: Not on file  . Food insecurity:    Worry: Not on file    Inability: Not on file  . Transportation needs:    Medical: Not on file    Non-medical: Not on file  Tobacco Use  . Smoking status: Never Smoker  . Smokeless tobacco: Never Used  Substance and Sexual Activity  . Alcohol use: No  . Drug use: No  . Sexual activity: Not on file  Lifestyle  . Physical activity:    Days per week: Not on file    Minutes per session: Not on file  .  Stress: Not on file  Relationships  . Social connections:    Talks on phone: Not on file    Gets together: Not on file    Attends religious service: Not on file    Active member of club or organization: Not on file    Attends meetings of clubs or organizations: Not on file    Relationship status: Not on file  Other Topics Concern  . Not on file  Social History Narrative  . Not on file   Allergies  Allergen Reactions  . Versed [Midazolam] Other (See Comments)  . Diprivan [Propofol] Other (See Comments)    Uncontrolled shaking  . Fentanyl   . Sevoflurane   . Zofran [Ondansetron Hcl]    Family History  Problem Relation Age of Onset  . Heart failure Father     Current Outpatient Medications (Endocrine & Metabolic):  .  alendronate  (FOSAMAX) 10 MG tablet, Take 10 mg by mouth daily before breakfast. Take with a full glass of water on an empty stomach.  Current Outpatient Medications (Cardiovascular):  .  hydrochlorothiazide (MICROZIDE) 12.5 MG capsule, Take 12.5 mg by mouth daily.   Current Outpatient Medications (Analgesics):  .  aspirin 81 MG tablet, Take 81 mg by mouth daily.   Current Outpatient Medications (Other):  Marland Kitchen  Carboxymethylcellulose Sodium (EYE DROPS OP), Place 1 drop into both eyes daily as needed (dry eyes). Adora Fridge VAGINAL 0.1 MG/GM vaginal cream,  .  gabapentin (NEURONTIN) 100 MG capsule, TAKE 2 CAPSULES BY MOUTH AT BEDTIME .  hydroxychloroquine (PLAQUENIL) 200 MG tablet, Take 400 mg by mouth daily.  .  Misc Natural Products (TART CHERRY ADVANCED PO), Take 1 tablet by mouth daily. .  Multiple Vitamins-Calcium (ONE-A-DAY WOMENS PO), Take 1 tablet by mouth daily. .  potassium chloride (K-DUR,KLOR-CON) 10 MEQ tablet, Take 10 mEq by mouth daily. .  traZODone (DESYREL) 50 MG tablet, Take 50-150 mg by mouth at bedtime.  .  TURMERIC PO, Take 1 tablet by mouth daily. .  Vitamin D, Ergocalciferol, (DRISDOL) 1.25 MG (50000 UT) CAPS capsule, TAKE ONE CAPSULE BY MOUTH ONCE A WEEK    Past medical history, social, surgical and family history all reviewed in electronic medical record.  No pertanent information unless stated regarding to the chief complaint.   Review of Systems:  No headache, visual changes, nausea, vomiting, diarrhea, constipation, dizziness, abdominal pain, skin rash, fevers, chills, night sweats, weight loss, swollen lymph nodes, body aches, joint swelling, , chest pain, shortness of breath, mood changes.  Positive muscle aches  Objective  Blood pressure 132/70, pulse 83, height 5\' 6"  (1.676 m), weight 132 lb (59.9 kg), SpO2 95 %.    General: No apparent distress alert and oriented x3 mood and affect normal, dressed appropriately.  HEENT: Pupils equal, extraocular movements intact    Respiratory: Patient's speak in full sentences and does not appear short of breath  Cardiovascular: No lower extremity edema, non tender, no erythema  Skin: Warm dry intact with no signs of infection or rash on extremities or on axial skeleton.  Abdomen: Soft nontender  Neuro: Cranial nerves II through XII are intact, neurovascularly intact in all extremities with 2+ DTRs and 2+ pulses.  Lymph: No lymphadenopathy of posterior or anterior cervical chain or axillae bilaterally.  Gait normal with good balance and coordination.  MSK:  Non tender with full range of motion and good stability and symmetric strength and tone of shoulders, elbows, wrist, hip, knee and ankles bilaterally.  Neck  exam does have significant degenerative scoliosis noted.  Patient does have tightness more on the right than the left of the paraspinal musculature.  Near full range of motion noted.  Full strength in lower extremities.  Osteopathic findings C5 flexed rotated and side bent left T3 extended rotated and side bent right inhaled third rib T9 extended rotated and side bent left L3 flexed rotated and side bent right Sacrum right on right     Impression and Recommendations:     This case required medical decision making of moderate complexity. The above documentation has been reviewed and is accurate and complete Lyndal Pulley, DO       Note: This dictation was prepared with Dragon dictation along with smaller phrase technology. Any transcriptional errors that result from this process are unintentional.

## 2018-06-15 ENCOUNTER — Ambulatory Visit: Payer: PPO | Admitting: Family Medicine

## 2018-06-15 ENCOUNTER — Encounter: Payer: Self-pay | Admitting: Family Medicine

## 2018-06-15 VITALS — BP 132/70 | HR 83 | Ht 66.0 in | Wt 132.0 lb

## 2018-06-15 DIAGNOSIS — M48061 Spinal stenosis, lumbar region without neurogenic claudication: Secondary | ICD-10-CM

## 2018-06-15 DIAGNOSIS — M999 Biomechanical lesion, unspecified: Secondary | ICD-10-CM

## 2018-06-15 NOTE — Patient Instructions (Signed)
Great to see you  Gloria [name] is your friend Stay active I am proud of you  See me again in 4 weeks

## 2018-06-15 NOTE — Assessment & Plan Note (Signed)
Decision today to treat with OMT was based on Physical Exam  After verbal consent patient was treated with HVLA, ME, FPR techniques in cervical, thoracic, rib lumbar and sacral areas  Patient tolerated the procedure well with improvement in symptoms  Patient given exercises, stretches and lifestyle modifications  See medications in patient instructions if given  Patient will follow up in 4-8 weeks 

## 2018-06-15 NOTE — Assessment & Plan Note (Signed)
Known spinal stenosis with degenerative scoliosis.  I think this is contributing to most of it.  Underlying rheumatoid arthritis is also contributing but has done very well with conservative therapy.  Has been 4 weeks since we have seen the individual and will space out to 5 to 6-week intervals.  Continue with the same regimen otherwise.

## 2018-07-06 ENCOUNTER — Other Ambulatory Visit: Payer: Self-pay | Admitting: Family Medicine

## 2018-07-06 NOTE — Telephone Encounter (Signed)
Copied from [facility] [id number]. Topic: Quick Communication - Rx Refill/Question >> Jul 06, 2018 12:24 PM Bea Graff, NT wrote: Medication: Vitamin D, Ergocalciferol, (DRISDOL) 1.25 MG (50000 UT) CAPS capsule  Has the patient contacted their pharmacy? Yes.   (Agent: If no, request that the patient contact the pharmacy for the refill.) (Agent: If yes, when and what did the pharmacy advise?)  Preferred Pharmacy (with phone number or street name): [id number] - [place], [place] - [address] [phone] (Phone) [phone] (Fax)    Agent: Please be advised that RX refills may take up to 3 business days. We ask that you follow-up with your pharmacy.

## 2018-07-06 NOTE — Telephone Encounter (Signed)
Drisdol capsule refill request  Pt of Dr. Hulan Saas

## 2018-07-12 MED ORDER — VITAMIN D (ERGOCALCIFEROL) 1.25 MG (50000 UNIT) PO CAPS: 50000.0000 [IU] | ORAL_CAPSULE | ORAL | 0 refills | Status: DC

## 2018-07-12 NOTE — Telephone Encounter (Signed)
Refill done.  

## 2018-07-13 ENCOUNTER — Encounter: Payer: Self-pay | Admitting: Family Medicine

## 2018-07-13 ENCOUNTER — Ambulatory Visit: Payer: PPO | Admitting: Family Medicine

## 2018-07-13 VITALS — BP 110/70 | HR 70 | Ht 66.0 in | Wt 131.0 lb

## 2018-07-13 DIAGNOSIS — M999 Biomechanical lesion, unspecified: Secondary | ICD-10-CM | POA: Diagnosis not present

## 2018-07-13 DIAGNOSIS — M069 Rheumatoid arthritis, unspecified: Secondary | ICD-10-CM

## 2018-07-13 NOTE — Patient Instructions (Signed)
Good to see you  G[name] is your friend when needed Watch the hip and should be better by Friday  See me again in 4 weeks

## 2018-07-13 NOTE — Assessment & Plan Note (Signed)
Patient does have history of rheumatoid arthritis, degenerative scoliosis of the lumbar spine as well.  Discussed with patient in great length about home exercise, core strengthening, stability exercises.  Patient will follow-up with me again in 4 to 8 weeks

## 2018-07-13 NOTE — Assessment & Plan Note (Signed)
Decision today to treat with OMT was based on Physical Exam  After verbal consent patient was treated with HVLA, ME, FPR techniques in cervical, thoracic, rib lumbar and sacral areas  Patient tolerated the procedure well with improvement in symptoms  Patient given exercises, stretches and lifestyle modifications  See medications in patient instructions if given  Patient will follow up in 4-8 weeks 

## 2018-07-13 NOTE — Progress Notes (Signed)
[name] [address], [place] [address] Phone: [phone] Subjective:   I Gloria [name] am serving as a Education administrator for Dr. Hulan Saas.   CC: Back pain follow-up  [id number]  Gloria [name] is a 73 y.o. female coming in with complaint of back pain. Patient states that she was on her feet a lot yesterday helping her sister. Last night lower right back pain.  Patient states that it does not hurt as much.     Past Medical History:  Diagnosis Date  . Anxiety   . Arthritis   . CKD (chronic kidney disease), stage III ([place])   . Complication of anesthesia 05/2014   had body shaking-observed over night-see anesthesia note-has had tremors post op in past  . Depression   . Hyperlipemia   . Hypertension   . Insomnia    Past Surgical History:  Procedure Laterality Date  . ABDOMINAL HYSTERECTOMY     age 76  . BACK SURGERY    . CARPAL TUNNEL RELEASE  01/20/2012   Procedure: CARPAL TUNNEL RELEASE;  Surgeon: Wynonia Sours, MD;  Location: [facility];  Service: Orthopedics;  Laterality: Right;  . CARPAL TUNNEL RELEASE  02/25/2012   Procedure: CARPAL TUNNEL RELEASE;  Surgeon: Wynonia Sours, MD;  Location: [facility];  Service: Orthopedics;  Laterality: Left;  RELEASE A-1 PULLEY LRF  . COLONOSCOPY WITH PROPOFOL N/A 10/15/2016   Procedure: COLONOSCOPY WITH PROPOFOL;  Surgeon: Wilford Corner, MD;  Location: [facility];  Service: Endoscopy;  Laterality: N/A;  . DISTAL BICEPS TENDON REPAIR Left 06/12/2015   Procedure: REPAIR LEFT BICEPS TENDON AND DECOMPRESSION OF MEDIAN NERVE;  Surgeon: Daryll Brod, MD;  Location: [facility];  Service: Orthopedics;  Laterality: Left;  . SMALL INTESTINE SURGERY    . SMALL INTESTINE SURGERY     has had 6 total bowel obstructions -mostlt adhesions-last 2009  . TONSILLECTOMY     age 21  . TRIGGER FINGER RELEASE  2/12   rt middle  . TRIGGER FINGER RELEASE  2010   lt middle  .  ULNAR NERVE TRANSPOSITION Right 05/11/2014   Procedure: DECOMPRESSION ULNAR NERVE RIGHT ELBOW ;  Surgeon: Daryll Brod, MD;  Location: [facility];  Service: Orthopedics;  Laterality: Right;  . ULNAR NERVE TRANSPOSITION Left 07/06/2014   Procedure: DECOMPRESSION  LEFT ULNAR NERVE;  Surgeon: Daryll Brod, MD;  Location: [facility];  Service: Orthopedics;  Laterality: Left;   Social History   Socioeconomic History  . Marital status: Married    Spouse name: Not on file  . Number of children: Not on file  . Years of education: Not on file  . Highest education level: Not on file  Occupational History  . Not on file  Social Needs  . Financial resource strain: Not on file  . Food insecurity:    Worry: Not on file    Inability: Not on file  . Transportation needs:    Medical: Not on file    Non-medical: Not on file  Tobacco Use  . Smoking status: Never Smoker  . Smokeless tobacco: Never Used  Substance and Sexual Activity  . Alcohol use: No  . Drug use: No  . Sexual activity: Not on file  Lifestyle  . Physical activity:    Days per week: Not on file    Minutes per session: Not on file  . Stress: Not on file  Relationships  .  Social connections:    Talks on phone: Not on file    Gets together: Not on file    Attends religious service: Not on file    Active member of club or organization: Not on file    Attends meetings of clubs or organizations: Not on file    Relationship status: Not on file  Other Topics Concern  . Not on file  Social History Narrative  . Not on file   Allergies  Allergen Reactions  . Versed [Midazolam] Other (See Comments)  . Diprivan [Propofol] Other (See Comments)    Uncontrolled shaking  . Fentanyl   . Sevoflurane   . Zofran [Ondansetron Hcl]    Family History  Problem Relation Age of Onset  . Heart failure Father     Current Outpatient Medications (Endocrine & Metabolic):  .  alendronate (FOSAMAX) 10 MG tablet, Take 10  mg by mouth daily before breakfast. Take with a full glass of water on an empty stomach.  Current Outpatient Medications (Cardiovascular):  .  hydrochlorothiazide (MICROZIDE) 12.5 MG capsule, Take 12.5 mg by mouth daily.   Current Outpatient Medications (Analgesics):  .  aspirin 81 MG tablet, Take 81 mg by mouth daily.   Current Outpatient Medications (Other):  Marland Kitchen  Carboxymethylcellulose Sodium (EYE DROPS OP), Place 1 drop into both eyes daily as needed (dry eyes). Adora Fridge VAGINAL 0.1 MG/GM vaginal cream,  .  gabapentin (NEURONTIN) 100 MG capsule, TAKE 2 CAPSULES BY MOUTH AT BEDTIME .  hydroxychloroquine (PLAQUENIL) 200 MG tablet, Take 400 mg by mouth daily.  .  Misc Natural Products (TART CHERRY ADVANCED PO), Take 1 tablet by mouth daily. .  Multiple Vitamins-Calcium (ONE-A-DAY WOMENS PO), Take 1 tablet by mouth daily. .  potassium chloride (K-DUR,KLOR-CON) 10 MEQ tablet, Take 10 mEq by mouth daily. .  traZODone (DESYREL) 50 MG tablet, Take 50-150 mg by mouth at bedtime.  .  TURMERIC PO, Take 1 tablet by mouth daily. .  Vitamin D, Ergocalciferol, (DRISDOL) 1.25 MG (50000 UT) CAPS capsule, Take 1 capsule (50,000 Units total) by mouth once a week.    Past medical history, social, surgical and family history all reviewed in electronic medical record.  No pertanent information unless stated regarding to the chief complaint.   Review of Systems:  No headache, visual changes, nausea, vomiting, diarrhea, constipation, dizziness, abdominal pain, skin rash, fevers, chills, night sweats, weight loss, swollen lymph nodes, body aches, joint swelling,  chest pain, shortness of breath, mood changes.  Positive muscle aches  Objective  Blood pressure 110/70, pulse 70, height 5\' 6"  (1.676 m), weight 131 lb (59.4 kg), SpO2 97 %.   General: No apparent distress alert and oriented x3 mood and affect normal, dressed appropriately.  HEENT: Pupils equal, extraocular movements intact  Respiratory:  Patient's speak in full sentences and does not appear short of breath  Cardiovascular: No lower extremity edema, non tender, no erythema  Skin: Warm dry intact with no signs of infection or rash on extremities or on axial skeleton.  Abdomen: Soft nontender  Neuro: Cranial nerves II through XII are intact, neurovascularly intact in all extremities with 2+ DTRs and 2+ pulses.  Lymph: No lymphadenopathy of posterior or anterior cervical chain or axillae bilaterally.  Gait normal with good balance and coordination.  MSK:  tender with full range of motion and good stability and symmetric strength and tone of shoulders, elbows, wrist, hip, knee and ankles bilaterally.  Severe arthritic changes of multiple joints Back exam: Patient  does have some degenerative scoliosis noted at this time.  Patient does have some tenderness to palpation the paraspinal musculature lumbar spine right greater than left.  Negative straight leg test but tightness in the hamstring which is new.  Tightness with Corky Sox test as well.  Osteopathic findings C4 flexed rotated and side bent left T3 extended rotated and side bent right inhaled third rib T9 extended rotated and side bent left L3 flexed rotated and side bent right Sacrum right on right    Impression and Recommendations:     This case required medical decision making of moderate complexity. The above documentation has been reviewed and is accurate and complete Lyndal Pulley, DO       Note: This dictation was prepared with Dragon dictation along with smaller phrase technology. Any transcriptional errors that result from this process are unintentional.

## 2018-07-21 DIAGNOSIS — Z79899 Other long term (current) drug therapy: Secondary | ICD-10-CM | POA: Diagnosis not present

## 2018-07-21 DIAGNOSIS — H16223 Keratoconjunctivitis sicca, not specified as Sjogren's, bilateral: Secondary | ICD-10-CM | POA: Diagnosis not present

## 2018-08-03 ENCOUNTER — Ambulatory Visit: Payer: PPO | Admitting: Family Medicine

## 2018-08-19 ENCOUNTER — Other Ambulatory Visit: Payer: Self-pay | Admitting: Family Medicine

## 2018-08-20 ENCOUNTER — Ambulatory Visit: Payer: PPO | Admitting: Family Medicine

## 2018-08-30 DIAGNOSIS — S70361A Insect bite (nonvenomous), right thigh, initial encounter: Secondary | ICD-10-CM | POA: Diagnosis not present

## 2018-08-30 DIAGNOSIS — W57XXXA Bitten or stung by nonvenomous insect and other nonvenomous arthropods, initial encounter: Secondary | ICD-10-CM | POA: Diagnosis not present

## 2018-09-04 NOTE — Progress Notes (Signed)
[name] [place], [place] [address] Phone: [phone] Subjective:   I [name] am serving as a Education administrator for Dr. Hulan Saas.     CC: Back pain follow-up  [id number]  Gloria [name] is a 73 y.o. female coming in with complaint of back pain. Back has been bothering her. Has been doing more outdoor work. Right shoulder is bothering her the most.   Known degenerative scoliosis.  Has responded fairly well to osteopathic manipulation.  Has been doing more activity outside.  Patient denies any numbness or tingling.     Past Medical History:  Diagnosis Date  . Anxiety   . Arthritis   . CKD (chronic kidney disease), stage III ([place])   . Complication of anesthesia 05/2014   had body shaking-observed over night-see anesthesia note-has had tremors post op in past  . Depression   . Hyperlipemia   . Hypertension   . Insomnia    Past Surgical History:  Procedure Laterality Date  . ABDOMINAL HYSTERECTOMY     age 89  . BACK SURGERY    . CARPAL TUNNEL RELEASE  01/20/2012   Procedure: CARPAL TUNNEL RELEASE;  Surgeon: Wynonia Sours, MD;  Location: [facility];  Service: Orthopedics;  Laterality: Right;  . CARPAL TUNNEL RELEASE  02/25/2012   Procedure: CARPAL TUNNEL RELEASE;  Surgeon: Wynonia Sours, MD;  Location: [facility];  Service: Orthopedics;  Laterality: Left;  RELEASE A-1 PULLEY LRF  . COLONOSCOPY WITH PROPOFOL N/A 10/15/2016   Procedure: COLONOSCOPY WITH PROPOFOL;  Surgeon: Wilford Corner, MD;  Location: [facility];  Service: Endoscopy;  Laterality: N/A;  . DISTAL BICEPS TENDON REPAIR Left 06/12/2015   Procedure: REPAIR LEFT BICEPS TENDON AND DECOMPRESSION OF MEDIAN NERVE;  Surgeon: Daryll Brod, MD;  Location: [facility];  Service: Orthopedics;  Laterality: Left;  . SMALL INTESTINE SURGERY    . SMALL INTESTINE SURGERY     has had 6 total bowel obstructions -mostlt adhesions-last 2009  .  TONSILLECTOMY     age 37  . TRIGGER FINGER RELEASE  2/12   rt middle  . TRIGGER FINGER RELEASE  2010   lt middle  . ULNAR NERVE TRANSPOSITION Right 05/11/2014   Procedure: DECOMPRESSION ULNAR NERVE RIGHT ELBOW ;  Surgeon: Daryll Brod, MD;  Location: [facility];  Service: Orthopedics;  Laterality: Right;  . ULNAR NERVE TRANSPOSITION Left 07/06/2014   Procedure: DECOMPRESSION  LEFT ULNAR NERVE;  Surgeon: Daryll Brod, MD;  Location: [facility];  Service: Orthopedics;  Laterality: Left;   Social History   Socioeconomic History  . Marital status: Married    Spouse name: Not on file  . Number of children: Not on file  . Years of education: Not on file  . Highest education level: Not on file  Occupational History  . Not on file  Social Needs  . Financial resource strain: Not on file  . Food insecurity:    Worry: Not on file    Inability: Not on file  . Transportation needs:    Medical: Not on file    Non-medical: Not on file  Tobacco Use  . Smoking status: Never Smoker  . Smokeless tobacco: Never Used  Substance and Sexual Activity  . Alcohol use: No  . Drug use: No  . Sexual activity: Not on file  Lifestyle  . Physical activity:    Days per week: Not on file  Minutes per session: Not on file  . Stress: Not on file  Relationships  . Social connections:    Talks on phone: Not on file    Gets together: Not on file    Attends religious service: Not on file    Active member of club or organization: Not on file    Attends meetings of clubs or organizations: Not on file    Relationship status: Not on file  Other Topics Concern  . Not on file  Social History Narrative  . Not on file   Allergies  Allergen Reactions  . Versed [Midazolam] Other (See Comments)  . Diprivan [Propofol] Other (See Comments)    Uncontrolled shaking  . Fentanyl   . Sevoflurane   . Zofran [Ondansetron Hcl]    Family History  Problem Relation Age of Onset  . Heart  failure Father     Current Outpatient Medications (Endocrine & Metabolic):  .  alendronate (FOSAMAX) 10 MG tablet, Take 10 mg by mouth daily before breakfast. Take with a full glass of water on an empty stomach.  Current Outpatient Medications (Cardiovascular):  .  hydrochlorothiazide (MICROZIDE) 12.5 MG capsule, Take 12.5 mg by mouth daily.   Current Outpatient Medications (Analgesics):  .  aspirin 81 MG tablet, Take 81 mg by mouth daily.   Current Outpatient Medications (Other):  Marland Kitchen  Carboxymethylcellulose Sodium (EYE DROPS OP), Place 1 drop into both eyes daily as needed (dry eyes). Adora Fridge VAGINAL 0.1 MG/GM vaginal cream,  .  gabapentin (NEURONTIN) 100 MG capsule, TAKE 2 CAPSULES BY MOUTH AT BEDTIME .  hydroxychloroquine (PLAQUENIL) 200 MG tablet, Take 400 mg by mouth daily.  .  Misc Natural Products (TART CHERRY ADVANCED PO), Take 1 tablet by mouth daily. .  Multiple Vitamins-Calcium (ONE-A-DAY WOMENS PO), Take 1 tablet by mouth daily. .  potassium chloride (K-DUR,KLOR-CON) 10 MEQ tablet, Take 10 mEq by mouth daily. .  traZODone (DESYREL) 50 MG tablet, Take 50-150 mg by mouth at bedtime.  .  TURMERIC PO, Take 1 tablet by mouth daily. .  Vitamin D, Ergocalciferol, (DRISDOL) 1.25 MG (50000 UT) CAPS capsule, Take 1 capsule (50,000 Units total) by mouth once a week.    Past medical history, social, surgical and family history all reviewed in electronic medical record.  No pertanent information unless stated regarding to the chief complaint.   Review of Systems:  No headache, visual changes, nausea, vomiting, diarrhea, constipation, dizziness, abdominal pain, skin rash, fevers, chills, night sweats, weight loss, swollen lymph nodes, body aches, joint swelling, chest pain, shortness of breath, mood changes.  Positive muscle aches  Objective  Blood pressure 140/70, pulse 80, height 5\' 6"  (1.676 m), weight 129 lb (58.5 kg), SpO2 98 %.     General: No apparent distress alert and  oriented x3 mood and affect normal, dressed appropriately.  HEENT: Pupils equal, extraocular movements intact  Respiratory: Patient's speak in full sentences and does not appear short of breath  Cardiovascular: No lower extremity edema, non tender, no erythema  Skin: Warm dry intact with no signs of infection or rash on extremities or on axial skeleton.  Abdomen: Soft nontender  Neuro: Cranial nerves II through XII are intact, neurovascularly intact in all extremities with 2+ DTRs and 2+ pulses.  Lymph: No lymphadenopathy of posterior or anterior cervical chain or axillae bilaterally.  Gait normal with good balance and coordination.  MSK:  Non tender with full range of motion and good stability and symmetric strength and tone of  shoulders, elbows, wrist, hip, knee and ankles bilaterally.   Low back shows degenerative scoliosis of the back.  Tender to palpation more in the thoracolumbar as well as the lumbosacral areas bilaterally.  Patient does have more tightness of the [facility] test bilaterally than patient has had previously.  Negative straight leg test are noted.  Osteopathic findings  C6 flexed rotated and side bent left T3 extended rotated and side bent right inhaled third rib T7-T11 neutral rotated right side bent left  L2 flexed rotated and side bent right Sacrum right on right     Impression and Recommendations:     This case required medical decision making of moderate complexity. The above documentation has been reviewed and is accurate and complete Lyndal Pulley, DO       Note: This dictation was prepared with Dragon dictation along with smaller phrase technology. Any transcriptional errors that result from this process are unintentional.

## 2018-09-06 ENCOUNTER — Other Ambulatory Visit: Payer: Self-pay

## 2018-09-06 ENCOUNTER — Encounter: Payer: Self-pay | Admitting: Family Medicine

## 2018-09-06 ENCOUNTER — Ambulatory Visit: Payer: PPO | Admitting: Family Medicine

## 2018-09-06 VITALS — BP 140/70 | HR 80 | Ht 66.0 in | Wt 129.0 lb

## 2018-09-06 DIAGNOSIS — M999 Biomechanical lesion, unspecified: Secondary | ICD-10-CM | POA: Diagnosis not present

## 2018-09-06 DIAGNOSIS — M48061 Spinal stenosis, lumbar region without neurogenic claudication: Secondary | ICD-10-CM

## 2018-09-06 NOTE — Assessment & Plan Note (Signed)
Degenerative lumbar spinal stenosis.  We discussed with patient in great length.  Patient does have some instability noted that can contribute symptoms.

## 2018-09-06 NOTE — Assessment & Plan Note (Signed)
Decision today to treat with OMT was based on Physical Exam  After verbal consent patient was treated with HVLA, ME, FPR techniques in cervical, thoracic, rib lumbar and sacral areas  Patient tolerated the procedure well with improvement in symptoms  Patient given exercises, stretches and lifestyle modifications  See medications in patient instructions if given  Patient will follow up in 4-7 weeks

## 2018-09-06 NOTE — Patient Instructions (Signed)
Good to see you  Ice is your friend BE SAFE See me again in 4-5 weeks

## 2018-09-28 ENCOUNTER — Other Ambulatory Visit: Payer: Self-pay | Admitting: Family Medicine

## 2018-10-05 ENCOUNTER — Ambulatory Visit (INDEPENDENT_AMBULATORY_CARE_PROVIDER_SITE_OTHER): Payer: PPO | Admitting: Family Medicine

## 2018-10-05 ENCOUNTER — Other Ambulatory Visit: Payer: Self-pay

## 2018-10-05 VITALS — Ht 66.0 in

## 2018-10-05 DIAGNOSIS — M48061 Spinal stenosis, lumbar region without neurogenic claudication: Secondary | ICD-10-CM | POA: Diagnosis not present

## 2018-10-05 DIAGNOSIS — M999 Biomechanical lesion, unspecified: Secondary | ICD-10-CM | POA: Diagnosis not present

## 2018-10-05 NOTE — Assessment & Plan Note (Signed)
Contributing significantly.  We discussed with patient in great length about icing regimen and home exercises.  We discussed with patient about posture and ergonomics.  Patient's continues to do the home exercises and responded well to manipulation.  Follow-up again in 4 to 6 weeks.  Underlying rheumatoid arthritis is also likely contributing.

## 2018-10-05 NOTE — Assessment & Plan Note (Signed)
Decision today to treat with OMT was based on Physical Exam  After verbal consent patient was treated with HVLA, ME, FPR techniques in cervical, thoracic, rib lumbar and sacral areas  Patient tolerated the procedure well with improvement in symptoms  Patient given exercises, stretches and lifestyle modifications  See medications in patient instructions if given  Patient will follow up in 4-6 weeks 

## 2018-10-05 NOTE — Progress Notes (Signed)
[name] [place] [place], [place] [address] Phone: [phone] Subjective:     CC: Low back pain follow-up  [id number]  Gloria [name] is a 73 y.o. female coming in with complaint of low back pain.  Patient does have degenerative disc disease with scoliosis.  Patient has responded fairly well over the course of time of osteopathic manipulation for strengthening.  Patient continues to be fairly active overall.  No significant radiation of pain.  Does not use pain medications on a regular basis.  Able to still yard without any significant difficulty.     Past Medical History:  Diagnosis Date  . Anxiety   . Arthritis   . CKD (chronic kidney disease), stage III ([place])   . Complication of anesthesia 05/2014   had body shaking-observed over night-see anesthesia note-has had tremors post op in past  . Depression   . Hyperlipemia   . Hypertension   . Insomnia    Past Surgical History:  Procedure Laterality Date  . ABDOMINAL HYSTERECTOMY     age 13  . BACK SURGERY    . CARPAL TUNNEL RELEASE  01/20/2012   Procedure: CARPAL TUNNEL RELEASE;  Surgeon: Wynonia Sours, MD;  Location: [facility];  Service: Orthopedics;  Laterality: Right;  . CARPAL TUNNEL RELEASE  02/25/2012   Procedure: CARPAL TUNNEL RELEASE;  Surgeon: Wynonia Sours, MD;  Location: [facility];  Service: Orthopedics;  Laterality: Left;  RELEASE A-1 PULLEY LRF  . COLONOSCOPY WITH PROPOFOL N/A 10/15/2016   Procedure: COLONOSCOPY WITH PROPOFOL;  Surgeon: Wilford Corner, MD;  Location: [facility];  Service: Endoscopy;  Laterality: N/A;  . DISTAL BICEPS TENDON REPAIR Left 06/12/2015   Procedure: REPAIR LEFT BICEPS TENDON AND DECOMPRESSION OF MEDIAN NERVE;  Surgeon: Daryll Brod, MD;  Location: [facility];  Service: Orthopedics;  Laterality: Left;  . SMALL INTESTINE SURGERY    . SMALL INTESTINE SURGERY     has had 6 total bowel obstructions -mostlt  adhesions-last 2009  . TONSILLECTOMY     age 3  . TRIGGER FINGER RELEASE  2/12   rt middle  . TRIGGER FINGER RELEASE  2010   lt middle  . ULNAR NERVE TRANSPOSITION Right 05/11/2014   Procedure: DECOMPRESSION ULNAR NERVE RIGHT ELBOW ;  Surgeon: Daryll Brod, MD;  Location: [facility];  Service: Orthopedics;  Laterality: Right;  . ULNAR NERVE TRANSPOSITION Left 07/06/2014   Procedure: DECOMPRESSION  LEFT ULNAR NERVE;  Surgeon: Daryll Brod, MD;  Location: [facility];  Service: Orthopedics;  Laterality: Left;   Social History   Socioeconomic History  . Marital status: Married    Spouse name: Not on file  . Number of children: Not on file  . Years of education: Not on file  . Highest education level: Not on file  Occupational History  . Not on file  Social Needs  . Financial resource strain: Not on file  . Food insecurity:    Worry: Not on file    Inability: Not on file  . Transportation needs:    Medical: Not on file    Non-medical: Not on file  Tobacco Use  . Smoking status: Never Smoker  . Smokeless tobacco: Never Used  Substance and Sexual Activity  . Alcohol use: No  . Drug use: No  . Sexual activity: Not on file  Lifestyle  . Physical activity:    Days per week: Not on file  Minutes per session: Not on file  . Stress: Not on file  Relationships  . Social connections:    Talks on phone: Not on file    Gets together: Not on file    Attends religious service: Not on file    Active member of club or organization: Not on file    Attends meetings of clubs or organizations: Not on file    Relationship status: Not on file  Other Topics Concern  . Not on file  Social History Narrative  . Not on file   Allergies  Allergen Reactions  . Versed [Midazolam] Other (See Comments)  . Diprivan [Propofol] Other (See Comments)    Uncontrolled shaking  . Fentanyl   . Sevoflurane   . Zofran [Ondansetron Hcl]    Family History  Problem Relation Age  of Onset  . Heart failure Father     Current Outpatient Medications (Endocrine & Metabolic):  .  alendronate (FOSAMAX) 10 MG tablet, Take 10 mg by mouth daily before breakfast. Take with a full glass of water on an empty stomach.  Current Outpatient Medications (Cardiovascular):  .  hydrochlorothiazide (MICROZIDE) 12.5 MG capsule, Take 12.5 mg by mouth daily.   Current Outpatient Medications (Analgesics):  .  aspirin 81 MG tablet, Take 81 mg by mouth daily.   Current Outpatient Medications (Other):  Marland Kitchen  Carboxymethylcellulose Sodium (EYE DROPS OP), Place 1 drop into both eyes daily as needed (dry eyes). Adora Fridge VAGINAL 0.1 MG/GM vaginal cream,  .  gabapentin (NEURONTIN) 100 MG capsule, TAKE 2 CAPSULES BY MOUTH AT BEDTIME .  hydroxychloroquine (PLAQUENIL) 200 MG tablet, Take 400 mg by mouth daily.  .  Misc Natural Products (TART CHERRY ADVANCED PO), Take 1 tablet by mouth daily. .  Multiple Vitamins-Calcium (ONE-A-DAY WOMENS PO), Take 1 tablet by mouth daily. .  potassium chloride (K-DUR,KLOR-CON) 10 MEQ tablet, Take 10 mEq by mouth daily. .  traZODone (DESYREL) 50 MG tablet, Take 50-150 mg by mouth at bedtime.  .  TURMERIC PO, Take 1 tablet by mouth daily. .  Vitamin D, Ergocalciferol, (DRISDOL) 1.25 MG (50000 UT) CAPS capsule, TAKE 1 CAPSULE BY MOUTH ONE TIME PER WEEK    Past medical history, social, surgical and family history all reviewed in electronic medical record.  No pertanent information unless stated regarding to the chief complaint.   Review of Systems:  No headache, visual changes, nausea, vomiting, diarrhea, constipation, dizziness, abdominal pain, skin rash, fevers, chills, night sweats, weight loss, swollen lymph nodes, body aches, joint swelling, chest pain, shortness of breath, mood changes.  Positive muscle aches  Objective  Height 5\' 6"  (1.676 m).     General: No apparent distress alert and oriented x3 mood and affect normal, dressed appropriately.  HEENT:  Pupils equal, extraocular movements intact  Respiratory: Patient's speak in full sentences and does not appear short of breath  Cardiovascular: No lower extremity edema, non tender, no erythema  Skin: Warm dry intact with no signs of infection or rash on extremities or on axial skeleton.  Abdomen: Soft nontender  Neuro: Cranial nerves II through XII are intact, neurovascularly intact in all extremities with 2+ DTRs and 2+ pulses.  Lymph: No lymphadenopathy of posterior or anterior cervical chain or axillae bilaterally.  Gait normal with good balance and coordination.  MSK: Arthritic changes of multiple joints  Patient's back exam does have some degenerative scoliosis. Increasing tightness of the paraspinal musculature of the lumbar spine which is worse than previous exams.  Negative  straight leg test for some tightness in the hamstrings bilaterally.  Mild positive Corky Sox test on the right.  Neurovascular intact distally.  5 out of 5 strength of the lower extremities bilaterally  Osteopathic findings C5 flexed rotated and side bent left T7-T11 neutral rotated right side bent left inhaled seventh rib L2 flexed rotated and side bent left Sacrum right on right   Impression and Recommendations:     This case required medical decision making of moderate complexity. The above documentation has been reviewed and is accurate and complete Lyndal Pulley, DO       Note: This dictation was prepared with Dragon dictation along with smaller phrase technology. Any transcriptional errors that result from this process are unintentional.

## 2018-10-05 NOTE — Patient Instructions (Addendum)
Follow up in 4 weeks One knee down one knee up tilt pelvis forward.  Hands overhead then rotate to upper leg.  Hold 10 seconds, relax, repeat and do other side as well.  Very important when after being in a flexed position for a long amount of time.

## 2018-11-02 ENCOUNTER — Encounter: Payer: Self-pay | Admitting: Family Medicine

## 2018-11-02 ENCOUNTER — Other Ambulatory Visit: Payer: Self-pay

## 2018-11-02 ENCOUNTER — Ambulatory Visit: Payer: PPO | Admitting: Family Medicine

## 2018-11-02 VITALS — BP 148/90 | HR 77 | Ht 66.0 in | Wt 130.0 lb

## 2018-11-02 DIAGNOSIS — M48061 Spinal stenosis, lumbar region without neurogenic claudication: Secondary | ICD-10-CM | POA: Diagnosis not present

## 2018-11-02 DIAGNOSIS — M999 Biomechanical lesion, unspecified: Secondary | ICD-10-CM | POA: Diagnosis not present

## 2018-11-02 NOTE — Assessment & Plan Note (Signed)
Known spinal stenosis.  Responding fairly well to conservative therapy including the osteopathic manipulation.  No significant changes in management at this time.  Follow-up with me again in 4 to 6 weeks

## 2018-11-02 NOTE — Assessment & Plan Note (Signed)
Decision today to treat with OMT was based on Physical Exam  After verbal consent patient was treated with HVLA, ME, FPR techniques in cervical, thoracic, rib lumbar and sacral areas  Patient tolerated the procedure well with improvement in symptoms  Patient given exercises, stretches and lifestyle modifications  See medications in patient instructions if given  Patient will follow up in 4-6 weeks 

## 2018-11-02 NOTE — Progress Notes (Signed)
[name] [place],  [address] Phone: [phone] Subjective:   I [name] am serving as a Education administrator for Dr. Hulan Saas.   CC: Back pain follow-up  [id number]  Gloria [name] is a 73 y.o. female coming in with complaint of back pain. States she is doing well. Believe she is out of alignment.  Known degenerative scoliosis.  Patient continues to try to workout on a regular basis.  Tries to avoid as many medications that she can but has noted some recently.  No radiation down the legs no numbness or tingling     Past Medical History:  Diagnosis Date  . Anxiety   . Arthritis   . CKD (chronic kidney disease), stage III ([place])   . Complication of anesthesia 05/2014   had body shaking-observed over night-see anesthesia note-has had tremors post op in past  . Depression   . Hyperlipemia   . Hypertension   . Insomnia    Past Surgical History:  Procedure Laterality Date  . ABDOMINAL HYSTERECTOMY     age 28  . BACK SURGERY    . CARPAL TUNNEL RELEASE  01/20/2012   Procedure: CARPAL TUNNEL RELEASE;  Surgeon: Wynonia Sours, MD;  Location: [facility];  Service: Orthopedics;  Laterality: Right;  . CARPAL TUNNEL RELEASE  02/25/2012   Procedure: CARPAL TUNNEL RELEASE;  Surgeon: Wynonia Sours, MD;  Location: [facility];  Service: Orthopedics;  Laterality: Left;  RELEASE A-1 PULLEY LRF  . COLONOSCOPY WITH PROPOFOL N/A 10/15/2016   Procedure: COLONOSCOPY WITH PROPOFOL;  Surgeon: Wilford Corner, MD;  Location: [facility];  Service: Endoscopy;  Laterality: N/A;  . DISTAL BICEPS TENDON REPAIR Left 06/12/2015   Procedure: REPAIR LEFT BICEPS TENDON AND DECOMPRESSION OF MEDIAN NERVE;  Surgeon: Daryll Brod, MD;  Location: [facility];  Service: Orthopedics;  Laterality: Left;  . SMALL INTESTINE SURGERY    . SMALL INTESTINE SURGERY     has had 6 total bowel obstructions -mostlt adhesions-last 2009  .  TONSILLECTOMY     age 80  . TRIGGER FINGER RELEASE  2/12   rt middle  . TRIGGER FINGER RELEASE  2010   lt middle  . ULNAR NERVE TRANSPOSITION Right 05/11/2014   Procedure: DECOMPRESSION ULNAR NERVE RIGHT ELBOW ;  Surgeon: Daryll Brod, MD;  Location: [facility];  Service: Orthopedics;  Laterality: Right;  . ULNAR NERVE TRANSPOSITION Left 07/06/2014   Procedure: DECOMPRESSION  LEFT ULNAR NERVE;  Surgeon: Daryll Brod, MD;  Location: [facility];  Service: Orthopedics;  Laterality: Left;   Social History   Socioeconomic History  . Marital status: Married    Spouse name: Not on file  . Number of children: Not on file  . Years of education: Not on file  . Highest education level: Not on file  Occupational History  . Not on file  Social Needs  . Financial resource strain: Not on file  . Food insecurity    Worry: Not on file    Inability: Not on file  . Transportation needs    Medical: Not on file    Non-medical: Not on file  Tobacco Use  . Smoking status: Never Smoker  . Smokeless tobacco: Never Used  Substance and Sexual Activity  . Alcohol use: No  . Drug use: No  . Sexual activity: Not on file  Lifestyle  . Physical activity    Days per week: Not  on file    Minutes per session: Not on file  . Stress: Not on file  Relationships  . Social Herbalist on phone: Not on file    Gets together: Not on file    Attends religious service: Not on file    Active member of club or organization: Not on file    Attends meetings of clubs or organizations: Not on file    Relationship status: Not on file  Other Topics Concern  . Not on file  Social History Narrative  . Not on file   Allergies  Allergen Reactions  . Versed [Midazolam] Other (See Comments)  . Diprivan [Propofol] Other (See Comments)    Uncontrolled shaking  . Fentanyl   . Sevoflurane   . Zofran [Ondansetron Hcl]    Family History  Problem Relation Age of Onset  . Heart failure  Father     Current Outpatient Medications (Endocrine & Metabolic):  .  alendronate (FOSAMAX) 10 MG tablet, Take 10 mg by mouth daily before breakfast. Take with a full glass of water on an empty stomach.  Current Outpatient Medications (Cardiovascular):  .  hydrochlorothiazide (MICROZIDE) 12.5 MG capsule, Take 12.5 mg by mouth daily.   Current Outpatient Medications (Analgesics):  .  aspirin 81 MG tablet, Take 81 mg by mouth daily.   Current Outpatient Medications (Other):  Marland Kitchen  Carboxymethylcellulose Sodium (EYE DROPS OP), Place 1 drop into both eyes daily as needed (dry eyes). Adora Fridge VAGINAL 0.1 MG/GM vaginal cream,  .  gabapentin (NEURONTIN) 100 MG capsule, TAKE 2 CAPSULES BY MOUTH AT BEDTIME .  hydroxychloroquine (PLAQUENIL) 200 MG tablet, Take 400 mg by mouth daily.  .  Misc Natural Products (TART CHERRY ADVANCED PO), Take 1 tablet by mouth daily. .  Multiple Vitamins-Calcium (ONE-A-DAY WOMENS PO), Take 1 tablet by mouth daily. .  potassium chloride (K-DUR,KLOR-CON) 10 MEQ tablet, Take 10 mEq by mouth daily. .  traZODone (DESYREL) 50 MG tablet, Take 50-150 mg by mouth at bedtime.  .  TURMERIC PO, Take 1 tablet by mouth daily. .  Vitamin D, Ergocalciferol, (DRISDOL) 1.25 MG (50000 UT) CAPS capsule, TAKE 1 CAPSULE BY MOUTH ONE TIME PER WEEK    Past medical history, social, surgical and family history all reviewed in electronic medical record.  No pertanent information unless stated regarding to the chief complaint.   Review of Systems:  No headache, visual changes, nausea, vomiting, diarrhea, constipation, dizziness, abdominal pain, skin rash, fevers, chills, night sweats, weight loss, swollen lymph nodes, body aches, joint swelling,chest pain, shortness of breath, mood changes.  Positive muscle aches  Objective  Blood pressure (!) 148/90, pulse 77, height 5\' 6"  (1.676 m), weight 130 lb (59 kg), SpO2 97 %.    General: No apparent distress alert and oriented x3 mood and  affect normal, dressed appropriately.  HEENT: Pupils equal, extraocular movements intact  Respiratory: Patient's speak in full sentences and does not appear short of breath  Cardiovascular: No lower extremity edema, non tender, no erythema  Skin: Warm dry intact with no signs of infection or rash on extremities or on axial skeleton.  Abdomen: Soft nontender  Neuro: Cranial nerves II through XII are intact, neurovascularly intact in all extremities with 2+ DTRs and 2+ pulses.  Lymph: No lymphadenopathy of posterior or anterior cervical chain or axillae bilaterally.  Gait normal with good balance and coordination.  MSK:  Non tender with full range of motion and good stability and symmetric strength and  tone of shoulders, elbows, wrist, hip, knee and ankles bilaterally.  Arthritic changes of multiple joints  Low back exam shows the patient does have severe degenerative scoliosis noted.  Tightness in the paraspinal musculature lumbar spine right greater than left.  Tightness of the right sacroiliac joint.  Positive Corky Sox.  Range of motion shows the patient has very limited   Osteopathic findings  C6 flexed rotated and side bent left T5 extended rotated and side bent right inhaled rib L1 flexed rotated and side bent right Sacrum right on right  extension of the back.  Tightness of the hamstrings bilaterally.   Impression and Recommendations:     This case required medical decision making of moderate complexity. The above documentation has been reviewed and is accurate and complete Lyndal Pulley, DO       Note: This dictation was prepared with Dragon dictation along with smaller phrase technology. Any transcriptional errors that result from this process are unintentional.

## 2018-11-02 NOTE — Patient Instructions (Addendum)
If working outside try gatorade instead of water See me in 4-6 weeks

## 2018-11-03 ENCOUNTER — Telehealth: Payer: Self-pay | Admitting: Family Medicine

## 2018-11-03 NOTE — Telephone Encounter (Signed)
Samples only are here. Can leave some upfront  Or she can try Voltaren gel whicih became over the counter in the lasst month.

## 2018-11-03 NOTE — Telephone Encounter (Signed)
Pt states she forgot samples from Dr [name] yesterday for RA pain in her hands.  Could Dr [name] call in something for her to:   [id number] - [place], [place] [phone] (Phone) [phone] (Fax)

## 2018-11-04 MED ORDER — DICLOFENAC SODIUM 1 % TD GEL: 2.0000 g | Freq: Four times a day (QID) | TRANSDERMAL | 1 refills | Status: DC

## 2018-11-04 NOTE — Telephone Encounter (Signed)
Discussed with pt. Sent voltaren gel into pharmacy.

## 2018-11-08 DIAGNOSIS — M2550 Pain in unspecified joint: Secondary | ICD-10-CM | POA: Diagnosis not present

## 2018-11-08 DIAGNOSIS — M150 Primary generalized (osteo)arthritis: Secondary | ICD-10-CM | POA: Diagnosis not present

## 2018-11-08 DIAGNOSIS — M0609 Rheumatoid arthritis without rheumatoid factor, multiple sites: Secondary | ICD-10-CM | POA: Diagnosis not present

## 2018-11-08 DIAGNOSIS — Z79899 Other long term (current) drug therapy: Secondary | ICD-10-CM | POA: Diagnosis not present

## 2018-11-30 ENCOUNTER — Other Ambulatory Visit: Payer: Self-pay

## 2018-11-30 ENCOUNTER — Encounter: Payer: Self-pay | Admitting: Family Medicine

## 2018-11-30 ENCOUNTER — Ambulatory Visit: Payer: PPO | Admitting: Family Medicine

## 2018-11-30 VITALS — BP 150/90 | HR 74 | Ht 66.0 in | Wt 131.0 lb

## 2018-11-30 DIAGNOSIS — M999 Biomechanical lesion, unspecified: Secondary | ICD-10-CM

## 2018-11-30 DIAGNOSIS — M48061 Spinal stenosis, lumbar region without neurogenic claudication: Secondary | ICD-10-CM

## 2018-11-30 NOTE — Progress Notes (Signed)
[name] [place], [place] [address] Phone: [phone] Subjective:   I Gloria [name] am serving as a Education administrator for Dr. Hulan Saas.  I'm seeing this patient by the request  of:    CC: Back pain follow-up  [id number]  Gloria [name] is a 73 y.o. female coming in with complaint of back pain. States her back was bothering her a lot Sunday and Monday due to helping her sister move.  Patient since then is overall just more stiffness.  Nothing that seems to be too severe that stopping him from activities.  Patient denies any radiation down the legs at the moment.     Past Medical History:  Diagnosis Date  . Anxiety   . Arthritis   . CKD (chronic kidney disease), stage III ([place])   . Complication of anesthesia 05/2014   had body shaking-observed over night-see anesthesia note-has had tremors post op in past  . Depression   . Hyperlipemia   . Hypertension   . Insomnia    Past Surgical History:  Procedure Laterality Date  . ABDOMINAL HYSTERECTOMY     age 73  . BACK SURGERY    . CARPAL TUNNEL RELEASE  01/20/2012   Procedure: CARPAL TUNNEL RELEASE;  Surgeon: Wynonia Sours, MD;  Location: [facility];  Service: Orthopedics;  Laterality: Right;  . CARPAL TUNNEL RELEASE  02/25/2012   Procedure: CARPAL TUNNEL RELEASE;  Surgeon: Wynonia Sours, MD;  Location: [facility];  Service: Orthopedics;  Laterality: Left;  RELEASE A-1 PULLEY LRF  . COLONOSCOPY WITH PROPOFOL N/A 10/15/2016   Procedure: COLONOSCOPY WITH PROPOFOL;  Surgeon: Wilford Corner, MD;  Location: [facility];  Service: Endoscopy;  Laterality: N/A;  . DISTAL BICEPS TENDON REPAIR Left 06/12/2015   Procedure: REPAIR LEFT BICEPS TENDON AND DECOMPRESSION OF MEDIAN NERVE;  Surgeon: Daryll Brod, MD;  Location: [facility];  Service: Orthopedics;  Laterality: Left;  . SMALL INTESTINE SURGERY    . SMALL INTESTINE SURGERY     has had 6 total bowel  obstructions -mostlt adhesions-last 2009  . TONSILLECTOMY     age 3  . TRIGGER FINGER RELEASE  2/12   rt middle  . TRIGGER FINGER RELEASE  2010   lt middle  . ULNAR NERVE TRANSPOSITION Right 05/11/2014   Procedure: DECOMPRESSION ULNAR NERVE RIGHT ELBOW ;  Surgeon: Daryll Brod, MD;  Location: [facility];  Service: Orthopedics;  Laterality: Right;  . ULNAR NERVE TRANSPOSITION Left 07/06/2014   Procedure: DECOMPRESSION  LEFT ULNAR NERVE;  Surgeon: Daryll Brod, MD;  Location: [facility];  Service: Orthopedics;  Laterality: Left;   Social History   Socioeconomic History  . Marital status: Married    Spouse name: Not on file  . Number of children: Not on file  . Years of education: Not on file  . Highest education level: Not on file  Occupational History  . Not on file  Social Needs  . Financial resource strain: Not on file  . Food insecurity    Worry: Not on file    Inability: Not on file  . Transportation needs    Medical: Not on file    Non-medical: Not on file  Tobacco Use  . Smoking status: Never Smoker  . Smokeless tobacco: Never Used  Substance and Sexual Activity  . Alcohol use: No  . Drug use: No  . Sexual activity: Not on file  Lifestyle  .  Physical activity    Days per week: Not on file    Minutes per session: Not on file  . Stress: Not on file  Relationships  . Social Herbalist on phone: Not on file    Gets together: Not on file    Attends religious service: Not on file    Active member of club or organization: Not on file    Attends meetings of clubs or organizations: Not on file    Relationship status: Not on file  Other Topics Concern  . Not on file  Social History Narrative  . Not on file   Allergies  Allergen Reactions  . Versed [Midazolam] Other (See Comments)  . Diprivan [Propofol] Other (See Comments)    Uncontrolled shaking  . Fentanyl   . Sevoflurane   . Zofran [Ondansetron Hcl]    Family History   Problem Relation Age of Onset  . Heart failure Father     Current Outpatient Medications (Endocrine & Metabolic):  .  alendronate (FOSAMAX) 10 MG tablet, Take 10 mg by mouth daily before breakfast. Take with a full glass of water on an empty stomach.  Current Outpatient Medications (Cardiovascular):  .  hydrochlorothiazide (MICROZIDE) 12.5 MG capsule, Take 12.5 mg by mouth daily.   Current Outpatient Medications (Analgesics):  .  aspirin 81 MG tablet, Take 81 mg by mouth daily.   Current Outpatient Medications (Other):  Marland Kitchen  Carboxymethylcellulose Sodium (EYE DROPS OP), Place 1 drop into both eyes daily as needed (dry eyes). Marland Kitchen  diclofenac sodium (VOLTAREN) 1 % GEL, Apply 2 g topically 4 (four) times daily. Marland Kitchen  ESTRACE VAGINAL 0.1 MG/GM vaginal cream,  .  gabapentin (NEURONTIN) 100 MG capsule, TAKE 2 CAPSULES BY MOUTH AT BEDTIME .  hydroxychloroquine (PLAQUENIL) 200 MG tablet, Take 400 mg by mouth daily.  .  Misc Natural Products (TART CHERRY ADVANCED PO), Take 1 tablet by mouth daily. .  Multiple Vitamins-Calcium (ONE-A-DAY WOMENS PO), Take 1 tablet by mouth daily. .  potassium chloride (K-DUR,KLOR-CON) 10 MEQ tablet, Take 10 mEq by mouth daily. .  traZODone (DESYREL) 50 MG tablet, Take 50-150 mg by mouth at bedtime.  .  TURMERIC PO, Take 1 tablet by mouth daily. .  Vitamin D, Ergocalciferol, (DRISDOL) 1.25 MG (50000 UT) CAPS capsule, TAKE 1 CAPSULE BY MOUTH ONE TIME PER WEEK    Past medical history, social, surgical and family history all reviewed in electronic medical record.  No pertanent information unless stated regarding to the chief complaint.   Review of Systems:  No headache, visual changes, nausea, vomiting, diarrhea, constipation, dizziness, abdominal pain, skin rash, fevers, chills, night sweats, weight loss, swollen lymph nodes, body aches, joint swelling chest pain, shortness of breath, mood changes.  Positive muscle aches  Objective  Blood pressure (!) 150/90, pulse  74, height 5\' 6"  (1.676 m), weight 131 lb (59.4 kg), SpO2 97 %.    General: No apparent distress alert and oriented x3 mood and affect normal, dressed appropriately.  HEENT: Pupils equal, extraocular movements intact  Respiratory: Patient's speak in full sentences and does not appear short of breath  Cardiovascular: No lower extremity edema, non tender, no erythema  Skin: Warm dry intact with no signs of infection or rash on extremities or on axial skeleton.  Abdomen: Soft nontender  Neuro: Cranial nerves II through XII are intact, neurovascularly intact in all extremities with 2+ DTRs and 2+ pulses.  Lymph: No lymphadenopathy of posterior or anterior cervical chain or axillae  bilaterally.  Gait normal with good balance and coordination.  MSK:  Non tender with full range of motion and good stability and symmetric strength and tone of shoulders, elbows, wrist, hip, knee and ankles bilaterally.  Patient does have some degenerative scoliosis.  Some loss of lordosis.  Some tenderness noted.  Diffusely tender in the paraspinal musculature.  Does have some very mild tightness of the [facility] test on the right.  Negative straight leg test  Osteopathic findings  C4 flexed rotated and side bent left C6 flexed rotated and side bent left T3 extended rotated and side bent right inhaled third rib T7 extended rotated and side bent left L2 flexed rotated and side bent right Sacrum right on right    Impression and Recommendations:     This case required medical decision making of moderate complexity. The above documentation has been reviewed and is accurate and complete Lyndal Pulley, DO       Note: This dictation was prepared with Dragon dictation along with smaller phrase technology. Any transcriptional errors that result from this process are unintentional.

## 2018-11-30 NOTE — Assessment & Plan Note (Signed)
Degenerative lumbar spinal stenosis is likely contributing.  Patient is doing relatively well and we will continue with the osteopathic manipulation every 4 weeks as long as it continues to help.  Follow-up again in 4 weeks

## 2018-11-30 NOTE — Assessment & Plan Note (Signed)
Decision today to treat with OMT was based on Physical Exam  After verbal consent patient was treated with HVLA, ME, FPR techniques in cervical, thoracic, rib lumbar and sacral areas  Patient tolerated the procedure well with improvement in symptoms  Patient given exercises, stretches and lifestyle modifications  See medications in patient instructions if given  Patient will follow up in 4-8 weeks 

## 2018-11-30 NOTE — Patient Instructions (Signed)
Keep it up See me again in 4 weeks

## 2018-12-07 ENCOUNTER — Other Ambulatory Visit: Payer: Self-pay | Admitting: Family Medicine

## 2018-12-28 ENCOUNTER — Ambulatory Visit: Payer: PPO | Admitting: Family Medicine

## 2018-12-28 ENCOUNTER — Other Ambulatory Visit: Payer: Self-pay

## 2018-12-28 ENCOUNTER — Encounter: Payer: Self-pay | Admitting: Family Medicine

## 2018-12-28 VITALS — BP 124/74 | HR 75 | Ht 66.0 in | Wt 131.0 lb

## 2018-12-28 DIAGNOSIS — M48061 Spinal stenosis, lumbar region without neurogenic claudication: Secondary | ICD-10-CM | POA: Diagnosis not present

## 2018-12-28 DIAGNOSIS — M999 Biomechanical lesion, unspecified: Secondary | ICD-10-CM | POA: Diagnosis not present

## 2018-12-28 NOTE — Assessment & Plan Note (Signed)
Discussed HEP  Discussed which activities to do and which wants to avoid at the moment.  Has been responding fairly well to osteopathic manipulation.  Discussed posture and ergonomics, discussed which activities to do such as avoiding no repetitive activity such as extension and flexion.  Patient does have rheumatoid arthritis and will continue to monitor.  Follow-up again in 4 weeks

## 2018-12-28 NOTE — Assessment & Plan Note (Signed)
Decision today to treat with OMT was based on Physical Exam  After verbal consent patient was treated with HVLA, ME, FPR techniques in cervical, thoracic, rib, lumbar and sacral areas  Patient tolerated the procedure well with improvement in symptoms  Patient given exercises, stretches and lifestyle modifications  See medications in patient instructions if given  Patient will follow up in 4-6 weeks 

## 2018-12-28 NOTE — Progress Notes (Signed)
[name] [place], [place] [address] Phone: [phone] Subjective:   [name], am serving as a scribe for Dr. Hulan Saas.   CC: Back pain follow-up  [id number]  Gloria [name] is a 73 y.o. female coming in with complaint of back pain.  Patient does have back pain and has known degenerative scoliosis.  This causes some pain from time to time.  Has been increasing activity overall recently.  Patient describes it as a dull, throbbing aching pain.  Patient states that sometimes some very minimal radiation down the legs.  Continues to be very active.     Past Medical History:  Diagnosis Date  . Anxiety   . Arthritis   . CKD (chronic kidney disease), stage III ([place])   . Complication of anesthesia 05/2014   had body shaking-observed over night-see anesthesia note-has had tremors post op in past  . Depression   . Hyperlipemia   . Hypertension   . Insomnia    Past Surgical History:  Procedure Laterality Date  . ABDOMINAL HYSTERECTOMY     age 45  . BACK SURGERY    . CARPAL TUNNEL RELEASE  01/20/2012   Procedure: CARPAL TUNNEL RELEASE;  Surgeon: Wynonia Sours, MD;  Location: [facility];  Service: Orthopedics;  Laterality: Right;  . CARPAL TUNNEL RELEASE  02/25/2012   Procedure: CARPAL TUNNEL RELEASE;  Surgeon: Wynonia Sours, MD;  Location: [facility];  Service: Orthopedics;  Laterality: Left;  RELEASE A-1 PULLEY LRF  . COLONOSCOPY WITH PROPOFOL N/A 10/15/2016   Procedure: COLONOSCOPY WITH PROPOFOL;  Surgeon: Wilford Corner, MD;  Location: [facility];  Service: Endoscopy;  Laterality: N/A;  . DISTAL BICEPS TENDON REPAIR Left 06/12/2015   Procedure: REPAIR LEFT BICEPS TENDON AND DECOMPRESSION OF MEDIAN NERVE;  Surgeon: Daryll Brod, MD;  Location: [facility];  Service: Orthopedics;  Laterality: Left;  . SMALL INTESTINE SURGERY    . SMALL INTESTINE SURGERY     has had 6 total bowel  obstructions -mostlt adhesions-last 2009  . TONSILLECTOMY     age 74  . TRIGGER FINGER RELEASE  2/12   rt middle  . TRIGGER FINGER RELEASE  2010   lt middle  . ULNAR NERVE TRANSPOSITION Right 05/11/2014   Procedure: DECOMPRESSION ULNAR NERVE RIGHT ELBOW ;  Surgeon: Daryll Brod, MD;  Location: [facility];  Service: Orthopedics;  Laterality: Right;  . ULNAR NERVE TRANSPOSITION Left 07/06/2014   Procedure: DECOMPRESSION  LEFT ULNAR NERVE;  Surgeon: Daryll Brod, MD;  Location: [facility];  Service: Orthopedics;  Laterality: Left;   Social History   Socioeconomic History  . Marital status: Married    Spouse name: Not on file  . Number of children: Not on file  . Years of education: Not on file  . Highest education level: Not on file  Occupational History  . Not on file  Social Needs  . Financial resource strain: Not on file  . Food insecurity    Worry: Not on file    Inability: Not on file  . Transportation needs    Medical: Not on file    Non-medical: Not on file  Tobacco Use  . Smoking status: Never Smoker  . Smokeless tobacco: Never Used  Substance and Sexual Activity  . Alcohol use: No  . Drug use: No  . Sexual activity: Not on file  Lifestyle  . Physical activity  Days per week: Not on file    Minutes per session: Not on file  . Stress: Not on file  Relationships  . Social Herbalist on phone: Not on file    Gets together: Not on file    Attends religious service: Not on file    Active member of club or organization: Not on file    Attends meetings of clubs or organizations: Not on file    Relationship status: Not on file  Other Topics Concern  . Not on file  Social History Narrative  . Not on file   Allergies  Allergen Reactions  . Versed [Midazolam] Other (See Comments)  . Diprivan [Propofol] Other (See Comments)    Uncontrolled shaking  . Fentanyl   . Sevoflurane   . Zofran [Ondansetron Hcl]    Family History   Problem Relation Age of Onset  . Heart failure Father     Current Outpatient Medications (Endocrine & Metabolic):  .  alendronate (FOSAMAX) 10 MG tablet, Take 10 mg by mouth daily before breakfast. Take with a full glass of water on an empty stomach.  Current Outpatient Medications (Cardiovascular):  .  hydrochlorothiazide (MICROZIDE) 12.5 MG capsule, Take 12.5 mg by mouth daily.   Current Outpatient Medications (Analgesics):  .  aspirin 81 MG tablet, Take 81 mg by mouth daily.   Current Outpatient Medications (Other):  Marland Kitchen  Carboxymethylcellulose Sodium (EYE DROPS OP), Place 1 drop into both eyes daily as needed (dry eyes). Marland Kitchen  diclofenac sodium (VOLTAREN) 1 % GEL, Apply 2 g topically 4 (four) times daily. Marland Kitchen  ESTRACE VAGINAL 0.1 MG/GM vaginal cream,  .  gabapentin (NEURONTIN) 100 MG capsule, TAKE 2 CAPSULES BY MOUTH AT BEDTIME .  hydroxychloroquine (PLAQUENIL) 200 MG tablet, Take 400 mg by mouth daily.  .  Misc Natural Products (TART CHERRY ADVANCED PO), Take 1 tablet by mouth daily. .  Multiple Vitamins-Calcium (ONE-A-DAY WOMENS PO), Take 1 tablet by mouth daily. .  potassium chloride (K-DUR,KLOR-CON) 10 MEQ tablet, Take 10 mEq by mouth daily. .  traZODone (DESYREL) 50 MG tablet, Take 50-150 mg by mouth at bedtime.  .  TURMERIC PO, Take 1 tablet by mouth daily. .  Vitamin D, Ergocalciferol, (DRISDOL) 1.25 MG (50000 UT) CAPS capsule, TAKE 1 CAPSULE BY MOUTH ONE TIME PER WEEK    Past medical history, social, surgical and family history all reviewed in electronic medical record.  No pertanent information unless stated regarding to the chief complaint.   Review of Systems:  No headache, visual changes, nausea, vomiting, diarrhea, constipation, dizziness, abdominal pain, skin rash, fevers, chills, night sweats, weight loss, swollen lymph nodes, body aches, joint swelling, muscle aches, chest pain, shortness of breath, mood changes.   Objective  Blood pressure 124/74, pulse 75, height  5\' 6"  (1.676 m), weight 131 lb (59.4 kg), SpO2 98 %.   General: No apparent distress alert and oriented x3 mood and affect normal, dressed appropriately.  HEENT: Pupils equal, extraocular movements intact  Respiratory: Patient's speak in full sentences and does not appear short of breath  Cardiovascular: No lower extremity edema, non tender, no erythema  Skin: Warm dry intact with no signs of infection or rash on extremities or on axial skeleton.  Abdomen: Soft nontender  Neuro: Cranial nerves II through XII are intact, neurovascularly intact in all extremities with 2+ DTRs and 2+ pulses.  Lymph: No lymphadenopathy of posterior or anterior cervical chain or axillae bilaterally.  Gait normal with good balance and  coordination.  MSK:  Non tender with full range of motion and good stability and symmetric strength and tone of shoulders, elbows, wrist, hip, knee and ankles bilaterally.  Arthritic changes of multiple joints Patient does have some back pain, degenerative scoliosis, patient negative straight leg test but tightness of hamstrings.  Osteopathic findings C2 flexed rotated and side bent right C6 flexed rotated and side bent left T3 extended rotated and side bent right inhaled third rib T7 extended rotated and side bent left L4 flexed rotated and side bent lefft  Sacrum right on right    Impression and Recommendations:     This case required medical decision making of moderate complexity. The above documentation has been reviewed and is accurate and complete Lyndal Pulley, DO       Note: This dictation was prepared with Dragon dictation along with smaller phrase technology. Any transcriptional errors that result from this process are unintentional.

## 2018-12-28 NOTE — Patient Instructions (Signed)
See me again in 4 weeks  

## 2018-12-31 ENCOUNTER — Ambulatory Visit: Payer: PPO | Admitting: Family Medicine

## 2019-01-11 DIAGNOSIS — I10 Essential (primary) hypertension: Secondary | ICD-10-CM | POA: Diagnosis not present

## 2019-01-11 DIAGNOSIS — E559 Vitamin D deficiency, unspecified: Secondary | ICD-10-CM | POA: Diagnosis not present

## 2019-01-11 DIAGNOSIS — Z23 Encounter for immunization: Secondary | ICD-10-CM | POA: Diagnosis not present

## 2019-01-11 DIAGNOSIS — M810 Age-related osteoporosis without current pathological fracture: Secondary | ICD-10-CM | POA: Diagnosis not present

## 2019-01-11 DIAGNOSIS — E7800 Pure hypercholesterolemia, unspecified: Secondary | ICD-10-CM | POA: Diagnosis not present

## 2019-01-11 DIAGNOSIS — N952 Postmenopausal atrophic vaginitis: Secondary | ICD-10-CM | POA: Diagnosis not present

## 2019-01-26 ENCOUNTER — Encounter: Payer: Self-pay | Admitting: Family Medicine

## 2019-01-26 ENCOUNTER — Ambulatory Visit (INDEPENDENT_AMBULATORY_CARE_PROVIDER_SITE_OTHER)
Admission: RE | Admit: 2019-01-26 | Discharge: 2019-01-26 | Disposition: A | Discharge status: Home / Self Care | Payer: PPO | Source: Ambulatory Visit | Attending: Family Medicine | Admitting: Family Medicine

## 2019-01-26 ENCOUNTER — Ambulatory Visit: Payer: PPO | Admitting: Family Medicine

## 2019-01-26 ENCOUNTER — Other Ambulatory Visit: Payer: Self-pay

## 2019-01-26 VITALS — BP 132/88 | HR 81 | Ht 66.0 in | Wt 136.0 lb

## 2019-01-26 DIAGNOSIS — M545 Low back pain, unspecified: Secondary | ICD-10-CM

## 2019-01-26 DIAGNOSIS — M48061 Spinal stenosis, lumbar region without neurogenic claudication: Secondary | ICD-10-CM | POA: Diagnosis not present

## 2019-01-26 DIAGNOSIS — M5136 Other intervertebral disc degeneration, lumbar region: Secondary | ICD-10-CM | POA: Diagnosis not present

## 2019-01-26 DIAGNOSIS — M999 Biomechanical lesion, unspecified: Secondary | ICD-10-CM | POA: Diagnosis not present

## 2019-01-26 NOTE — Progress Notes (Signed)
[name] [place],  [address] Phone: [phone] Subjective:   Gloria [name], am serving as a scribe for Dr. Hulan Saas.   CC: Low back pain  [id number]   12/28/2018 Discussed HEP  Discussed which activities to do and which wants to avoid at the moment.  Has been responding fairly well to osteopathic manipulation.  Discussed posture and ergonomics, discussed which activities to do such as avoiding [name] repetitive activity such as extension and flexion.  Patient does have rheumatoid arthritis and will continue to monitor.  Follow-up again in 4 weeks  Update 01/26/2019 Gloria [name] is a 73 y.o. female coming in with complaint of back pain. Patient states that if she stands for too long her back starts to hurt. Patient has been having arthritic flares now for a couple of months especially in the morning.  Patient states that with some longstanding can have more discomfort as well.     Past Medical History:  Diagnosis Date  . Anxiety   . Arthritis   . CKD (chronic kidney disease), stage III ([place])   . Complication of anesthesia 05/2014   had body shaking-observed over night-see anesthesia note-has had tremors post op in past  . Depression   . Hyperlipemia   . Hypertension   . Insomnia    Past Surgical History:  Procedure Laterality Date  . ABDOMINAL HYSTERECTOMY     age 28  . BACK SURGERY    . CARPAL TUNNEL RELEASE  01/20/2012   Procedure: CARPAL TUNNEL RELEASE;  Surgeon: Wynonia Sours, MD;  Location: [facility];  Service: Orthopedics;  Laterality: Right;  . CARPAL TUNNEL RELEASE  02/25/2012   Procedure: CARPAL TUNNEL RELEASE;  Surgeon: Wynonia Sours, MD;  Location: [facility];  Service: Orthopedics;  Laterality: Left;  RELEASE A-1 PULLEY LRF  . COLONOSCOPY WITH PROPOFOL N/A 10/15/2016   Procedure: COLONOSCOPY WITH PROPOFOL;  Surgeon: Wilford Corner, MD;  Location: [facility];  Service:  Endoscopy;  Laterality: N/A;  . DISTAL BICEPS TENDON REPAIR Left 06/12/2015   Procedure: REPAIR LEFT BICEPS TENDON AND DECOMPRESSION OF MEDIAN NERVE;  Surgeon: Daryll Brod, MD;  Location: [facility];  Service: Orthopedics;  Laterality: Left;  . SMALL INTESTINE SURGERY    . SMALL INTESTINE SURGERY     has had 6 total bowel obstructions -mostlt adhesions-last 2009  . TONSILLECTOMY     age 49  . TRIGGER FINGER RELEASE  2/12   rt middle  . TRIGGER FINGER RELEASE  2010   lt middle  . ULNAR NERVE TRANSPOSITION Right 05/11/2014   Procedure: DECOMPRESSION ULNAR NERVE RIGHT ELBOW ;  Surgeon: Daryll Brod, MD;  Location: [facility];  Service: Orthopedics;  Laterality: Right;  . ULNAR NERVE TRANSPOSITION Left 07/06/2014   Procedure: DECOMPRESSION  LEFT ULNAR NERVE;  Surgeon: Daryll Brod, MD;  Location: [facility];  Service: Orthopedics;  Laterality: Left;   Social History   Socioeconomic History  . Marital status: Married    Spouse name: Not on file  . Number of children: Not on file  . Years of education: Not on file  . Highest education level: Not on file  Occupational History  . Not on file  Social Needs  . Financial resource strain: Not on file  . Food insecurity    Worry: Not on file    Inability: Not on file  . Transportation needs    Medical:  Not on file    Non-medical: Not on file  Tobacco Use  . Smoking status: Never Smoker  . Smokeless tobacco: Never Used  Substance and Sexual Activity  . Alcohol use: [name]  . Drug use: [name]  . Sexual activity: Not on file  Lifestyle  . Physical activity    Days per week: Not on file    Minutes per session: Not on file  . Stress: Not on file  Relationships  . Social Herbalist on phone: Not on file    Gets together: Not on file    Attends religious service: Not on file    Active member of club or organization: Not on file    Attends meetings of clubs or organizations: Not on file     Relationship status: Not on file  Other Topics Concern  . Not on file  Social History Narrative  . Not on file   Allergies  Allergen Reactions  . Versed [Midazolam] Other (See Comments)  . Diprivan [Propofol] Other (See Comments)    Uncontrolled shaking  . Fentanyl   . Sevoflurane   . Zofran [Ondansetron Hcl]    Family History  Problem Relation Age of Onset  . Heart failure Father     Current Outpatient Medications (Endocrine & Metabolic):  .  alendronate (FOSAMAX) 10 MG tablet, Take 10 mg by mouth daily before breakfast. Take with a full glass of water on an empty stomach.  Current Outpatient Medications (Cardiovascular):  .  hydrochlorothiazide (MICROZIDE) 12.5 MG capsule, Take 12.5 mg by mouth daily.   Current Outpatient Medications (Analgesics):  .  aspirin 81 MG tablet, Take 81 mg by mouth daily.   Current Outpatient Medications (Other):  Marland Kitchen  Carboxymethylcellulose Sodium (EYE DROPS OP), Place 1 drop into both eyes daily as needed (dry eyes). Marland Kitchen  diclofenac sodium (VOLTAREN) 1 % GEL, Apply 2 g topically 4 (four) times daily. Marland Kitchen  ESTRACE VAGINAL 0.1 MG/GM vaginal cream,  .  gabapentin (NEURONTIN) 100 MG capsule, TAKE 2 CAPSULES BY MOUTH AT BEDTIME .  hydroxychloroquine (PLAQUENIL) 200 MG tablet, Take 400 mg by mouth daily.  .  Misc Natural Products (TART CHERRY ADVANCED PO), Take 1 tablet by mouth daily. .  Multiple Vitamins-Calcium (ONE-A-DAY WOMENS PO), Take 1 tablet by mouth daily. .  potassium chloride (K-DUR,KLOR-CON) 10 MEQ tablet, Take 10 mEq by mouth daily. .  traZODone (DESYREL) 50 MG tablet, Take 50-150 mg by mouth at bedtime.  .  TURMERIC PO, Take 1 tablet by mouth daily. .  Vitamin D, Ergocalciferol, (DRISDOL) 1.25 MG (50000 UT) CAPS capsule, TAKE 1 CAPSULE BY MOUTH ONE TIME PER WEEK    Past medical history, social, surgical and family history all reviewed in electronic medical record.  [name] unless stated regarding to the chief complaint.    Review of Systems:  [name] headache, visual changes, nausea, vomiting, diarrhea, constipation, dizziness, abdominal pain, skin rash, fevers, chills, night sweats, weight loss, swollen lymph nodes, body aches, joint swelling, muscle aches, chest pain, shortness of breath, mood changes.   Objective  Blood pressure 132/88, pulse 81, height 5\' 6"  (1.676 m), weight 136 lb (61.7 kg), SpO2 97 %. Systems examined below as of    General: [name] apparent distress alert and oriented x3 mood and affect normal, dressed appropriately.  HEENT: Pupils equal, extraocular movements intact  Respiratory: Patient's speak in full sentences and does not appear short of breath  Cardiovascular: [name] lower extremity edema, non tender, [name] erythema  Skin: Warm dry intact with [name] signs of infection or rash on extremities or on axial skeleton.  Abdomen: Soft nontender  Neuro: Cranial nerves II through XII are intact, neurovascularly intact in all extremities with 2+ DTRs and 2+ pulses.  Lymph: [name] lymphadenopathy of posterior or anterior cervical chain or axillae bilaterally.  Gait normal with good balance and coordination.  MSK:  Non tender with full range of motion and good stability and symmetric strength and tone of shoulders, elbows, wrist, hip, knee and ankles bilaterally.  Low back exam does have some degenerative scoliosis noted with some loss of lordosis.  Tightness with the right Corky Sox test but [name] radicular symptoms.  Neurovascular intact distally.  5 out of 5 strength in lower extremities.  Osteopathic findings  C7 flexed rotated and side bent left T3 extended rotated and side bent right inhaled third rib T5 extended rotated and side bent left L2 flexed rotated and side bent right Sacrum right on right    Impression and Recommendations:     This case required medical decision making of moderate complexity. The above documentation has been reviewed and is accurate and complete Lyndal Pulley, DO        Note: This dictation was prepared with Dragon dictation along with smaller phrase technology. Any transcriptional errors that result from this process are unintentional.

## 2019-01-26 NOTE — Assessment & Plan Note (Signed)
Decision today to treat with OMT was based on Physical Exam  After verbal consent patient was treated with HVLA, ME, FPR techniques in cervical, thoracic, rib lumbar and sacral areas  Patient tolerated the procedure well with improvement in symptoms  Patient given exercises, stretches and lifestyle modifications  See medications in patient instructions if given  Patient will follow up in 6 weeks 

## 2019-01-26 NOTE — Patient Instructions (Signed)
Pennsaid 2x a day See me in 5 weeks

## 2019-01-26 NOTE — Assessment & Plan Note (Signed)
Moderate to severe.  Repeat injection is given today.  Discussed that we may need advanced imaging and injections if necessary.  Patient will increase activity as tolerated and as long as patient continues to respond well to osteopathic manipulation we will see patient in 6-week intervals

## 2019-01-27 DIAGNOSIS — N958 Other specified menopausal and perimenopausal disorders: Secondary | ICD-10-CM | POA: Diagnosis not present

## 2019-01-27 DIAGNOSIS — Z01419 Encounter for gynecological examination (general) (routine) without abnormal findings: Secondary | ICD-10-CM | POA: Diagnosis not present

## 2019-01-27 DIAGNOSIS — M8588 Other specified disorders of bone density and structure, other site: Secondary | ICD-10-CM | POA: Diagnosis not present

## 2019-01-27 DIAGNOSIS — Z1231 Encounter for screening mammogram for malignant neoplasm of breast: Secondary | ICD-10-CM | POA: Diagnosis not present

## 2019-01-27 DIAGNOSIS — Z6821 Body mass index (BMI) 21.0-21.9, adult: Secondary | ICD-10-CM | POA: Diagnosis not present

## 2019-01-28 ENCOUNTER — Telehealth: Payer: Self-pay | Admitting: *Deleted

## 2019-01-28 NOTE — Telephone Encounter (Signed)
Patient called for x-ray results from lumbar spine x-ray done on 01/26/2019.  Patient notified of results.

## 2019-02-10 ENCOUNTER — Other Ambulatory Visit: Payer: Self-pay | Admitting: Family Medicine

## 2019-02-24 ENCOUNTER — Other Ambulatory Visit: Payer: Self-pay | Admitting: Family Medicine

## 2019-03-02 ENCOUNTER — Other Ambulatory Visit: Payer: Self-pay

## 2019-03-02 ENCOUNTER — Ambulatory Visit: Payer: PPO | Admitting: Family Medicine

## 2019-03-02 ENCOUNTER — Encounter: Payer: Self-pay | Admitting: Family Medicine

## 2019-03-02 VITALS — BP 132/88 | HR 88 | Ht 66.0 in | Wt 134.0 lb

## 2019-03-02 DIAGNOSIS — M48061 Spinal stenosis, lumbar region without neurogenic claudication: Secondary | ICD-10-CM | POA: Diagnosis not present

## 2019-03-02 DIAGNOSIS — M999 Biomechanical lesion, unspecified: Secondary | ICD-10-CM

## 2019-03-02 NOTE — Patient Instructions (Signed)
Stable See me in 4-6 weeks

## 2019-03-02 NOTE — Progress Notes (Signed)
[name] [place], [place] [address] Phone: [phone] Subjective:   [name], am serving as a scribe for Dr. Hulan Saas.  I'm seeing this patient by the request  of:    CC: Back pain follow-up  [id number]  Monserath L Sheldon is a 73 y.o. female coming in with complaint of back pain. Last seen on 90/25/2020 for OMT. Patient states that she is having achiness in right glute intermittently.  Patient states that it still seems to be stable overall.  Never without pain but not stopping her from activities.    Past Medical History:  Diagnosis Date  . Anxiety   . Arthritis   . CKD (chronic kidney disease), stage III   . Complication of anesthesia 05/2014   had body shaking-observed over night-see anesthesia note-has had tremors post op in past  . Depression   . Hyperlipemia   . Hypertension   . Insomnia    Past Surgical History:  Procedure Laterality Date  . ABDOMINAL HYSTERECTOMY     age 73  . BACK SURGERY    . CARPAL TUNNEL RELEASE  01/20/2012   Procedure: CARPAL TUNNEL RELEASE;  Surgeon: Wynonia Sours, MD;  Location: [facility];  Service: Orthopedics;  Laterality: Right;  . CARPAL TUNNEL RELEASE  02/25/2012   Procedure: CARPAL TUNNEL RELEASE;  Surgeon: Wynonia Sours, MD;  Location: [facility];  Service: Orthopedics;  Laterality: Left;  RELEASE A-1 PULLEY LRF  . COLONOSCOPY WITH PROPOFOL N/A 10/15/2016   Procedure: COLONOSCOPY WITH PROPOFOL;  Surgeon: Wilford Corner, MD;  Location: [facility];  Service: Endoscopy;  Laterality: N/A;  . DISTAL BICEPS TENDON REPAIR Left 06/12/2015   Procedure: REPAIR LEFT BICEPS TENDON AND DECOMPRESSION OF MEDIAN NERVE;  Surgeon: Daryll Brod, MD;  Location: [facility];  Service: Orthopedics;  Laterality: Left;  . SMALL INTESTINE SURGERY    . SMALL INTESTINE SURGERY     has had 6 total bowel obstructions -mostlt adhesions-last 2009  . TONSILLECTOMY      age 73  . TRIGGER FINGER RELEASE  2/12   rt middle  . TRIGGER FINGER RELEASE  2010   lt middle  . ULNAR NERVE TRANSPOSITION Right 05/11/2014   Procedure: DECOMPRESSION ULNAR NERVE RIGHT ELBOW ;  Surgeon: Daryll Brod, MD;  Location: [facility];  Service: Orthopedics;  Laterality: Right;  . ULNAR NERVE TRANSPOSITION Left 07/06/2014   Procedure: DECOMPRESSION  LEFT ULNAR NERVE;  Surgeon: Daryll Brod, MD;  Location: [facility];  Service: Orthopedics;  Laterality: Left;   Social History   Socioeconomic History  . Marital status: Married    Spouse name: Not on file  . Number of children: Not on file  . Years of education: Not on file  . Highest education level: Not on file  Occupational History  . Not on file  Social Needs  . Financial resource strain: Not on file  . Food insecurity    Worry: Not on file    Inability: Not on file  . Transportation needs    Medical: Not on file    Non-medical: Not on file  Tobacco Use  . Smoking status: Never Smoker  . Smokeless tobacco: Never Used  Substance and Sexual Activity  . Alcohol use: No  . Drug use: No  . Sexual activity: Not on file  Lifestyle  . Physical activity    Days per week: Not on file  Minutes per session: Not on file  . Stress: Not on file  Relationships  . Social Herbalist on phone: Not on file    Gets together: Not on file    Attends religious service: Not on file    Active member of club or organization: Not on file    Attends meetings of clubs or organizations: Not on file    Relationship status: Not on file  Other Topics Concern  . Not on file  Social History Narrative  . Not on file   Allergies  Allergen Reactions  . Versed [Midazolam] Other (See Comments)  . Diprivan [Propofol] Other (See Comments)    Uncontrolled shaking  . Fentanyl   . Sevoflurane   . Zofran [Ondansetron Hcl]    Family History  Problem Relation Age of Onset  . Heart failure Father      Current Outpatient Medications (Endocrine & Metabolic):  .  alendronate (FOSAMAX) 10 MG tablet, Take 10 mg by mouth daily before breakfast. Take with a full glass of water on an empty stomach.  Current Outpatient Medications (Cardiovascular):  .  hydrochlorothiazide (MICROZIDE) 12.5 MG capsule, Take 12.5 mg by mouth daily.   Current Outpatient Medications (Analgesics):  .  aspirin 81 MG tablet, Take 81 mg by mouth daily.   Current Outpatient Medications (Other):  Marland Kitchen  Carboxymethylcellulose Sodium (EYE DROPS OP), Place 1 drop into both eyes daily as needed (dry eyes). Marland Kitchen  diclofenac sodium (VOLTAREN) 1 % GEL, Apply 2 g topically 4 (four) times daily. Marland Kitchen  ESTRACE VAGINAL 0.1 MG/GM vaginal cream,  .  gabapentin (NEURONTIN) 100 MG capsule, TAKE 2 CAPSULES BY MOUTH AT BEDTIME .  hydroxychloroquine (PLAQUENIL) 200 MG tablet, Take 400 mg by mouth daily.  .  Misc Natural Products (TART CHERRY ADVANCED PO), Take 1 tablet by mouth daily. .  Multiple Vitamins-Calcium (ONE-A-DAY WOMENS PO), Take 1 tablet by mouth daily. .  potassium chloride (K-DUR,KLOR-CON) 10 MEQ tablet, Take 10 mEq by mouth daily. .  traZODone (DESYREL) 50 MG tablet, Take 50-150 mg by mouth at bedtime.  .  TURMERIC PO, Take 1 tablet by mouth daily. .  Vitamin D, Ergocalciferol, (DRISDOL) 1.25 MG (50000 UT) CAPS capsule, TAKE 1 CAPSULE BY MOUTH ONE TIME PER WEEK    Past medical history, social, surgical and family history all reviewed in electronic medical record.  No pertanent information unless stated regarding to the chief complaint.   Review of Systems:  No headache, visual changes, nausea, vomiting, diarrhea, constipation, dizziness, abdominal pain, skin rash, fevers, chills, night sweats, weight loss, swollen lymph nodes, body aches, joint swelling, , chest pain, shortness of breath, mood changes.  Positive muscle aches  Objective  Blood pressure 132/88, pulse 88, height 5\' 6"  (1.676 m), weight 134 lb (60.8 kg), SpO2 98 %.     General: No apparent distress alert and oriented x3 mood and affect normal, dressed appropriately.  HEENT: Pupils equal, extraocular movements intact  Respiratory: Patient's speak in full sentences and does not appear short of breath  Cardiovascular: No lower extremity edema, non tender, no erythema  Skin: Warm dry intact with no signs of infection or rash on extremities or on axial skeleton.  Abdomen: Soft nontender  Neuro: Cranial nerves II through XII are intact, neurovascularly intact in all extremities with 2+ DTRs and 2+ pulses.  Lymph: No lymphadenopathy of posterior or anterior cervical chain or axillae bilaterally.  Gait normal with good balance and coordination.  MSK:  tender with  full range of motion and good stability and symmetric strength and tone of shoulders, elbows, wrist, hip, knee and ankles bilaterally.  Arthritic changes of multiple joints Back exam does show that patient does have some degenerative scoliosis noted.  Patient has some mild loss of lordosis, positive Faber on the right side.  Negative straight leg test.  Limited range of motion of 10 degrees in extension and sidebending bilaterally.  Osteopathic findings  C6 flexed rotated and side bent left T3 extended rotated and side bent right inhaled third rib T8 extended rotated and side bent left L5 flexed rotated and side bent left Sacrum right on right    Impression and Recommendations:     This case required medical decision making of moderate complexity. The above documentation has been reviewed and is accurate and complete Lyndal Pulley, DO       Note: This dictation was prepared with Dragon dictation along with smaller phrase technology. Any transcriptional errors that result from this process are unintentional.

## 2019-03-02 NOTE — Assessment & Plan Note (Signed)
Moderate to severe with history of rheumatoid arthritis, patient continues to have exacerbation from time to time with tightness of the more of the piriformis.  Discussed posture and ergonomics, discussed which activities to do which wants to avoid.  Patient is to increase activity as tolerated.  Follow-up again in 4 to 8 weeks

## 2019-03-02 NOTE — Assessment & Plan Note (Signed)
Decision today to treat with OMT was based on Physical Exam  After verbal consent patient was treated with HVLA, ME, FPR techniques in cervical, thoracic, rib lumbar and sacral areas  Patient tolerated the procedure well with improvement in symptoms  Patient given exercises, stretches and lifestyle modifications  See medications in patient instructions if given  Patient will follow up in 4-8 weeks 

## 2019-03-15 IMAGING — CT CT ANGIO CHEST
2 of 6 series · 19 of 36 positions shown · IV contrast (iopamidol)
Comparison: Chest x-ray earlier today

CLINICAL DATA: Elevated D-dimer

EXAM:
CT ANGIOGRAPHY CHEST WITH CONTRAST
TECHNIQUE: Multidetector CT imaging of the chest was performed using the
standard protocol during bolus administration of intravenous
contrast. Multiplanar CT image reconstructions and MIPs were
obtained to evaluate the vascular anatomy.
CONTRAST:  100mL SIRSZ8-1B5 IOPAMIDOL (SIRSZ8-1B5) INJECTION 76%

[Series 5: thins · axial · 0.59mm/px · z∈[-300,-0]mm · 18 of 334 slices shown]
[im 17/334  lung]
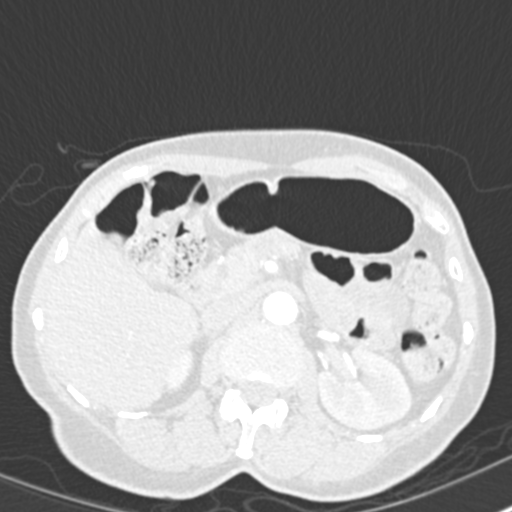
[im 34/334  mediastinal]
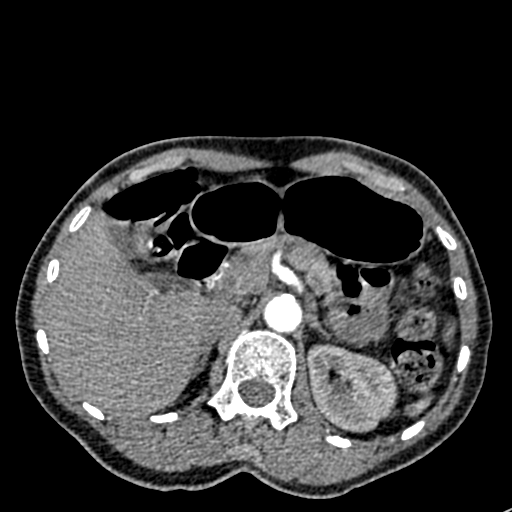
[im 50/334  lung]
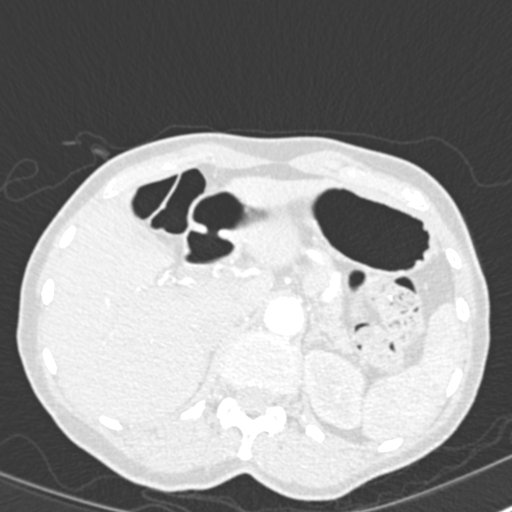
[im 67/334  mediastinal]
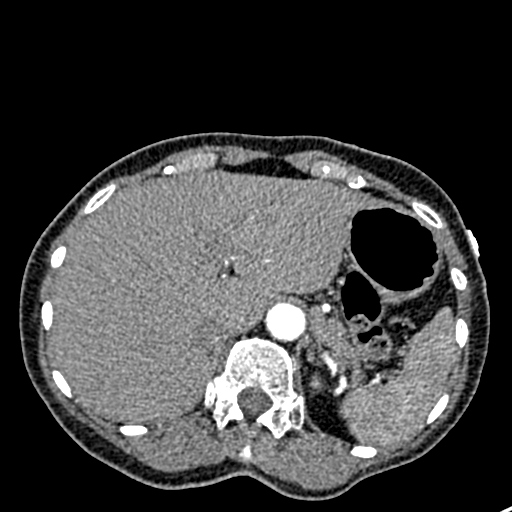
[im 84/334  lung]
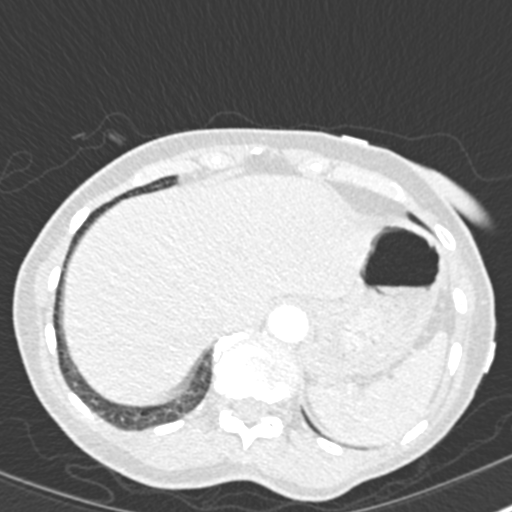
[im 100/334  mediastinal]
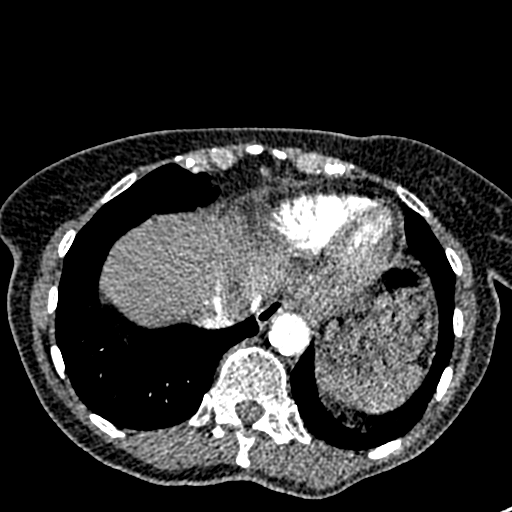
[im 117/334  lung]
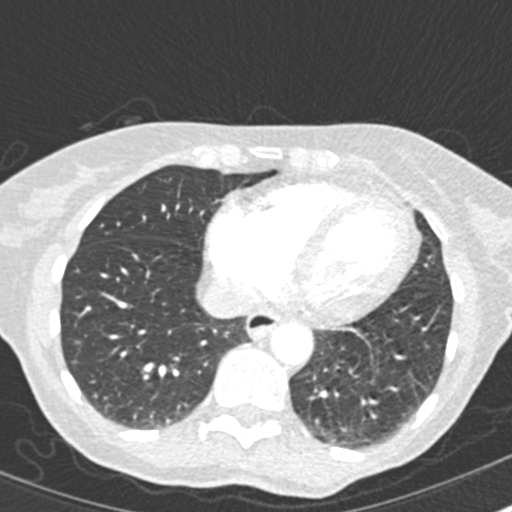
[im 134/334  mediastinal]
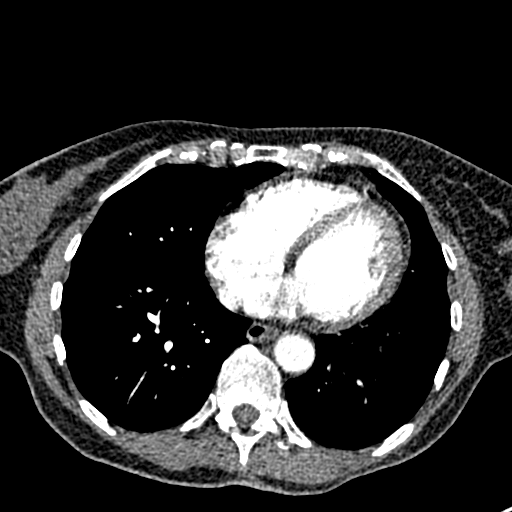
[im 150/334  lung]
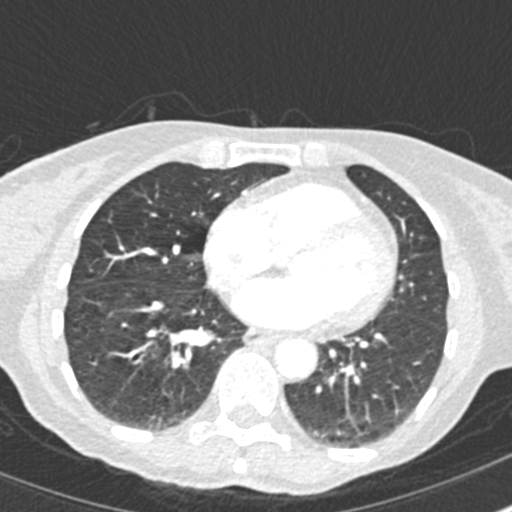
[im 184/334  mediastinal]
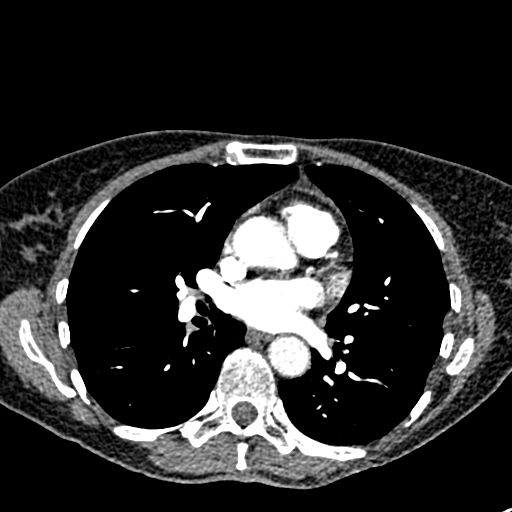
[im 200/334  lung]
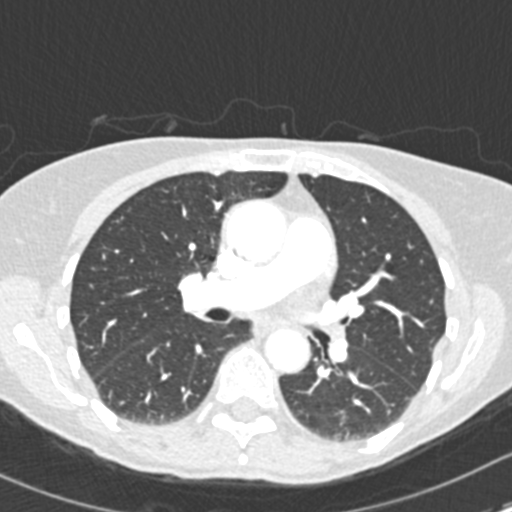
[im 217/334  mediastinal]
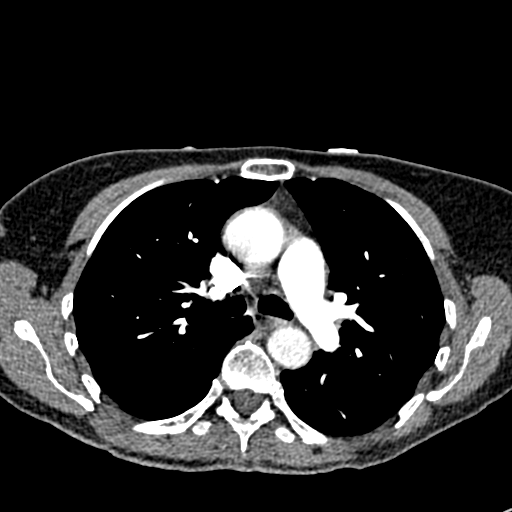
[im 234/334  lung]
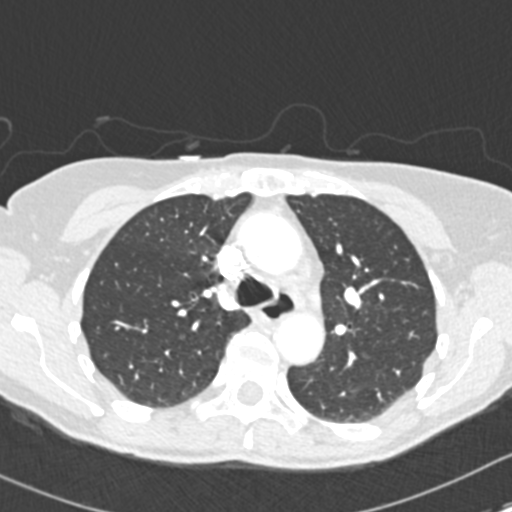
[im 250/334  mediastinal]
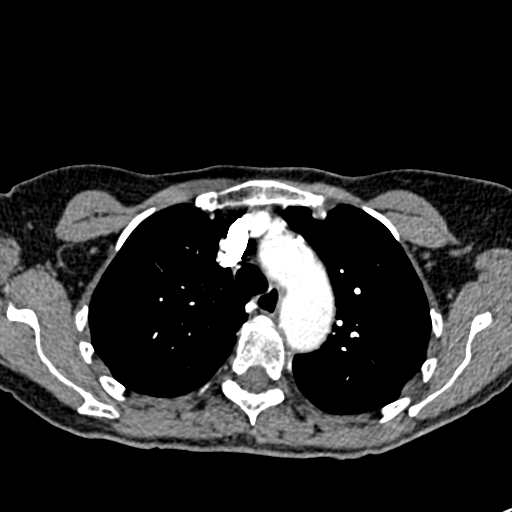
[im 267/334  lung]
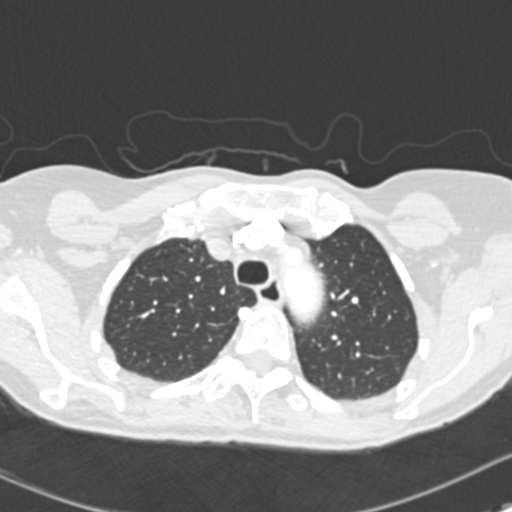
[im 284/334  mediastinal]
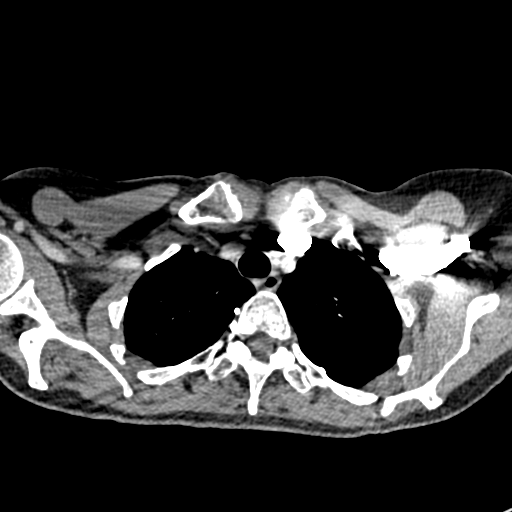
[im 300/334  lung]
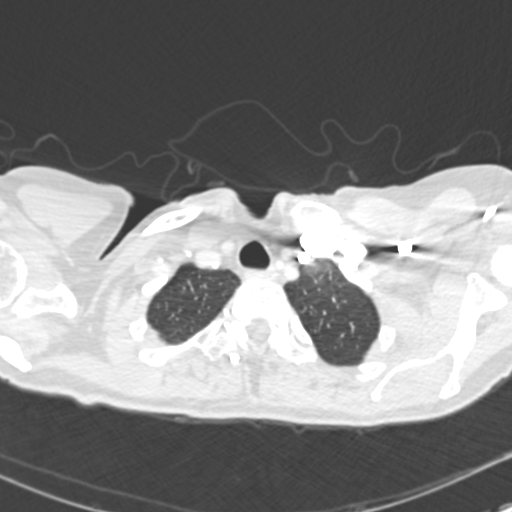
[im 317/334  mediastinal]
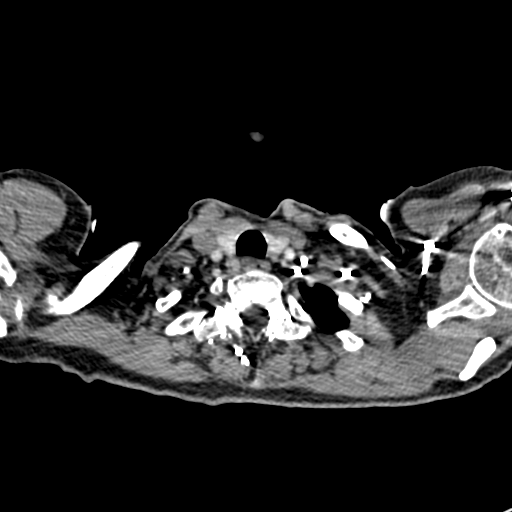

[Series 7: coronal mpr · coronal · 0.59mm/px · 1 of 113 slices shown]
[im 57/113  mediastinal]
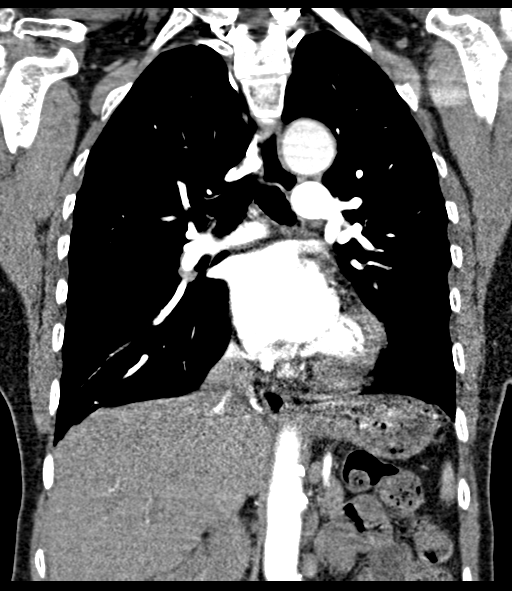

[19 of 36 positions shown; findings below may reference images not displayed]

FINDINGS: Cardiovascular: No filling defects in the pulmonary arteries to
suggest pulmonary emboli. Heart is normal size. Aorta is normal
caliber. Scattered coronary artery and aortic calcifications.

Mediastinum/Nodes: No mediastinal, hilar, or axillary adenopathy.

Lungs/Pleura: Lungs are clear. No focal airspace opacities or
suspicious nodules. No effusions.

Upper Abdomen: Imaging into the upper abdomen shows no acute
findings.

Musculoskeletal: Chest wall soft tissues are unremarkable. No acute
bony abnormality.

Review of the MIP images confirms the above findings.
IMPRESSION: No evidence of pulmonary embolus.

No acute cardiopulmonary disease.

Aortic Atherosclerosis ([id number].N).

## 2019-03-28 NOTE — Progress Notes (Signed)
[name] [place], [place] [address] Phone: [phone] Subjective:   [name], am serving as a scribe for Dr. Hulan Saas.   CC: Low back pain follow-up  [id number]  Gloria [name] is a 73 y.o. female coming in with complaint of back pain. Last seen on 03/02/2019 for OMT. Patient states that she has been having achy pain due to the weather. States that last night she slept on the left side but her right side from the hip to the foot are achy.       Past Medical History:  Diagnosis Date   Anxiety    Arthritis    CKD (chronic kidney disease), stage III    Complication of anesthesia 05/2014   had body shaking-observed over night-see anesthesia note-has had tremors post op in past   Depression    Hyperlipemia    Hypertension    Insomnia    Past Surgical History:  Procedure Laterality Date   ABDOMINAL HYSTERECTOMY     age 70   BACK SURGERY     CARPAL TUNNEL RELEASE  01/20/2012   Procedure: CARPAL TUNNEL RELEASE;  Surgeon: Wynonia Sours, MD;  Location: [facility];  Service: Orthopedics;  Laterality: Right;   CARPAL TUNNEL RELEASE  02/25/2012   Procedure: CARPAL TUNNEL RELEASE;  Surgeon: Wynonia Sours, MD;  Location: [facility];  Service: Orthopedics;  Laterality: Left;  RELEASE A-1 PULLEY LRF   COLONOSCOPY WITH PROPOFOL N/A 10/15/2016   Procedure: COLONOSCOPY WITH PROPOFOL;  Surgeon: Wilford Corner, MD;  Location: [facility];  Service: Endoscopy;  Laterality: N/A;   DISTAL BICEPS TENDON REPAIR Left 06/12/2015   Procedure: REPAIR LEFT BICEPS TENDON AND DECOMPRESSION OF MEDIAN NERVE;  Surgeon: Daryll Brod, MD;  Location: [facility];  Service: Orthopedics;  Laterality: Left;   SMALL INTESTINE SURGERY     SMALL INTESTINE SURGERY     has had 6 total bowel obstructions -mostlt adhesions-last 2009   TONSILLECTOMY     age 73   TRIGGER FINGER RELEASE  2/12   rt middle    TRIGGER FINGER RELEASE  2010   lt middle   ULNAR NERVE TRANSPOSITION Right 05/11/2014   Procedure: DECOMPRESSION ULNAR NERVE RIGHT ELBOW ;  Surgeon: Daryll Brod, MD;  Location: [facility];  Service: Orthopedics;  Laterality: Right;   ULNAR NERVE TRANSPOSITION Left 07/06/2014   Procedure: DECOMPRESSION  LEFT ULNAR NERVE;  Surgeon: Daryll Brod, MD;  Location: [facility];  Service: Orthopedics;  Laterality: Left;   Social History   Socioeconomic History   Marital status: Married    Spouse name: Not on file   Number of children: Not on file   Years of education: Not on file   Highest education level: Not on file  Occupational History   Not on file  Social Needs   Financial resource strain: Not on file   Food insecurity    Worry: Not on file    Inability: Not on file   Transportation needs    Medical: Not on file    Non-medical: Not on file  Tobacco Use   Smoking status: Never Smoker   Smokeless tobacco: Never Used  Substance and Sexual Activity   Alcohol use: No   Drug use: No   Sexual activity: Not on file  Lifestyle   Physical activity    Days per week: Not on file    Minutes per session:  Not on file   Stress: Not on file  Relationships   Social connections    Talks on phone: Not on file    Gets together: Not on file    Attends religious service: Not on file    Active member of club or organization: Not on file    Attends meetings of clubs or organizations: Not on file    Relationship status: Not on file  Other Topics Concern   Not on file  Social History Narrative   Not on file   Allergies  Allergen Reactions   Versed [Midazolam] Other (See Comments)   Diprivan [Propofol] Other (See Comments)    Uncontrolled shaking   Fentanyl    Sevoflurane    Zofran [Ondansetron Hcl]    Family History  Problem Relation Age of Onset   Heart failure Father     Current Outpatient Medications (Endocrine & Metabolic):      alendronate (FOSAMAX) 10 MG tablet, Take 10 mg by mouth daily before breakfast. Take with a full glass of water on an empty stomach.   predniSONE (DELTASONE) 50 MG tablet, Take one tablet daily for the next 5 days.  Current Outpatient Medications (Cardiovascular):    hydrochlorothiazide (MICROZIDE) 12.5 MG capsule, Take 12.5 mg by mouth daily.   Current Outpatient Medications (Analgesics):    aspirin 81 MG tablet, Take 81 mg by mouth daily.   Current Outpatient Medications (Other):    Carboxymethylcellulose Sodium (EYE DROPS OP), Place 1 drop into both eyes daily as needed (dry eyes).   diclofenac sodium (VOLTAREN) 1 % GEL, Apply 2 g topically 4 (four) times daily.   ESTRACE VAGINAL 0.1 MG/GM vaginal cream,    gabapentin (NEURONTIN) 100 MG capsule, TAKE 2 CAPSULES BY MOUTH AT BEDTIME   hydroxychloroquine (PLAQUENIL) 200 MG tablet, Take 400 mg by mouth daily.    Misc Natural Products (TART CHERRY ADVANCED PO), Take 1 tablet by mouth daily.   Multiple Vitamins-Calcium (ONE-A-DAY WOMENS PO), Take 1 tablet by mouth daily.   potassium chloride (K-DUR,KLOR-CON) 10 MEQ tablet, Take 10 mEq by mouth daily.   traZODone (DESYREL) 50 MG tablet, Take 50-150 mg by mouth at bedtime.    TURMERIC PO, Take 1 tablet by mouth daily.   Vitamin D, Ergocalciferol, (DRISDOL) 1.25 MG (50000 UT) CAPS capsule, TAKE 1 CAPSULE BY MOUTH ONE TIME PER WEEK    Past medical history, social, surgical and family history all reviewed in electronic medical record.  No pertanent information unless stated regarding to the chief complaint.   Review of Systems:  No headache, visual changes, nausea, vomiting, diarrhea, constipation, dizziness, abdominal pain, skin rash, fevers, chills, night sweats, weight loss, swollen lymph nodes, body aches, joint swelling,  chest pain, shortness of breath, mood changes.  Positive muscle aches  Objective  Blood pressure 132/82, pulse 78, height 5\' 6"  (1.676 m), weight 136  lb (61.7 kg), SpO2 98 %.    General: No apparent distress alert and oriented x3 mood and affect normal, dressed appropriately.  HEENT: Pupils equal, extraocular movements intact  Respiratory: Patient's speak in full sentences and does not appear short of breath  Cardiovascular: No lower extremity edema, non tender, no erythema  Skin: Warm dry intact with no signs of infection or rash on extremities or on axial skeleton.  Abdomen: Soft nontender  Neuro: Cranial nerves II through XII are intact, neurovascularly intact in all extremities with 2+ DTRs and 2+ pulses.  Lymph: No lymphadenopathy of posterior or anterior cervical chain or axillae  bilaterally.  Gait normal with good balance and coordination.  MSK:  tender with limited range of motion and good stability and symmetric strength and tone of shoulders, elbows, wrist, hip, knee and ankles bilaterally.  Arthritic changes of multiple joints Low back exam does have significant degenerative scoliosis noted.  And significant tightness of the paraspinal musculature of the lumbar spine.  Patient has pain also in the thoracolumbar junction.  Negative straight leg test but some mild increase in tightness with right-sided FABER test.  Osteopathic findings  C2 flexed rotated and side bent right C4 flexed rotated and side bent left C6 flexed rotated and side bent left T3 extended rotated and side bent right inhaled third rib T5-9 neutral rotated right side bent left L2 flexed rotated and side bent right Sacrum right on right    Impression and Recommendations:     This case required medical decision making of moderate complexity. The above documentation has been reviewed and is accurate and complete Lyndal Pulley, DO       Note: This dictation was prepared with Dragon dictation along with smaller phrase technology. Any transcriptional errors that result from this process are unintentional.

## 2019-03-29 ENCOUNTER — Ambulatory Visit: Payer: PPO | Admitting: Family Medicine

## 2019-03-29 ENCOUNTER — Other Ambulatory Visit: Payer: Self-pay

## 2019-03-29 ENCOUNTER — Encounter: Payer: Self-pay | Admitting: Family Medicine

## 2019-03-29 VITALS — BP 132/82 | HR 78 | Ht 66.0 in | Wt 136.0 lb

## 2019-03-29 DIAGNOSIS — M999 Biomechanical lesion, unspecified: Secondary | ICD-10-CM

## 2019-03-29 DIAGNOSIS — M48061 Spinal stenosis, lumbar region without neurogenic claudication: Secondary | ICD-10-CM | POA: Diagnosis not present

## 2019-03-29 MED ORDER — PREDNISONE 50 MG PO TABS: ORAL_TABLET | ORAL | 0 refills | Status: DC

## 2019-03-29 NOTE — Assessment & Plan Note (Signed)
Lumbar spinal stenosis.  Discussed which activities of daily which wants to avoid.  Discussed icing regimen exercises, which activities to do which was to avoid.  Icing regimen.  Patient is going to follow-up with me again in 4 to 8 weeks for further evaluation and treatment.

## 2019-03-29 NOTE — Assessment & Plan Note (Signed)
Decision today to treat with OMT was based on Physical Exam  After verbal consent patient was treated with HVLA, ME, FPR techniques in cervical, thoracic, rib lumbar and sacral areas  Patient tolerated the procedure well with improvement in symptoms  Patient given exercises, stretches and lifestyle modifications  See medications in patient instructions if given  Patient will follow up in 4-8 weeks 

## 2019-04-27 ENCOUNTER — Encounter: Payer: Self-pay | Admitting: Family Medicine

## 2019-04-27 ENCOUNTER — Other Ambulatory Visit: Payer: Self-pay

## 2019-04-27 ENCOUNTER — Ambulatory Visit (INDEPENDENT_AMBULATORY_CARE_PROVIDER_SITE_OTHER): Payer: PPO | Admitting: Family Medicine

## 2019-04-27 VITALS — BP 180/84 | HR 87 | Ht 66.0 in | Wt 134.0 lb

## 2019-04-27 DIAGNOSIS — M999 Biomechanical lesion, unspecified: Secondary | ICD-10-CM | POA: Diagnosis not present

## 2019-04-27 DIAGNOSIS — M48061 Spinal stenosis, lumbar region without neurogenic claudication: Secondary | ICD-10-CM | POA: Diagnosis not present

## 2019-04-27 NOTE — Assessment & Plan Note (Signed)
Continues to have degenerative changes.  Discussed with patient about icing regimen and home exercise, which activities to do which wants to avoid.  Patient is to increase activity slowly.  Feel that she continues to respond fairly well.  Follow-up again in 4 to 8 weeks.

## 2019-04-27 NOTE — Assessment & Plan Note (Signed)
Decision today to treat with OMT was based on Physical Exam  After verbal consent patient was treated with HVLA, ME, FPR techniques in cervical, thoracic, rib, lumbar and sacral areas  Patient tolerated the procedure well with improvement in symptoms  Patient given exercises, stretches and lifestyle modifications  See medications in patient instructions if given  Patient will follow up in 4-6 weeks 

## 2019-04-27 NOTE — Patient Instructions (Signed)
Happy Holidays  Eat with in 30 minutes of working out Add 1/4 cup Gatorade to water Vitamin D sent  Come back to see me in 4-6 weeks

## 2019-04-27 NOTE — Progress Notes (Signed)
[name] [address] [place], [place] [address] Phone: [phone] Subjective:   Rito Ehrlich, am serving as a scribe for Dr. Hulan Saas.  This visit occurred during the SARS-CoV-2 public health emergency.  Safety protocols were in place, including screening questions prior to the visit, additional usage of staff PPE, and extensive cleaning of exam room while observing appropriate contact time as indicated for disinfecting solutions.    CC: Low back pain  04/27/2019 OMT  [id number]  Gloria [name] is a 73 y.o. female coming in with complaint of back pain.  Patient has no known neurogenic claudication with degenerative scoliosis of lumbar spine.  Has responded fairly well to conservative therapy including home exercises, as well as osteopathic manipulation.  States recently has started playing pickle ball.  Feels like this is somewhat helpful but also can have unfortunately cramping after playing.     Past Medical History:  Diagnosis Date  . Anxiety   . Arthritis   . CKD (chronic kidney disease), stage III   . Complication of anesthesia 05/2014   had body shaking-observed over night-see anesthesia note-has had tremors post op in past  . Depression   . Hyperlipemia   . Hypertension   . Insomnia    Past Surgical History:  Procedure Laterality Date  . ABDOMINAL HYSTERECTOMY     age 4  . BACK SURGERY    . CARPAL TUNNEL RELEASE  01/20/2012   Procedure: CARPAL TUNNEL RELEASE;  Surgeon: Wynonia Sours, MD;  Location: [facility];  Service: Orthopedics;  Laterality: Right;  . CARPAL TUNNEL RELEASE  02/25/2012   Procedure: CARPAL TUNNEL RELEASE;  Surgeon: Wynonia Sours, MD;  Location: [facility];  Service: Orthopedics;  Laterality: Left;  RELEASE A-1 PULLEY LRF  . COLONOSCOPY WITH PROPOFOL N/A 10/15/2016   Procedure: COLONOSCOPY WITH PROPOFOL;  Surgeon: Wilford Corner, MD;  Location: [facility];  Service:  Endoscopy;  Laterality: N/A;  . DISTAL BICEPS TENDON REPAIR Left 06/12/2015   Procedure: REPAIR LEFT BICEPS TENDON AND DECOMPRESSION OF MEDIAN NERVE;  Surgeon: Daryll Brod, MD;  Location: [facility];  Service: Orthopedics;  Laterality: Left;  . SMALL INTESTINE SURGERY    . SMALL INTESTINE SURGERY     has had 6 total bowel obstructions -mostlt adhesions-last 2009  . TONSILLECTOMY     age 27  . TRIGGER FINGER RELEASE  2/12   rt middle  . TRIGGER FINGER RELEASE  2010   lt middle  . ULNAR NERVE TRANSPOSITION Right 05/11/2014   Procedure: DECOMPRESSION ULNAR NERVE RIGHT ELBOW ;  Surgeon: Daryll Brod, MD;  Location: [facility];  Service: Orthopedics;  Laterality: Right;  . ULNAR NERVE TRANSPOSITION Left 07/06/2014   Procedure: DECOMPRESSION  LEFT ULNAR NERVE;  Surgeon: Daryll Brod, MD;  Location: [facility];  Service: Orthopedics;  Laterality: Left;   Social History   Socioeconomic History  . Marital status: Married    Spouse name: Not on file  . Number of children: Not on file  . Years of education: Not on file  . Highest education level: Not on file  Occupational History  . Not on file  Tobacco Use  . Smoking status: Never Smoker  . Smokeless tobacco: Never Used  Substance and Sexual Activity  . Alcohol use: No  . Drug use: No  . Sexual activity: Not on file  Other Topics Concern  . Not on file  Social History  Narrative  . Not on file   Social Determinants of Health   Financial Resource Strain:   . Difficulty of Paying Living Expenses: Not on file  Food Insecurity:   . Worried About Charity fundraiser in the Last Year: Not on file  . Ran Out of Food in the Last Year: Not on file  Transportation Needs:   . Lack of Transportation (Medical): Not on file  . Lack of Transportation (Non-Medical): Not on file  Physical Activity:   . Days of Exercise per Week: Not on file  . Minutes of Exercise per Session: Not on file  Stress:   . Feeling  of Stress : Not on file  Social Connections:   . Frequency of Communication with Friends and Family: Not on file  . Frequency of Social Gatherings with Friends and Family: Not on file  . Attends Religious Services: Not on file  . Active Member of Clubs or Organizations: Not on file  . Attends Archivist Meetings: Not on file  . Marital Status: Not on file   Allergies  Allergen Reactions  . Versed [Midazolam] Other (See Comments)  . Diprivan [Propofol] Other (See Comments)    Uncontrolled shaking  . Fentanyl   . Sevoflurane   . Zofran [Ondansetron Hcl]    Family History  Problem Relation Age of Onset  . Heart failure Father     Current Outpatient Medications (Endocrine & Metabolic):  .  alendronate (FOSAMAX) 10 MG tablet, Take 10 mg by mouth daily before breakfast. Take with a full glass of water on an empty stomach. .  predniSONE (DELTASONE) 50 MG tablet, Take one tablet daily for the next 5 days.  Current Outpatient Medications (Cardiovascular):  .  hydrochlorothiazide (MICROZIDE) 12.5 MG capsule, Take 12.5 mg by mouth daily.   Current Outpatient Medications (Analgesics):  .  aspirin 81 MG tablet, Take 81 mg by mouth daily.   Current Outpatient Medications (Other):  Marland Kitchen  Carboxymethylcellulose Sodium (EYE DROPS OP), Place 1 drop into both eyes daily as needed (dry eyes). Marland Kitchen  diclofenac sodium (VOLTAREN) 1 % GEL, Apply 2 g topically 4 (four) times daily. Marland Kitchen  ESTRACE VAGINAL 0.1 MG/GM vaginal cream,  .  gabapentin (NEURONTIN) 100 MG capsule, TAKE 2 CAPSULES BY MOUTH AT BEDTIME .  hydroxychloroquine (PLAQUENIL) 200 MG tablet, Take 400 mg by mouth daily.  .  Misc Natural Products (TART CHERRY ADVANCED PO), Take 1 tablet by mouth daily. .  Multiple Vitamins-Calcium (ONE-A-DAY WOMENS PO), Take 1 tablet by mouth daily. .  potassium chloride (K-DUR,KLOR-CON) 10 MEQ tablet, Take 10 mEq by mouth daily. .  traZODone (DESYREL) 50 MG tablet, Take 50-150 mg by mouth at bedtime.    .  TURMERIC PO, Take 1 tablet by mouth daily. .  Vitamin D, Ergocalciferol, (DRISDOL) 1.25 MG (50000 UT) CAPS capsule, TAKE 1 CAPSULE BY MOUTH ONE TIME PER WEEK    Past medical history, social, surgical and family history all reviewed in electronic medical record.  No pertanent information unless stated regarding to the chief complaint.   Review of Systems:  No headache, visual changes, nausea, vomiting, diarrhea, constipation, dizziness, abdominal pain, skin rash, fevers, chills, night sweats, weight loss, swollen lymph nodes, body aches, joint swelling, muscle aches, chest pain, shortness of breath, mood changes.   Objective  Blood pressure (!) 180/84, pulse 87, height 5\' 6"  (1.676 m), weight 134 lb (60.8 kg), SpO2 97 %.    General: No apparent distress alert and oriented x3  mood and affect normal, dressed appropriately.  HEENT: Pupils equal, extraocular movements intact  Respiratory: Patient's speak in full sentences and does not appear short of breath  Cardiovascular: No lower extremity edema, non tender, no erythema  Skin: Warm dry intact with no signs of infection or rash on extremities or on axial skeleton.  Abdomen: Soft nontender  Neuro: Cranial nerves II through XII are intact, neurovascularly intact in all extremities with 2+ DTRs and 2+ pulses.  Lymph: No lymphadenopathy of posterior or anterior cervical chain or axillae bilaterally.  Gait normal with good balance and coordination.  MSK:  Non tender with full range of motion and good stability and symmetric strength and tone of shoulders, elbows, wrist, hip, knee and ankles bilaterally.  Arthritic changes of multiple joints.  Mild synovitis noted of the hands mostly of the MCP joints.  Back exam still shows the significant scoliosis.  Patient does have some tightness noted in the parascapular region.  Mild tightness with Corky Sox test.  Negative straight leg test.   Osteopathic findings  C2 flexed rotated and side bent  right C7 flexed rotated and side bent left T3 extended rotated and side bent right inhaled third rib T5-T9 neutral rotated right side bent left L4 flexed rotated and side bent right Sacrum right on right     Impression and Recommendations:     This case required medical decision making of moderate complexity. The above documentation has been reviewed and is accurate and complete Lyndal Pulley, DO       Note: This dictation was prepared with Dragon dictation along with smaller phrase technology. Any transcriptional errors that result from this process are unintentional.

## 2019-05-24 ENCOUNTER — Ambulatory Visit: Payer: PPO | Admitting: Family Medicine

## 2019-05-24 DIAGNOSIS — M0609 Rheumatoid arthritis without rheumatoid factor, multiple sites: Secondary | ICD-10-CM | POA: Diagnosis not present

## 2019-05-24 DIAGNOSIS — Z79899 Other long term (current) drug therapy: Secondary | ICD-10-CM | POA: Diagnosis not present

## 2019-05-25 ENCOUNTER — Ambulatory Visit: Payer: PPO | Admitting: Family Medicine

## 2019-05-25 ENCOUNTER — Other Ambulatory Visit: Payer: Self-pay

## 2019-05-25 ENCOUNTER — Encounter: Payer: Self-pay | Admitting: Family Medicine

## 2019-05-25 VITALS — BP 134/90 | HR 81 | Ht 66.0 in | Wt 134.0 lb

## 2019-05-25 DIAGNOSIS — M999 Biomechanical lesion, unspecified: Secondary | ICD-10-CM

## 2019-05-25 DIAGNOSIS — M48061 Spinal stenosis, lumbar region without neurogenic claudication: Secondary | ICD-10-CM | POA: Diagnosis not present

## 2019-05-25 NOTE — Assessment & Plan Note (Signed)
Patient doing relatively well with conservative therapy at this time.  Discussed which activities to do which wants to avoid.  Patient is to increase activity slowly over the course the next several weeks.  Patient seems to do relatively well with conservative therapy including osteopathic manipulation.  Follow-up again in 4 to 8 weeks

## 2019-05-25 NOTE — Progress Notes (Signed)
[place] Phone: [phone] Subjective:   [name], am serving as a scribe for Dr. Hulan Saas. This visit occurred during the SARS-CoV-2 public health emergency.  Safety protocols were in place, including screening questions prior to the visit, additional usage of staff PPE, and extensive cleaning of exam room while observing appropriate contact time as indicated for disinfecting solutions.   I'm seeing this patient by the request  of:  [name], MD (Inactive)  CC: Low back pain follow-up  [id number]   Carola Lacroix Schoenecker is a 74 y.o. female coming in with complaint of back pain. Last seen on 04/27/2019 for OMT. Patient states that her back has been doing well. Does still get headaches.  Patient still has some tightness of the lower back but nothing severe at the moment.  No radicular symptoms.        Past Medical History:  Diagnosis Date  . Anxiety   . Arthritis   . CKD (chronic kidney disease), stage III   . Complication of anesthesia 05/2014   had body shaking-observed over night-see anesthesia note-has had tremors post op in past  . Depression   . Hyperlipemia   . Hypertension   . Insomnia    Past Surgical History:  Procedure Laterality Date  . ABDOMINAL HYSTERECTOMY     age 89  . BACK SURGERY    . CARPAL TUNNEL RELEASE  01/20/2012   Procedure: CARPAL TUNNEL RELEASE;  Surgeon: Wynonia Sours, MD;  Location: [facility];  Service: Orthopedics;  Laterality: Right;  . CARPAL TUNNEL RELEASE  02/25/2012   Procedure: CARPAL TUNNEL RELEASE;  Surgeon: Wynonia Sours, MD;  Location: [facility];  Service: Orthopedics;  Laterality: Left;  RELEASE A-1 PULLEY LRF  . COLONOSCOPY WITH PROPOFOL N/A 10/15/2016   Procedure: COLONOSCOPY WITH PROPOFOL;  Surgeon: Wilford Corner, MD;  Location: [place];  Service: Endoscopy;  Laterality: N/A;  . DISTAL BICEPS TENDON REPAIR Left  06/12/2015   Procedure: REPAIR LEFT BICEPS TENDON AND DECOMPRESSION OF MEDIAN NERVE;  Surgeon: Daryll Brod, MD;  Location: [facility];  Service: Orthopedics;  Laterality: Left;  . SMALL INTESTINE SURGERY    . SMALL INTESTINE SURGERY     has had 6 total bowel obstructions -mostlt adhesions-last 2009  . TONSILLECTOMY     age 51  . TRIGGER FINGER RELEASE  2/12   rt middle  . TRIGGER FINGER RELEASE  2010   lt middle  . ULNAR NERVE TRANSPOSITION Right 05/11/2014   Procedure: DECOMPRESSION ULNAR NERVE RIGHT ELBOW ;  Surgeon: Daryll Brod, MD;  Location: [facility];  Service: Orthopedics;  Laterality: Right;  . ULNAR NERVE TRANSPOSITION Left 07/06/2014   Procedure: DECOMPRESSION  LEFT ULNAR NERVE;  Surgeon: Daryll Brod, MD;  Location: [place] [facility];  Service: Orthopedics;  Laterality: Left;   Social History   Socioeconomic History  . Marital status: Married    Spouse name: Not on file  . Number of children: Not on file  . Years of education: Not on file  . Highest education level: Not on file  Occupational History  . Not on file  Tobacco Use  . Smoking status: Never Smoker  . Smokeless tobacco: Never Used  Substance and Sexual Activity  . Alcohol use: No  . Drug use: No  . Sexual activity: Not on file  Other Topics Concern  . Not on file  Social  History Narrative  . Not on file   Social Determinants of Health   Financial Resource Strain:   . Difficulty of Paying Living Expenses: Not on file  Food Insecurity:   . Worried About Charity fundraiser in the Last Year: Not on file  . Ran Out of Food in the Last Year: Not on file  Transportation Needs:   . Lack of Transportation (Medical): Not on file  . Lack of Transportation (Non-Medical): Not on file  Physical Activity:   . Days of Exercise per Week: Not on file  . Minutes of Exercise per Session: Not on file  Stress:   . Feeling of Stress : Not on file  Social Connections:   . Frequency of  Communication with Friends and Family: Not on file  . Frequency of Social Gatherings with Friends and Family: Not on file  . Attends Religious Services: Not on file  . Active Member of Clubs or Organizations: Not on file  . Attends Archivist Meetings: Not on file  . Marital Status: Not on file   Allergies  Allergen Reactions  . Versed [Midazolam] Other (See Comments)  . Diprivan [Propofol] Other (See Comments)    Uncontrolled shaking  . Fentanyl   . Sevoflurane   . Zofran [Ondansetron Hcl]    Family History  Problem Relation Age of Onset  . Heart failure Father     Current Outpatient Medications (Endocrine & Metabolic):  .  alendronate (FOSAMAX) 10 MG tablet, Take 10 mg by mouth daily before breakfast. Take with a full glass of water on an empty stomach. .  predniSONE (DELTASONE) 50 MG tablet, Take one tablet daily for the next 5 days.  Current Outpatient Medications (Cardiovascular):  .  hydrochlorothiazide (MICROZIDE) 12.5 MG capsule, Take 12.5 mg by mouth daily.   Current Outpatient Medications (Analgesics):  .  aspirin 81 MG tablet, Take 81 mg by mouth daily.   Current Outpatient Medications (Other):  Marland Kitchen  Carboxymethylcellulose Sodium (EYE DROPS OP), Place 1 drop into both eyes daily as needed (dry eyes). Marland Kitchen  diclofenac sodium (VOLTAREN) 1 % GEL, Apply 2 g topically 4 (four) times daily. Marland Kitchen  ESTRACE VAGINAL 0.1 MG/GM vaginal cream,  .  gabapentin (NEURONTIN) 100 MG capsule, TAKE 2 CAPSULES BY MOUTH AT BEDTIME .  hydroxychloroquine (PLAQUENIL) 200 MG tablet, Take 400 mg by mouth daily.  .  Misc Natural Products (TART CHERRY ADVANCED PO), Take 1 tablet by mouth daily. .  Multiple Vitamins-Calcium (ONE-A-DAY WOMENS PO), Take 1 tablet by mouth daily. .  potassium chloride (K-DUR,KLOR-CON) 10 MEQ tablet, Take 10 mEq by mouth daily. .  traZODone (DESYREL) 50 MG tablet, Take 50-150 mg by mouth at bedtime.  .  TURMERIC PO, Take 1 tablet by mouth daily. .  Vitamin D,  Ergocalciferol, (DRISDOL) 1.25 MG (50000 UT) CAPS capsule, TAKE 1 CAPSULE BY MOUTH ONE TIME PER WEEK    Past medical history, social, surgical and family history all reviewed in electronic medical record.  No pertanent information unless stated regarding to the chief complaint.   Review of Systems:  No headache, visual changes, nausea, vomiting, diarrhea, constipation, dizziness, abdominal pain, skin rash, fevers, chills, night sweats, weight loss, swollen lymph nodes, body aches, joint swelling, chest pain, shortness of breath, mood changes. POSITIVE muscle aches  Objective  Blood pressure 134/90, pulse 81, height 5\' 6"  (1.676 m), weight 134 lb (60.8 kg), SpO2 99 %.   General: No apparent distress alert and oriented x3 mood and  affect normal, dressed appropriately.  HEENT: Pupils equal, extraocular movements intact  Respiratory: Patient's speak in full sentences and does not appear short of breath  Cardiovascular: No lower extremity edema, non tender, no erythema  Skin: Warm dry intact with no signs of infection or rash on extremities or on axial skeleton.  Abdomen: Soft nontender  Neuro: Cranial nerves II through XII are intact, neurovascularly intact in all extremities with 2+ DTRs and 2+ pulses.  Lymph: No lymphadenopathy of posterior or anterior cervical chain or axillae bilaterally.  Gait normal with good balance and coordination.  MSK:  Non tender with full range of motion and good stability and symmetric strength and tone of shoulders, elbows, wrist, hip, knee and ankles bilaterally.  Neck: Inspection mild loss of lordosis. No palpable stepoffs. Mild limited range of motion in sidebending bilaterally Grip strength and sensation normal in bilateral hands Strength good C4 to T1 distribution No sensory change to C4 to T1 Negative Hoffman sign bilaterally Reflexes normal Tightness in the trapezius bilaterally   Back Exam:  Inspection: Loss of lordosis Motion: Flexion 45 deg,  Extension 25 deg, Side Bending to 35 deg bilaterally,  Rotation to 35 deg bilaterally  SLR laying: Negative  XSLR laying: Negative  Palpable tenderness: Tender to palpation in the paraspinal musculature of the lumbar spine right greater than left. FABER: Tightness bilaterally. Sensory change: Gross sensation intact to all lumbar and sacral dermatomes.  Reflexes: 2+ at both patellar tendons, 2+ at achilles tendons, Babinski's downgoing.  Strength at foot  Plantar-flexion: 5/5 Dorsi-flexion: 5/5 Eversion: 5/5 Inversion: 5/5  Leg strength  Quad: 5/5 Hamstring: 5/5 Hip flexor: 5/5 Hip abductors: 4/5    Osteopathic findings  C2 flexed rotated and side bent right C4 flexed rotated and side bent left C7 flexed rotated and side bent left T5 extended rotated and side bent right inhaled rib T8 extended rotated and side bent left L1-L4 neutral rotated right side bent left Sacrum right on right    Impression and Recommendations:     This case required medical decision making of moderate complexity. The above documentation has been reviewed and is accurate and complete Lyndal Pulley, DO       Note: This dictation was prepared with Dragon dictation along with smaller phrase technology. Any transcriptional errors that result from this process are unintentional.

## 2019-05-25 NOTE — Patient Instructions (Signed)
Flonase daily for 2 weeks See me in 4 weeks

## 2019-05-25 NOTE — Assessment & Plan Note (Signed)
Decision today to treat with OMT was based on Physical Exam  After verbal consent patient was treated with HVLA, ME, FPR techniques in cervical, thoracic, rib,  lumbar and sacral areas  Patient tolerated the procedure well with improvement in symptoms  Patient given exercises, stretches and lifestyle modifications  See medications in patient instructions if given  Patient will follow up in 4-8 weeks 

## 2019-06-19 ENCOUNTER — Other Ambulatory Visit: Payer: Self-pay | Admitting: Family Medicine

## 2019-06-21 ENCOUNTER — Encounter: Payer: Self-pay | Admitting: Family Medicine

## 2019-06-21 ENCOUNTER — Other Ambulatory Visit: Payer: Self-pay

## 2019-06-21 ENCOUNTER — Ambulatory Visit: Payer: PPO | Admitting: Family Medicine

## 2019-06-21 VITALS — BP 120/78 | HR 85 | Ht 66.0 in | Wt 132.0 lb

## 2019-06-21 DIAGNOSIS — M069 Rheumatoid arthritis, unspecified: Secondary | ICD-10-CM

## 2019-06-21 DIAGNOSIS — M48061 Spinal stenosis, lumbar region without neurogenic claudication: Secondary | ICD-10-CM | POA: Diagnosis not present

## 2019-06-21 DIAGNOSIS — M999 Biomechanical lesion, unspecified: Secondary | ICD-10-CM

## 2019-06-21 NOTE — Patient Instructions (Signed)
Good luck with the trees  See me in 4 weeks

## 2019-06-21 NOTE — Progress Notes (Signed)
[place] Phone: [phone] Subjective:   [name], am serving as a scribe for Dr. Hulan Saas..This visit occurred during the SARS-CoV-2 public health emergency.  Safety protocols were in place, including screening questions prior to the visit, additional usage of staff PPE, and extensive cleaning of exam room while observing appropriate contact time as indicated for disinfecting solutions.  I'm seeing this patient by the request  of:  [name], MD (Inactive)  CC: Low back exam follow-up  [id number]  Gloria [name] is a 74 y.o. female coming in with complaint of back pain. Last seen on 05/25/2019 for OMT. Patient states that she is having tightness in the right shoulder. Otherwise is doing fine since last visit and is here for OMT to manage her back pain.      Past Medical History:  Diagnosis Date  . Anxiety   . Arthritis   . CKD (chronic kidney disease), stage III   . Complication of anesthesia 05/2014   had body shaking-observed over night-see anesthesia note-has had tremors post op in past  . Depression   . Hyperlipemia   . Hypertension   . Insomnia    Past Surgical History:  Procedure Laterality Date  . ABDOMINAL HYSTERECTOMY     age 25  . BACK SURGERY    . CARPAL TUNNEL RELEASE  01/20/2012   Procedure: CARPAL TUNNEL RELEASE;  Surgeon: Wynonia Sours, MD;  Location: [facility];  Service: Orthopedics;  Laterality: Right;  . CARPAL TUNNEL RELEASE  02/25/2012   Procedure: CARPAL TUNNEL RELEASE;  Surgeon: Wynonia Sours, MD;  Location: [facility];  Service: Orthopedics;  Laterality: Left;  RELEASE A-1 PULLEY LRF  . COLONOSCOPY WITH PROPOFOL N/A 10/15/2016   Procedure: COLONOSCOPY WITH PROPOFOL;  Surgeon: Wilford Corner, MD;  Location: [facility];  Service: Endoscopy;  Laterality: N/A;  . DISTAL BICEPS TENDON REPAIR Left 06/12/2015   Procedure: REPAIR LEFT BICEPS  TENDON AND DECOMPRESSION OF MEDIAN NERVE;  Surgeon: Daryll Brod, MD;  Location: [facility];  Service: Orthopedics;  Laterality: Left;  . SMALL INTESTINE SURGERY    . SMALL INTESTINE SURGERY     has had 6 total bowel obstructions -mostlt adhesions-last 2009  . TONSILLECTOMY     age 70  . TRIGGER FINGER RELEASE  2/12   rt middle  . TRIGGER FINGER RELEASE  2010   lt middle  . ULNAR NERVE TRANSPOSITION Right 05/11/2014   Procedure: DECOMPRESSION ULNAR NERVE RIGHT ELBOW ;  Surgeon: Daryll Brod, MD;  Location: [facility];  Service: Orthopedics;  Laterality: Right;  . ULNAR NERVE TRANSPOSITION Left 07/06/2014   Procedure: DECOMPRESSION  LEFT ULNAR NERVE;  Surgeon: Daryll Brod, MD;  Location: [place];  Service: Orthopedics;  Laterality: Left;   Social History   Socioeconomic History  . Marital status: Married    Spouse name: Not on file  . Number of children: Not on file  . Years of education: Not on file  . Highest education level: Not on file  Occupational History  . Not on file  Tobacco Use  . Smoking status: Never Smoker  . Smokeless tobacco: Never Used  Substance and Sexual Activity  . Alcohol use: No  . Drug use: No  . Sexual activity: Not on file  Other Topics Concern  . Not on file  Social History Narrative  . Not on file   Social  Determinants of Health   Financial Resource Strain:   . Difficulty of Paying Living Expenses: Not on file  Food Insecurity:   . Worried About Charity fundraiser in the Last Year: Not on file  . Ran Out of Food in the Last Year: Not on file  Transportation Needs:   . Lack of Transportation (Medical): Not on file  . Lack of Transportation (Non-Medical): Not on file  Physical Activity:   . Days of Exercise per Week: Not on file  . Minutes of Exercise per Session: Not on file  Stress:   . Feeling of Stress : Not on file  Social Connections:   . Frequency of Communication with Friends and Family: Not  on file  . Frequency of Social Gatherings with Friends and Family: Not on file  . Attends Religious Services: Not on file  . Active Member of Clubs or Organizations: Not on file  . Attends Archivist Meetings: Not on file  . Marital Status: Not on file   Allergies  Allergen Reactions  . Versed [Midazolam] Other (See Comments)  . Diprivan [Propofol] Other (See Comments)    Uncontrolled shaking  . Fentanyl   . Sevoflurane   . Zofran [Ondansetron Hcl]    Family History  Problem Relation Age of Onset  . Heart failure Father     Current Outpatient Medications (Endocrine & Metabolic):  .  alendronate (FOSAMAX) 10 MG tablet, Take 10 mg by mouth daily before breakfast. Take with a full glass of water on an empty stomach. .  predniSONE (DELTASONE) 50 MG tablet, Take one tablet daily for the next 5 days.  Current Outpatient Medications (Cardiovascular):  .  hydrochlorothiazide (MICROZIDE) 12.5 MG capsule, Take 12.5 mg by mouth daily.   Current Outpatient Medications (Analgesics):  .  aspirin 81 MG tablet, Take 81 mg by mouth daily.   Current Outpatient Medications (Other):  Marland Kitchen  Carboxymethylcellulose Sodium (EYE DROPS OP), Place 1 drop into both eyes daily as needed (dry eyes). Marland Kitchen  diclofenac sodium (VOLTAREN) 1 % GEL, Apply 2 g topically 4 (four) times daily. Marland Kitchen  ESTRACE VAGINAL 0.1 MG/GM vaginal cream,  .  gabapentin (NEURONTIN) 100 MG capsule, TAKE 2 CAPSULES BY MOUTH AT BEDTIME .  hydroxychloroquine (PLAQUENIL) 200 MG tablet, Take 400 mg by mouth daily.  .  Misc Natural Products (TART CHERRY ADVANCED PO), Take 1 tablet by mouth daily. .  Multiple Vitamins-Calcium (ONE-A-DAY WOMENS PO), Take 1 tablet by mouth daily. .  potassium chloride (K-DUR,KLOR-CON) 10 MEQ tablet, Take 10 mEq by mouth daily. .  traZODone (DESYREL) 50 MG tablet, Take 50-150 mg by mouth at bedtime.  .  TURMERIC PO, Take 1 tablet by mouth daily. .  Vitamin D, Ergocalciferol, (DRISDOL) 1.25 MG (50000  UNIT) CAPS capsule, TAKE 1 CAPSULE BY MOUTH ONE TIME PER WEEK   Reviewed prior external information including notes and imaging from  primary care provider As well as notes that were available from care everywhere and other healthcare systems.  Past medical history, social, surgical and family history all reviewed in electronic medical record.  No pertanent information unless stated regarding to the chief complaint.   Review of Systems:  No headache, visual changes, nausea, vomiting, diarrhea, constipation, dizziness, abdominal pain, skin rash, fevers, chills, night sweats, weight loss, swollen lymph nodes, body aches, joint swelling, chest pain, shortness of breath, mood changes. POSITIVE muscle aches  Objective  Blood pressure 120/78, pulse 85, height 5\' 6"  (1.676 m), weight 132 lb (  59.9 kg), SpO2 98 %.   General: No apparent distress alert and oriented x3 mood and affect normal, dressed appropriately.  HEENT: Pupils equal, extraocular movements intact  Respiratory: Patient's speak in full sentences and does not appear short of breath  Cardiovascular: No lower extremity edema, non tender, no erythema  Skin: Warm dry intact with no signs of infection or rash on extremities or on axial skeleton.  Abdomen: Soft nontender  Neuro: Cranial nerves II through XII are intact, neurovascularly intact in all extremities with 2+ DTRs and 2+ pulses.  Lymph: No lymphadenopathy of posterior or anterior cervical chain or axillae bilaterally.  Gait normal with good balance and coordination.  MSK: Arthritic changes of multiple joints.  Patient's back does have some degenerative scoliosis noted.  Lacks the last 10 degrees of extension in all planes as well as sidebending and rotation as well.  Osteopathic findings C2 flexed rotated and side bent right T3 extended rotated and side bent right inhaled third rib T5-9 neutral rotated right side bent left L2 flexed rotated and side bent right Sacrum right on  right     Impression and Recommendations:     This case required medical decision making of moderate complexity. The above documentation has been reviewed and is accurate and complete Lyndal Pulley, DO       Note: This dictation was prepared with Dragon dictation along with smaller phrase technology. Any transcriptional errors that result from this process are unintentional.

## 2019-06-21 NOTE — Assessment & Plan Note (Signed)
Chronic problem with exacerbation.  Discussed with patient about posture and ergonomics, which activities to do which wants to avoid.  Patient should increase activity as tolerated.  Discussed icing regimen.  Follow-up again in 4 to 8 weeks.

## 2019-06-21 NOTE — Assessment & Plan Note (Signed)
Decision today to treat with OMT was based on Physical Exam  After verbal consent patient was treated with HVLA, ME, FPR techniques in cervical, thoracic, rib,  lumbar and sacral areas  Patient tolerated the procedure well with improvement in symptoms  Patient given exercises, stretches and lifestyle modifications  See medications in patient instructions if given  Patient will follow up in 4-8 weeks 

## 2019-07-13 DIAGNOSIS — H35363 Drusen (degenerative) of macula, bilateral: Secondary | ICD-10-CM | POA: Diagnosis not present

## 2019-07-13 DIAGNOSIS — Z79899 Other long term (current) drug therapy: Secondary | ICD-10-CM | POA: Diagnosis not present

## 2019-07-14 DIAGNOSIS — N183 Chronic kidney disease, stage 3 unspecified: Secondary | ICD-10-CM | POA: Diagnosis not present

## 2019-07-14 DIAGNOSIS — I10 Essential (primary) hypertension: Secondary | ICD-10-CM | POA: Diagnosis not present

## 2019-07-14 DIAGNOSIS — E559 Vitamin D deficiency, unspecified: Secondary | ICD-10-CM | POA: Diagnosis not present

## 2019-07-19 ENCOUNTER — Ambulatory Visit: Payer: PPO | Admitting: Family Medicine

## 2019-07-19 ENCOUNTER — Encounter: Payer: Self-pay | Admitting: Family Medicine

## 2019-07-19 ENCOUNTER — Other Ambulatory Visit: Payer: Self-pay

## 2019-07-19 VITALS — BP 142/80 | HR 89 | Ht 66.0 in | Wt 137.0 lb

## 2019-07-19 DIAGNOSIS — M48061 Spinal stenosis, lumbar region without neurogenic claudication: Secondary | ICD-10-CM

## 2019-07-19 DIAGNOSIS — M999 Biomechanical lesion, unspecified: Secondary | ICD-10-CM | POA: Diagnosis not present

## 2019-07-19 NOTE — Assessment & Plan Note (Addendum)
Chronic problem with exacerbation.  Patient does have significant amount of degenerative disc disease and the underlying autoimmune disease which is a social determinants of health.  Does keep her from doing the exercises on a regular basis.  Discussed home exercises and icing regimen.  Discussed topical anti-inflammatories and icing regimen.  Follow-up again in 4 to 8 weeks

## 2019-07-19 NOTE — Patient Instructions (Signed)
Sorry for your loss See me again in 4 weeks

## 2019-07-19 NOTE — Progress Notes (Signed)
[place] Phone: [phone] Subjective:   [name], am serving as a scribe for Dr. Hulan Saas. This visit occurred during the SARS-CoV-2 public health emergency.  Safety protocols were in place, including screening questions prior to the visit, additional usage of staff PPE, and extensive cleaning of exam room while observing appropriate contact time as indicated for disinfecting solutions.   I'm seeing this patient by the request  of:  Gloria Austin, MD (Inactive)  CC: Low back pain  [id number]  Gloria [name] is a 74 y.o. female coming in with complaint of back pain. Last seen on 06/21/2019 for OMT. States that back has been achy. Has been able to play pickleball and stay active despite pain.  Patient has known degenerative disc disease of the lumbar spine.  Patient is trying to stay active but finds it difficult with patient trying to do social distancing to a mild degree.  Past Medical History:  Diagnosis Date  . Anxiety   . Arthritis   . CKD (chronic kidney disease), stage III   . Complication of anesthesia 05/2014   had body shaking-observed over night-see anesthesia note-has had tremors post op in past  . Depression   . Hyperlipemia   . Hypertension   . Insomnia    Past Surgical History:  Procedure Laterality Date  . ABDOMINAL HYSTERECTOMY     age 67  . BACK SURGERY    . CARPAL TUNNEL RELEASE  01/20/2012   Procedure: CARPAL TUNNEL RELEASE;  Surgeon: Wynonia Sours, MD;  Location: [place];  Service: Orthopedics;  Laterality: Right;  . CARPAL TUNNEL RELEASE  02/25/2012   Procedure: CARPAL TUNNEL RELEASE;  Surgeon: Wynonia Sours, MD;  Location: [place];  Service: Orthopedics;  Laterality: Left;  RELEASE A-1 PULLEY LRF  . COLONOSCOPY WITH PROPOFOL N/A 10/15/2016   Procedure: COLONOSCOPY WITH PROPOFOL;  Surgeon: Wilford Corner, MD;  Location: [place];   Service: Endoscopy;  Laterality: N/A;  . DISTAL BICEPS TENDON REPAIR Left 06/12/2015   Procedure: REPAIR LEFT BICEPS TENDON AND DECOMPRESSION OF MEDIAN NERVE;  Surgeon: Daryll Brod, MD;  Location: [facility];  Service: Orthopedics;  Laterality: Left;  . SMALL INTESTINE SURGERY    . SMALL INTESTINE SURGERY     has had 6 total bowel obstructions -mostlt adhesions-last 2009  . TONSILLECTOMY     age 37  . TRIGGER FINGER RELEASE  2/12   rt middle  . TRIGGER FINGER RELEASE  2010   lt middle  . ULNAR NERVE TRANSPOSITION Right 05/11/2014   Procedure: DECOMPRESSION ULNAR NERVE RIGHT ELBOW ;  Surgeon: Daryll Brod, MD;  Location: [place];  Service: Orthopedics;  Laterality: Right;  . ULNAR NERVE TRANSPOSITION Left 07/06/2014   Procedure: DECOMPRESSION  LEFT ULNAR NERVE;  Surgeon: Daryll Brod, MD;  Location: [place];  Service: Orthopedics;  Laterality: Left;   Social History   Socioeconomic History  . Marital status: Married    Spouse name: Not on file  . Number of children: Not on file  . Years of education: Not on file  . Highest education level: Not on file  Occupational History  . Not on file  Tobacco Use  . Smoking status: Never Smoker  . Smokeless tobacco: Never Used  Substance and Sexual Activity  . Alcohol use: No  . Drug use: No  . Sexual activity: Not on file  Other  Topics Concern  . Not on file  Social History Narrative  . Not on file   Social Determinants of Health   Financial Resource Strain:   . Difficulty of Paying Living Expenses:   Food Insecurity:   . Worried About Charity fundraiser in the Last Year:   . Arboriculturist in the Last Year:   Transportation Needs:   . Film/video editor (Medical):   Marland Kitchen Lack of Transportation (Non-Medical):   Physical Activity:   . Days of Exercise per Week:   . Minutes of Exercise per Session:   Stress:   . Feeling of Stress :   Social Connections:   . Frequency of Communication  with Friends and Family:   . Frequency of Social Gatherings with Friends and Family:   . Attends Religious Services:   . Active Member of Clubs or Organizations:   . Attends Archivist Meetings:   Marland Kitchen Marital Status:    Allergies  Allergen Reactions  . Versed [Midazolam] Other (See Comments)  . Diprivan [Propofol] Other (See Comments)    Uncontrolled shaking  . Fentanyl   . Sevoflurane   . Zofran [Ondansetron Hcl]    Family History  Problem Relation Age of Onset  . Heart failure Father     Current Outpatient Medications (Endocrine & Metabolic):  .  alendronate (FOSAMAX) 10 MG tablet, Take 10 mg by mouth daily before breakfast. Take with a full glass of water on an empty stomach. .  predniSONE (DELTASONE) 50 MG tablet, Take one tablet daily for the next 5 days.  Current Outpatient Medications (Cardiovascular):  .  hydrochlorothiazide (MICROZIDE) 12.5 MG capsule, Take 12.5 mg by mouth daily.   Current Outpatient Medications (Analgesics):  .  aspirin 81 MG tablet, Take 81 mg by mouth daily.   Current Outpatient Medications (Other):  Marland Kitchen  Carboxymethylcellulose Sodium (EYE DROPS OP), Place 1 drop into both eyes daily as needed (dry eyes). Marland Kitchen  diclofenac sodium (VOLTAREN) 1 % GEL, Apply 2 g topically 4 (four) times daily. Marland Kitchen  ESTRACE VAGINAL 0.1 MG/GM vaginal cream,  .  gabapentin (NEURONTIN) 100 MG capsule, TAKE 2 CAPSULES BY MOUTH AT BEDTIME .  Misc Natural Products (TART CHERRY ADVANCED PO), Take 1 tablet by mouth daily. .  Multiple Vitamins-Calcium (ONE-A-DAY WOMENS PO), Take 1 tablet by mouth daily. .  potassium chloride (K-DUR,KLOR-CON) 10 MEQ tablet, Take 10 mEq by mouth daily. .  traZODone (DESYREL) 50 MG tablet, Take 50-150 mg by mouth at bedtime.  .  TURMERIC PO, Take 1 tablet by mouth daily. .  Vitamin D, Ergocalciferol, (DRISDOL) 1.25 MG (50000 UNIT) CAPS capsule, TAKE 1 CAPSULE BY MOUTH ONE TIME PER WEEK   Reviewed prior external information including notes  and imaging from  primary care provider As well as notes that were available from care everywhere and other healthcare systems.  Past medical history, social, surgical and family history all reviewed in electronic medical record.  No pertanent information unless stated regarding to the chief complaint.   Review of Systems:  No headache, visual changes, nausea, vomiting, diarrhea, constipation, dizziness, abdominal pain, skin rash, fevers, chills, night sweats, weight loss, swollen lymph nodes, body aches, joint swelling, chest pain, shortness of breath, mood changes. POSITIVE muscle aches  Objective  Blood pressure (!) 142/80, pulse 89, height 5\' 6"  (1.676 m), weight 137 lb (62.1 kg), SpO2 98 %.   General: No apparent distress alert and oriented x3 mood and affect normal, dressed appropriately.  HEENT: Pupils equal, extraocular movements intact  Respiratory: Patient's speak in full sentences and does not appear short of breath  Cardiovascular: No lower extremity edema, non tender, no erythema  Skin: Warm dry intact with no signs of infection or rash on extremities or on axial skeleton.  Abdomen: Soft nontender  Neuro: Cranial nerves II through XII are intact, neurovascularly intact in all extremities with 2+ DTRs and 2+ pulses.  Lymph: No lymphadenopathy of posterior or anterior cervical chain or axillae bilaterally.  Gait normal with good balance and coordination.  MSK: Patient does have significant scoliosis noted and loss of lordosis mostly in the thoracolumbar and lumbosacral juncture.  Tender to palpation still over the sacroiliac joint.  Mild increase in tightness of the FABER test than usual.  No true radicular symptoms with some mild worsening pain with any type of extension greater than 10 degrees of the lumbar spine.  Osteopathic findings C2 flexed rotated and side bent right T3 extended rotated and side bent right inhaled third rib T7 extended rotated and side bent left L1-L4  neutral rotated left side bent right Sacrum right on right     Impression and Recommendations:     This case required medical decision making of moderate complexity. The above documentation has been reviewed and is accurate and complete Lyndal Pulley, DO       Note: This dictation was prepared with Dragon dictation along with smaller phrase technology. Any transcriptional errors that result from this process are unintentional.

## 2019-07-19 NOTE — Assessment & Plan Note (Signed)
Decision today to treat with OMT was based on Physical Exam  After verbal consent patient was treated with HVLA, ME, FPR techniques in cervical, thoracic, rib,  lumbar and sacral areas  Patient tolerated the procedure well with improvement in symptoms  Patient given exercises, stretches and lifestyle modifications  See medications in patient instructions if given  Patient will follow up in 4-8 weeks 

## 2019-08-03 DIAGNOSIS — H353132 Nonexudative age-related macular degeneration, bilateral, intermediate dry stage: Secondary | ICD-10-CM | POA: Diagnosis not present

## 2019-08-03 DIAGNOSIS — Z79899 Other long term (current) drug therapy: Secondary | ICD-10-CM | POA: Diagnosis not present

## 2019-08-03 DIAGNOSIS — H43813 Vitreous degeneration, bilateral: Secondary | ICD-10-CM | POA: Diagnosis not present

## 2019-08-16 ENCOUNTER — Ambulatory Visit: Payer: PPO | Admitting: Family Medicine

## 2019-08-16 ENCOUNTER — Encounter: Payer: Self-pay | Admitting: Family Medicine

## 2019-08-16 ENCOUNTER — Other Ambulatory Visit: Payer: Self-pay

## 2019-08-16 VITALS — BP 160/90 | HR 76 | Ht 66.0 in | Wt 143.0 lb

## 2019-08-16 DIAGNOSIS — M999 Biomechanical lesion, unspecified: Secondary | ICD-10-CM | POA: Diagnosis not present

## 2019-08-16 DIAGNOSIS — M48061 Spinal stenosis, lumbar region without neurogenic claudication: Secondary | ICD-10-CM

## 2019-08-16 NOTE — Assessment & Plan Note (Signed)
Chronic problem with exacerbation.  Discussed with the patient about core stability.  Discussed medications including gabapentin this is a chronic medication as well.  Discussed home exercises.  Patient is doing more activity outside and I think he needs to stay more hydrated.  Follow-up again in 4 to 6 weeks.

## 2019-08-16 NOTE — Assessment & Plan Note (Signed)

## 2019-08-16 NOTE — Patient Instructions (Signed)
Good to see you  [facility] [phone] Phone Number [facility]  [phone] Stay hydrated 1/4 cup of gatorade per cup of water See me again in 4-6 weeks

## 2019-08-16 NOTE — Progress Notes (Signed)
[place] [address] [place] Phone: [phone] Subjective:   I Gloria [name] am serving as a Education administrator for Dr. Hulan Saas.  This visit occurred during the SARS-CoV-2 public health emergency.  Safety protocols were in place, including screening questions prior to the visit, additional usage of staff PPE, and extensive cleaning of exam room while observing appropriate contact time as indicated for disinfecting solutions.   I'm seeing this patient by the request  of:  [name], MD (Inactive)  CC: Low back pain follow-up  [id number]  Gloria [name] is a 74 y.o. female coming in with complaint of back pain. Last seen on 07/19/2019. Patient states last night left leg was achy. Patient recently diagnosed by with glaucoma.  Continues to try to stay active.  Patient states that the yard work seem to cause some mild aggravation recently.  No radiation of pain.  Just more tightness than usual.    Past Medical History:  Diagnosis Date  . Anxiety   . Arthritis   . CKD (chronic kidney disease), stage III   . Complication of anesthesia 05/2014   had body shaking-observed over night-see anesthesia note-has had tremors post op in past  . Depression   . Hyperlipemia   . Hypertension   . Insomnia    Past Surgical History:  Procedure Laterality Date  . ABDOMINAL HYSTERECTOMY     age 14  . BACK SURGERY    . CARPAL TUNNEL RELEASE  01/20/2012   Procedure: CARPAL TUNNEL RELEASE;  Surgeon: Wynonia Sours, MD;  Location: [facility];  Service: Orthopedics;  Laterality: Right;  . CARPAL TUNNEL RELEASE  02/25/2012   Procedure: CARPAL TUNNEL RELEASE;  Surgeon: Wynonia Sours, MD;  Location: [facility];  Service: Orthopedics;  Laterality: Left;  RELEASE A-1 PULLEY LRF  . COLONOSCOPY WITH PROPOFOL N/A 10/15/2016   Procedure: COLONOSCOPY WITH PROPOFOL;  Surgeon: Wilford Corner, MD;  Location: [place];  Service:  Endoscopy;  Laterality: N/A;  . DISTAL BICEPS TENDON REPAIR Left 06/12/2015   Procedure: REPAIR LEFT BICEPS TENDON AND DECOMPRESSION OF MEDIAN NERVE;  Surgeon: Daryll Brod, MD;  Location: [facility];  Service: Orthopedics;  Laterality: Left;  . SMALL INTESTINE SURGERY    . SMALL INTESTINE SURGERY     has had 6 total bowel obstructions -mostlt adhesions-last 2009  . TONSILLECTOMY     age 5  . TRIGGER FINGER RELEASE  2/12   rt middle  . TRIGGER FINGER RELEASE  2010   lt middle  . ULNAR NERVE TRANSPOSITION Right 05/11/2014   Procedure: DECOMPRESSION ULNAR NERVE RIGHT ELBOW ;  Surgeon: Daryll Brod, MD;  Location: [place];  Service: Orthopedics;  Laterality: Right;  . ULNAR NERVE TRANSPOSITION Left 07/06/2014   Procedure: DECOMPRESSION  LEFT ULNAR NERVE;  Surgeon: Daryll Brod, MD;  Location: [place];  Service: Orthopedics;  Laterality: Left;   Social History   Socioeconomic History  . Marital status: Married    Spouse name: Not on file  . Number of children: Not on file  . Years of education: Not on file  . Highest education level: Not on file  Occupational History  . Not on file  Tobacco Use  . Smoking status: Never Smoker  . Smokeless tobacco: Never Used  Substance and Sexual Activity  . Alcohol use: No  . Drug use: No  . Sexual activity: Not on file  Other Topics Concern  .  Not on file  Social History Narrative  . Not on file   Social Determinants of Health   Financial Resource Strain:   . Difficulty of Paying Living Expenses:   Food Insecurity:   . Worried About Charity fundraiser in the Last Year:   . Arboriculturist in the Last Year:   Transportation Needs:   . Film/video editor (Medical):   Marland Kitchen Lack of Transportation (Non-Medical):   Physical Activity:   . Days of Exercise per Week:   . Minutes of Exercise per Session:   Stress:   . Feeling of Stress :   Social Connections:   . Frequency of Communication with  Friends and Family:   . Frequency of Social Gatherings with Friends and Family:   . Attends Religious Services:   . Active Member of Clubs or Organizations:   . Attends Archivist Meetings:   Marland Kitchen Marital Status:    Allergies  Allergen Reactions  . Versed [Midazolam] Other (See Comments)  . Diprivan [Propofol] Other (See Comments)    Uncontrolled shaking  . Fentanyl   . Sevoflurane   . Zofran [Ondansetron Hcl]    Family History  Problem Relation Age of Onset  . Heart failure Father     Current Outpatient Medications (Endocrine & Metabolic):  .  alendronate (FOSAMAX) 10 MG tablet, Take 10 mg by mouth daily before breakfast. Take with a full glass of water on an empty stomach. .  predniSONE (DELTASONE) 50 MG tablet, Take one tablet daily for the next 5 days.  Current Outpatient Medications (Cardiovascular):  .  hydrochlorothiazide (MICROZIDE) 12.5 MG capsule, Take 12.5 mg by mouth daily.   Current Outpatient Medications (Analgesics):  .  aspirin 81 MG tablet, Take 81 mg by mouth daily.   Current Outpatient Medications (Other):  Marland Kitchen  Carboxymethylcellulose Sodium (EYE DROPS OP), Place 1 drop into both eyes daily as needed (dry eyes). Marland Kitchen  diclofenac sodium (VOLTAREN) 1 % GEL, Apply 2 g topically 4 (four) times daily. Marland Kitchen  ESTRACE VAGINAL 0.1 MG/GM vaginal cream,  .  gabapentin (NEURONTIN) 100 MG capsule, TAKE 2 CAPSULES BY MOUTH AT BEDTIME .  Misc Natural Products (TART CHERRY ADVANCED PO), Take 1 tablet by mouth daily. .  Multiple Vitamins-Calcium (ONE-A-DAY WOMENS PO), Take 1 tablet by mouth daily. .  potassium chloride (K-DUR,KLOR-CON) 10 MEQ tablet, Take 10 mEq by mouth daily. .  traZODone (DESYREL) 50 MG tablet, Take 50-150 mg by mouth at bedtime.  .  TURMERIC PO, Take 1 tablet by mouth daily. .  Vitamin D, Ergocalciferol, (DRISDOL) 1.25 MG (50000 UNIT) CAPS capsule, TAKE 1 CAPSULE BY MOUTH ONE TIME PER WEEK   Reviewed prior external information including notes and  imaging from  primary care provider As well as notes that were available from care everywhere and other healthcare systems.  Past medical history, social, surgical and family history all reviewed in electronic medical record.  No pertanent information unless stated regarding to the chief complaint.   Review of Systems:  No headache, visual changes, nausea, vomiting, diarrhea, constipation, dizziness, abdominal pain, skin rash, fevers, chills, night sweats, weight loss, swollen lymph nodes, body aches, joint swelling, chest pain, shortness of breath, mood changes. POSITIVE muscle aches  Objective  Blood pressure (!) 160/90, pulse 76, height 5\' 6"  (1.676 m), weight 143 lb (64.9 kg), SpO2 98 %.   General: No apparent distress alert and oriented x3 mood and affect normal, dressed appropriately.  HEENT: Pupils equal, extraocular  movements intact  Respiratory: Patient's speak in full sentences and does not appear short of breath  Cardiovascular: No lower extremity edema, non tender, no erythema  Neuro: Cranial nerves II through XII are intact, neurovascularly intact in all extremities with 2+ DTRs and 2+ pulses.  Gait normal with good balance and coordination.  MSK:   Patient's back shows significant scoliosis noted.  Tightness noted in the paraspinal musculature lumbar spine and the right sacroiliac joint.  No significant radiation with straight leg test but tightness with Corky Sox test bilaterally.  Deep tendon reflexes intact arthritic changes of multiple joints noted  Osteopathic findings  C7 flexed rotated and side bent left T4 extended rotated and side bent right inhaled rib T5 extended rotated and side bent left L1 flexed rotated and side bent right Sacrum right on right    Impression and Recommendations:     This case required medical decision making of moderate complexity. The above documentation has been reviewed and is accurate and complete Lyndal Pulley, DO       Note:  This dictation was prepared with Dragon dictation along with smaller phrase technology. Any transcriptional errors that result from this process are unintentional.

## 2019-08-19 DIAGNOSIS — I10 Essential (primary) hypertension: Secondary | ICD-10-CM | POA: Diagnosis not present

## 2019-08-30 DIAGNOSIS — M069 Rheumatoid arthritis, unspecified: Secondary | ICD-10-CM | POA: Diagnosis not present

## 2019-08-30 DIAGNOSIS — H353131 Nonexudative age-related macular degeneration, bilateral, early dry stage: Secondary | ICD-10-CM | POA: Diagnosis not present

## 2019-08-30 DIAGNOSIS — H43813 Vitreous degeneration, bilateral: Secondary | ICD-10-CM | POA: Diagnosis not present

## 2019-08-30 DIAGNOSIS — Z79899 Other long term (current) drug therapy: Secondary | ICD-10-CM | POA: Diagnosis not present

## 2019-08-30 DIAGNOSIS — Z961 Presence of intraocular lens: Secondary | ICD-10-CM | POA: Diagnosis not present

## 2019-09-06 ENCOUNTER — Other Ambulatory Visit: Payer: Self-pay | Admitting: Family Medicine

## 2019-09-14 ENCOUNTER — Encounter: Payer: Self-pay | Admitting: Family Medicine

## 2019-09-14 ENCOUNTER — Ambulatory Visit: Payer: PPO | Admitting: Family Medicine

## 2019-09-14 ENCOUNTER — Other Ambulatory Visit: Payer: Self-pay

## 2019-09-14 VITALS — BP 116/82 | HR 80 | Ht 66.0 in | Wt 132.0 lb

## 2019-09-14 DIAGNOSIS — M999 Biomechanical lesion, unspecified: Secondary | ICD-10-CM

## 2019-09-14 DIAGNOSIS — M069 Rheumatoid arthritis, unspecified: Secondary | ICD-10-CM | POA: Diagnosis not present

## 2019-09-14 DIAGNOSIS — M48061 Spinal stenosis, lumbar region without neurogenic claudication: Secondary | ICD-10-CM | POA: Diagnosis not present

## 2019-09-14 NOTE — Assessment & Plan Note (Signed)

## 2019-09-14 NOTE — Assessment & Plan Note (Signed)
Contributing to some of the aches and pains as well.

## 2019-09-14 NOTE — Patient Instructions (Signed)
See me in 4-5 weeks 

## 2019-09-14 NOTE — Assessment & Plan Note (Signed)
Chronic problem with mild exacerbation.  We discussed different medications.  Doing topical anti-inflammatories, gabapentin at night has been prednisone in the past.  Responding well to manipulation.  Follow-up again in 4 to 6 weeks

## 2019-09-14 NOTE — Progress Notes (Signed)
[place] Phone: [phone] Subjective:   [name], am serving as a scribe for Dr. Hulan Saas. This visit occurred during the SARS-CoV-2 public health emergency.  Safety protocols were in place, including screening questions prior to the visit, additional usage of staff PPE, and extensive cleaning of exam room while observing appropriate contact time as indicated for disinfecting solutions.   I'm seeing this patient by the request  of:  [name], MD (Inactive)  CC: Low back pain follow-up  [id number]  Catasha Banwart Melander is a 74 y.o. female coming in with complaint of back pain. Last seen on 08/16/2019 for OMT. Patient states she road to [facility] 2 weeks ago and developed right shoulder started to ache. Patient has been using Pennsaid gel which has helped to alleviate her pain. Stiffness in right cervical spine since shoulder pain developed.     Past Medical History:  Diagnosis Date  . Anxiety   . Arthritis   . CKD (chronic kidney disease), stage III   . Complication of anesthesia 05/2014   had body shaking-observed over night-see anesthesia note-has had tremors post op in past  . Depression   . Hyperlipemia   . Hypertension   . Insomnia    Past Surgical History:  Procedure Laterality Date  . ABDOMINAL HYSTERECTOMY     age 18  . BACK SURGERY    . CARPAL TUNNEL RELEASE  01/20/2012   Procedure: CARPAL TUNNEL RELEASE;  Surgeon: Wynonia Sours, MD;  Location: [place];  Service: Orthopedics;  Laterality: Right;  . CARPAL TUNNEL RELEASE  02/25/2012   Procedure: CARPAL TUNNEL RELEASE;  Surgeon: Wynonia Sours, MD;  Location: [place];  Service: Orthopedics;  Laterality: Left;  RELEASE A-1 PULLEY LRF  . COLONOSCOPY WITH PROPOFOL N/A 10/15/2016   Procedure: COLONOSCOPY WITH PROPOFOL;  Surgeon: Wilford Corner, MD;  Location: [place];  Service: Endoscopy;  Laterality: N/A;  .  DISTAL BICEPS TENDON REPAIR Left 06/12/2015   Procedure: REPAIR LEFT BICEPS TENDON AND DECOMPRESSION OF MEDIAN NERVE;  Surgeon: Daryll Brod, MD;  Location: [place];  Service: Orthopedics;  Laterality: Left;  . SMALL INTESTINE SURGERY    . SMALL INTESTINE SURGERY     has had 6 total bowel obstructions -mostlt adhesions-last 2009  . TONSILLECTOMY     age 39  . TRIGGER FINGER RELEASE  2/12   rt middle  . TRIGGER FINGER RELEASE  2010   lt middle  . ULNAR NERVE TRANSPOSITION Right 05/11/2014   Procedure: DECOMPRESSION ULNAR NERVE RIGHT ELBOW ;  Surgeon: Daryll Brod, MD;  Location: [place];  Service: Orthopedics;  Laterality: Right;  . ULNAR NERVE TRANSPOSITION Left 07/06/2014   Procedure: DECOMPRESSION  LEFT ULNAR NERVE;  Surgeon: Daryll Brod, MD;  Location: [place];  Service: Orthopedics;  Laterality: Left;   Social History   Socioeconomic History  . Marital status: Married    Spouse name: Not on file  . Number of children: Not on file  . Years of education: Not on file  . Highest education level: Not on file  Occupational History  . Not on file  Tobacco Use  . Smoking status: Never Smoker  . Smokeless tobacco: Never Used  Substance and Sexual Activity  . Alcohol use: No  . Drug use: No  . Sexual activity: Not on file  Other Topics Concern  . Not on file  Social History Narrative  . Not on file   Social Determinants of Health   Financial Resource Strain:   . Difficulty of Paying Living Expenses:   Food Insecurity:   . Worried About Charity fundraiser in the Last Year:   . Arboriculturist in the Last Year:   Transportation Needs:   . Film/video editor (Medical):   Marland Kitchen Lack of Transportation (Non-Medical):   Physical Activity:   . Days of Exercise per Week:   . Minutes of Exercise per Session:   Stress:   . Feeling of Stress :   Social Connections:   . Frequency of Communication with Friends and Family:   . Frequency of  Social Gatherings with Friends and Family:   . Attends Religious Services:   . Active Member of Clubs or Organizations:   . Attends Archivist Meetings:   Marland Kitchen Marital Status:    Allergies  Allergen Reactions  . Versed [Midazolam] Other (See Comments)  . Diprivan [Propofol] Other (See Comments)    Uncontrolled shaking  . Fentanyl   . Sevoflurane   . Zofran [Ondansetron Hcl]    Family History  Problem Relation Age of Onset  . Heart failure Father     Current Outpatient Medications (Endocrine & Metabolic):  .  alendronate (FOSAMAX) 10 MG tablet, Take 10 mg by mouth daily before breakfast. Take with a full glass of water on an empty stomach. .  predniSONE (DELTASONE) 50 MG tablet, Take one tablet daily for the next 5 days.  Current Outpatient Medications (Cardiovascular):  .  hydrochlorothiazide (MICROZIDE) 12.5 MG capsule, Take 12.5 mg by mouth daily.   Current Outpatient Medications (Analgesics):  .  aspirin 81 MG tablet, Take 81 mg by mouth daily.   Current Outpatient Medications (Other):  Marland Kitchen  Carboxymethylcellulose Sodium (EYE DROPS OP), Place 1 drop into both eyes daily as needed (dry eyes). Marland Kitchen  diclofenac sodium (VOLTAREN) 1 % GEL, Apply 2 g topically 4 (four) times daily. Marland Kitchen  ESTRACE VAGINAL 0.1 MG/GM vaginal cream,  .  gabapentin (NEURONTIN) 100 MG capsule, TAKE 2 CAPSULES BY MOUTH AT BEDTIME .  Misc Natural Products (TART CHERRY ADVANCED PO), Take 1 tablet by mouth daily. .  Multiple Vitamins-Calcium (ONE-A-DAY WOMENS PO), Take 1 tablet by mouth daily. .  potassium chloride (K-DUR,KLOR-CON) 10 MEQ tablet, Take 10 mEq by mouth daily. .  traZODone (DESYREL) 50 MG tablet, Take 50-150 mg by mouth at bedtime.  .  TURMERIC PO, Take 1 tablet by mouth daily. .  Vitamin D, Ergocalciferol, (DRISDOL) 1.25 MG (50000 UNIT) CAPS capsule, TAKE 1 CAPSULE BY MOUTH ONE TIME PER WEEK   Reviewed prior external information including notes and imaging from  primary care provider As  well as notes that were available from care everywhere and other healthcare systems.  Past medical history, social, surgical and family history all reviewed in electronic medical record.  No pertanent information unless stated regarding to the chief complaint.   Review of Systems:  No headache, visual changes, nausea, vomiting, diarrhea, constipation, dizziness, abdominal pain, skin rash, fevers, chills, night sweats, weight loss, swollen lymph nodes, body aches, joint swelling, chest pain, shortness of breath, mood changes. POSITIVE muscle aches  Objective  Blood pressure 116/82, pulse 80, height 5\' 6"  (1.676 m), weight 132 lb (59.9 kg), SpO2 97 %.   General: No apparent distress alert and oriented x3 mood and affect normal, dressed appropriately.  HEENT: Pupils equal, extraocular movements intact  Respiratory: Patient's  speak in full sentences and does not appear short of breath  Cardiovascular: No lower extremity edema, non tender, no erythema  Neuro: Cranial nerves II through XII are intact, neurovascularly intact in all extremities with 2+ DTRs and 2+ pulses.  Gait normal with good balance and coordination.  MSK: Arthritic changes in the rheumatoid changes of multiple joints Low back exam does have some degenerative scoliosis noted.  Some mild loss of lordosis.  Some tightness with FABER test bilaterally.  Negative straight leg test.  Neck exam moderately stiffer on the right side than previous exam.  Negative Spurling's though.  Limited side bending and rotation to the right.  Patient does have some pain with the last 5 degrees of extension on the right.  Osteopathic findings C2 flexed rotated and side bent right C6 flexed rotated and side bent left T3 extended rotated and side bent right inhaled third rib T9 extended rotated and side bent left L2 flexed rotated and side bent right Sacrum right on right    Impression and Recommendations:     This case required medical decision  making of moderate complexity. The above documentation has been reviewed and is accurate and complete Lyndal Pulley, DO       Note: This dictation was prepared with Dragon dictation along with smaller phrase technology. Any transcriptional errors that result from this process are unintentional.

## 2019-09-19 DIAGNOSIS — M2550 Pain in unspecified joint: Secondary | ICD-10-CM | POA: Diagnosis not present

## 2019-09-19 DIAGNOSIS — Z79899 Other long term (current) drug therapy: Secondary | ICD-10-CM | POA: Diagnosis not present

## 2019-09-19 DIAGNOSIS — M150 Primary generalized (osteo)arthritis: Secondary | ICD-10-CM | POA: Diagnosis not present

## 2019-09-19 DIAGNOSIS — M0609 Rheumatoid arthritis without rheumatoid factor, multiple sites: Secondary | ICD-10-CM | POA: Diagnosis not present

## 2019-09-19 DIAGNOSIS — Z6821 Body mass index (BMI) 21.0-21.9, adult: Secondary | ICD-10-CM | POA: Diagnosis not present

## 2019-09-21 DIAGNOSIS — Z79899 Other long term (current) drug therapy: Secondary | ICD-10-CM | POA: Diagnosis not present

## 2019-09-21 DIAGNOSIS — H40013 Open angle with borderline findings, low risk, bilateral: Secondary | ICD-10-CM | POA: Diagnosis not present

## 2019-09-21 DIAGNOSIS — H353131 Nonexudative age-related macular degeneration, bilateral, early dry stage: Secondary | ICD-10-CM | POA: Diagnosis not present

## 2019-09-21 DIAGNOSIS — H3562 Retinal hemorrhage, left eye: Secondary | ICD-10-CM | POA: Diagnosis not present

## 2019-10-11 ENCOUNTER — Encounter: Payer: Self-pay | Admitting: Family Medicine

## 2019-10-11 ENCOUNTER — Other Ambulatory Visit: Payer: Self-pay

## 2019-10-11 ENCOUNTER — Ambulatory Visit: Payer: PPO | Admitting: Family Medicine

## 2019-10-11 VITALS — BP 122/72 | HR 75 | Ht 66.0 in | Wt 132.0 lb

## 2019-10-11 DIAGNOSIS — M48061 Spinal stenosis, lumbar region without neurogenic claudication: Secondary | ICD-10-CM | POA: Diagnosis not present

## 2019-10-11 DIAGNOSIS — M999 Biomechanical lesion, unspecified: Secondary | ICD-10-CM | POA: Diagnosis not present

## 2019-10-11 NOTE — Patient Instructions (Signed)
Be aware of positioning with day to day activities See me in 4-6 weeks

## 2019-10-11 NOTE — Progress Notes (Signed)
[place] Phone: [phone] Subjective:   [name], am serving as a scribe for Dr. Hulan Saas. This visit occurred during the SARS-CoV-2 public health emergency.  Safety protocols were in place, including screening questions prior to the visit, additional usage of staff PPE, and extensive cleaning of exam room while observing appropriate contact time as indicated for disinfecting solutions.  I'm seeing this patient by the request  of:  [name], MD (Inactive)  CC: Back pain follow-up with  [id number]  Gloria [name] is a 74 y.o. female coming in with complaint of back and neck pain. OMT 09/14/2019. Patient states that she has been having lateral right hip pain intermittently since last visit.   Medications patient has been prescribed: Gabapentin  Taking: Yes         Reviewed prior external information including notes and imaging from previsou exam, outside providers and external EMR if available.   As well as notes that were available from care everywhere and other healthcare systems.  Past medical history, social, surgical and family history all reviewed in electronic medical record.  No pertanent information unless stated regarding to the chief complaint.   Past Medical History:  Diagnosis Date  . Anxiety   . Arthritis   . CKD (chronic kidney disease), stage III   . Complication of anesthesia 05/2014   had body shaking-observed over night-see anesthesia note-has had tremors post op in past  . Depression   . Hyperlipemia   . Hypertension   . Insomnia     Allergies  Allergen Reactions  . Versed [Midazolam] Other (See Comments)  . Diprivan [Propofol] Other (See Comments)    Uncontrolled shaking  . Fentanyl   . Sevoflurane   . Zofran [Ondansetron Hcl]      Review of Systems:  No headache, visual changes, nausea, vomiting, diarrhea, constipation, dizziness, abdominal pain, skin  rash, fevers, chills, night sweats, weight loss, swollen lymph nodes, body aches, joint swelling, chest pain, shortness of breath, mood changes. POSITIVE muscle aches  Objective  Blood pressure 122/72, pulse 75, height 5\' 6"  (1.676 m), weight 132 lb (59.9 kg), SpO2 97 %.   General: No apparent distress alert and oriented x3 mood and affect normal, dressed appropriately.  HEENT: Pupils equal, extraocular movements intact  Respiratory: Patient's speak in full sentences and does not appear short of breath  Cardiovascular: No lower extremity edema, non tender, no erythema  Neuro: Cranial nerves II through XII are intact, neurovascularly intact in all extremities with 2+ DTRs and 2+ pulses.  Gait normal with good balance and coordination.  MSK:  Non tender with full range of motion and good stability and symmetric strength and tone of shoulders, elbows, wrist, hip, knee and ankles bilaterally.  Back -back exam does have some degenerative scoliosis noted.  Some tightness noted with straight leg test.  Also in good no range of motion with flexion and extension.  Osteopathic findings  C2 flexed rotated and side bent right C7 flexed rotated and side bent left T3 extended rotated and side bent right inhaled rib T9 extended rotated and side bent left L2 flexed rotated and side bent right Sacrum right on right       Assessment and Plan:  Degenerative lumbar spinal stenosis Degenerative lumbar spinal stenosis.  Patient is doing well with conservative therapy.  Patient does have the gabapentin can take it as needed.  Discussed continuing the Fosamax secondary to the  osteoporosis.  Follow-up again in 4 to 6 weeks    Nonallopathic problems  Decision today to treat with OMT was based on Physical Exam  After verbal consent patient was treated with HVLA, ME, FPR techniques in cervical, rib, thoracic, lumbar, and sacral  areas  Patient tolerated the procedure well with improvement in  symptoms  Patient given exercises, stretches and lifestyle modifications  See medications in patient instructions if given  Patient will follow up in 4-8 weeks      The above documentation has been reviewed and is accurate and complete Lyndal Pulley, DO       Note: This dictation was prepared with Dragon dictation along with smaller phrase technology. Any transcriptional errors that result from this process are unintentional.

## 2019-10-11 NOTE — Assessment & Plan Note (Signed)
Degenerative lumbar spinal stenosis.  Patient is doing well with conservative therapy.  Patient does have the gabapentin can take it as needed.  Discussed continuing the Fosamax secondary to the osteoporosis.  Follow-up again in 4 to 6 weeks

## 2019-11-01 ENCOUNTER — Ambulatory Visit: Payer: PPO | Admitting: Family Medicine

## 2019-11-01 ENCOUNTER — Encounter: Payer: Self-pay | Admitting: Family Medicine

## 2019-11-01 VITALS — BP 118/82 | HR 80 | Ht 66.0 in | Wt 131.0 lb

## 2019-11-01 DIAGNOSIS — M999 Biomechanical lesion, unspecified: Secondary | ICD-10-CM | POA: Diagnosis not present

## 2019-11-01 NOTE — Patient Instructions (Signed)
See me again in 6-8 weeks ?

## 2019-11-01 NOTE — Progress Notes (Signed)
[place] Phone: [phone] Subjective:   Gloria Gloria [name], am serving as a scribe for Dr. Hulan Saas. This visit occurred during the SARS-CoV-2 public health emergency.  Safety protocols were in place, including screening questions prior to the visit, additional usage of staff PPE, and extensive cleaning of exam room while observing appropriate contact time as indicated for disinfecting solutions.   I'm seeing this patient by the request  of:  [name], MD (Inactive)  CC: Low back pain follow-up  [id number]  Gloria Gloria [name] is a 74 y.o. female coming in with complaint of back and neck pain. OMT 10/11/2019. Patient states that she had an increase in her pain after mowing. b  Medications patient has been prescribed: Vitamin D once weekly, gabapentin intermittently          Reviewed prior external information including notes and imaging from previsou exam, outside providers and external EMR if available.   As well as notes that were available from care everywhere and other healthcare systems.  Past medical history, social, surgical and family history all reviewed in electronic medical record.  Gloria [name] unless stated regarding to the chief complaint.   Past Medical History:  Diagnosis Date  . Anxiety   . Arthritis   . CKD (chronic kidney disease), stage III   . Complication of anesthesia 05/2014   had body shaking-observed over night-see anesthesia note-has had tremors post op in past  . Depression   . Hyperlipemia   . Hypertension   . Insomnia     Allergies  Allergen Reactions  . Versed [Midazolam] Other (See Comments)  . Diprivan [Propofol] Other (See Comments)    Uncontrolled shaking  . Fentanyl   . Sevoflurane   . Zofran [Ondansetron Hcl]      Review of Systems:  Gloria [name] headache, visual changes, nausea, vomiting, diarrhea, constipation, dizziness, abdominal pain, skin  rash, fevers, chills, night sweats, weight loss, swollen lymph nodes, body aches, joint swelling, chest pain, shortness of breath, mood changes. POSITIVE muscle aches  Objective  There were Gloria [name] vitals taken for this visit.   General: Gloria [name] apparent distress alert and oriented x3 mood and affect normal, dressed appropriately.  HEENT: Pupils equal, extraocular movements intact  Respiratory: Patient's speak in full sentences and does not appear short of breath  Cardiovascular: Gloria [name] lower extremity edema, non tender, Gloria [name] erythema  Neuro: Cranial nerves II through XII are intact, neurovascularly intact in all extremities with 2+ DTRs and 2+ pulses.  Gait normal with good balance and coordination.  MSK:  Non tender with full range of motion and good stability and symmetric strength and tone of shoulders, elbows, wrist, hip, knee and ankles bilaterally.  Back -low back exam does have some degenerative scoliosis noted.  Loss of lordosis.  Mild increase in tightness of the FABER test bilaterally.  Negative though any radicular symptoms with straight leg test though.  Patient does have tenderness diffusely in the paraspinal musculature of the lumbar spine.  Osteopathic findings  C2 flexed rotated and side bent right C7 flexed rotated and side bent left T3 extended rotated and side bent right inhaled rib L2 flexed rotated and side bent right Sacrum right on right       Assessment and Plan:  Degenerative disc disease of lumbar spine.  Chronic problem, mild exacerbation.  Patient doing relatively well with conservative therapy.  Has had to use prednisone in the past and refill today  discussed icing regimen.  Patient will continue to work on core strength.  Follow-up again in 4 to 8 weeks  Nonallopathic problems  Decision today to treat with OMT was based on Physical Exam  After verbal consent patient was treated with HVLA, ME, FPR techniques in cervical, rib, thoracic, lumbar, and sacral   areas  Patient tolerated the procedure well with improvement in symptoms  Patient given exercises, stretches and lifestyle modifications  See medications in patient instructions if given  Patient will follow up in 4-8 weeks      The above documentation has been reviewed and is accurate and complete Gloria Pulley, DO       Note: This dictation was prepared with Dragon dictation along with smaller phrase technology. Any transcriptional errors that result from this process are unintentional.

## 2019-11-15 ENCOUNTER — Other Ambulatory Visit: Payer: Self-pay

## 2019-11-15 ENCOUNTER — Encounter: Payer: Self-pay | Admitting: Family Medicine

## 2019-11-15 ENCOUNTER — Ambulatory Visit: Payer: PPO | Admitting: Family Medicine

## 2019-11-15 VITALS — BP 150/90 | HR 71 | Ht 66.0 in | Wt 132.0 lb

## 2019-11-15 DIAGNOSIS — M48061 Spinal stenosis, lumbar region without neurogenic claudication: Secondary | ICD-10-CM

## 2019-11-15 DIAGNOSIS — M2550 Pain in unspecified joint: Secondary | ICD-10-CM

## 2019-11-15 MED ORDER — METHYLPREDNISOLONE ACETATE 80 MG/ML IJ SUSP
80.0000 mg | Freq: Once | INTRAMUSCULAR | Status: AC
  Administered 2019-11-15: 80 mg via INTRAMUSCULAR

## 2019-11-15 MED ORDER — KETOROLAC TROMETHAMINE 60 MG/2ML IM SOLN
60.0000 mg | Freq: Once | INTRAMUSCULAR | Status: AC
  Administered 2019-11-15: 60 mg via INTRAMUSCULAR

## 2019-11-15 MED ORDER — TIZANIDINE HCL 2 MG PO CAPS: 2.0000 mg | ORAL_CAPSULE | Freq: Three times a day (TID) | ORAL | 3 refills | Status: DC

## 2019-11-15 MED ORDER — PREDNISONE 50 MG PO TABS: ORAL_TABLET | ORAL | 0 refills | Status: DC

## 2019-11-15 NOTE — Patient Instructions (Addendum)
Prednisone sent in start it tomorrow Zanaflex 2mg  sent in take it at night in addition to gabapentin See me again in 2-3 weeks 7/27 @ 10:30a

## 2019-11-15 NOTE — Progress Notes (Signed)
  [place] [address] [place] Phone: [phone] Subjective:   I [name] am serving as a Education administrator for Dr. Hulan Saas.  This visit occurred during the SARS-CoV-2 public health emergency.  Safety protocols were in place, including screening questions prior to the visit, additional usage of staff PPE, and extensive cleaning of exam room while observing appropriate contact time as indicated for disinfecting solutions.   I'm seeing this patient by the request  of:  [name], MD (Inactive)  CC: Low back pain follow-up  [id number]  Gloria [name] is a 74 y.o. female coming in with complaint of back and neck pain. OMT 11/01/2019. Patient states her back started bothering her yesterday. Sharp stabbing pains in the lower back. States she laid on the floor for a few hours and it did not help. States she hasn't had pain like this in 16-17 years. Remembers mowing her lawn 2 weeks yesterday. Some back issues then. States yesterday she could barely walk. Usually patient does respond well to osteopathic manipulation.         Reviewed prior external information including notes and imaging from previsou exam, outside providers and external EMR if available.   As well as notes that were available from care everywhere and other healthcare systems.  Past medical history, social, surgical and family history all reviewed in electronic medical record.  No pertanent information unless stated regarding to the chief complaint.   Past Medical History:  Diagnosis Date  . Anxiety   . Arthritis   . CKD (chronic kidney disease), stage III   . Complication of anesthesia 05/2014   had body shaking-observed over night-see anesthesia note-has had tremors post op in past  . Depression   . Hyperlipemia   . Hypertension   . Insomnia     Allergies  Allergen Reactions  . Versed [Midazolam] Other (See Comments)  . Diprivan [Propofol] Other (See  Comments)    Uncontrolled shaking  . Fentanyl   . Sevoflurane   . Zofran [Ondansetron Hcl]      Review of Systems:  No headache, visual changes, nausea, vomiting, diarrhea, constipation, dizziness, abdominal pain, skin rash, fevers, chills, night sweats, weight loss, swollen lymph nodes, body aches, joint swelling, chest pain, shortness of breath, mood changes. POSITIVE muscle aches  Objective  There were no vitals taken for this visit.   General: No apparent distress alert and oriented x3 mood and affect normal, dressed appropriately.  HEENT: Pupils equal, extraocular movements intact  Respiratory: Patient's speak in full sentences and does not appear short of breath  Cardiovascular: No lower extremity edema, non tender, no erythema  Neuro: Cranial nerves II through XII are intact, neurovascularly intact in all extremities with 2+ DTRs and 2+ pulses.  Gait very cautious Patient is low back exam and has significant decrease in lordosis.  Patient does have the scoliosis noted.  Severe tenderness but has more of a midline tenderness at L5-S1.  Straight leg test significantly tighter        Assessment and Plan:         The above documentation has been reviewed and is accurate and complete Jacqualin Combes       Note: This dictation was prepared with Dragon dictation along with smaller phrase technology. Any transcriptional errors that result from this process are unintentional.

## 2019-11-15 NOTE — Assessment & Plan Note (Addendum)
Chronic problem with exacerbation.  Worsening symptoms at this time.  Short course of prednisone given.  Toradol and Depo-Medrol given today to help with symptom relief sooner.  Discussed icing regimen and home exercise, which activities to do which wants to avoid.  Increase activity slowly.  Follow-up again in 2-3 weeks.

## 2019-11-16 ENCOUNTER — Telehealth: Payer: Self-pay | Admitting: Family Medicine

## 2019-11-16 NOTE — Telephone Encounter (Signed)
Pt called, her instructions were to take the Tizanidine at bedtime, but the bottle from the pharmacy says to take 1 capsule 3x daily. Please call to clarify  Also, pt wanted [place] to know that she feels much better today.

## 2019-11-16 NOTE — Telephone Encounter (Signed)
Spoke with patient in regards to taking medication QHS instead of TID. Patient voices understanding.

## 2019-11-29 ENCOUNTER — Ambulatory Visit: Payer: PPO | Admitting: Family Medicine

## 2019-11-29 ENCOUNTER — Other Ambulatory Visit: Payer: Self-pay

## 2019-11-29 ENCOUNTER — Encounter: Payer: Self-pay | Admitting: Family Medicine

## 2019-11-29 VITALS — BP 126/92 | HR 82 | Ht 66.0 in | Wt 133.0 lb

## 2019-11-29 DIAGNOSIS — M48061 Spinal stenosis, lumbar region without neurogenic claudication: Secondary | ICD-10-CM

## 2019-11-29 DIAGNOSIS — M999 Biomechanical lesion, unspecified: Secondary | ICD-10-CM | POA: Diagnosis not present

## 2019-11-29 DIAGNOSIS — M069 Rheumatoid arthritis, unspecified: Secondary | ICD-10-CM | POA: Diagnosis not present

## 2019-11-29 NOTE — Assessment & Plan Note (Signed)

## 2019-11-29 NOTE — Patient Instructions (Signed)
Good to see you See me again in 4 weeks

## 2019-11-29 NOTE — Progress Notes (Signed)
[place] Phone: [phone] Subjective:   [name], am serving as a scribe for Dr. Hulan Saas. This visit occurred during the SARS-CoV-2 public health emergency.  Safety protocols were in place, including screening questions prior to the visit, additional usage of staff PPE, and extensive cleaning of exam room while observing appropriate contact time as indicated for disinfecting solutions.   I'm seeing this patient by the request  of:  [name], MD (Inactive)  CC: Back pain follow-up  [id number]   11/15/2019 Chronic problem with exacerbation.  Worsening symptoms at this time.  Short course of prednisone given.  Toradol and Depo-Medrol given today to help with symptom relief sooner.  Discussed icing regimen and home exercise, which activities to do which wants to avoid.  Increase activity slowly.  Follow-up again in 2-3 weeks.  Update 11/29/2019 Gloria [name] is a 74 y.o. female coming in with complaint of back pain. Patient states that she is doing much better.  Patient at last exam was having more of a flare noted.  Patient states that she feels like she is doing significantly better at this time.  Patient has been able to be a little bit more active recently and that seems to be better as well.  Feels the prednisone was beneficial.       Past Medical History:  Diagnosis Date  . Anxiety   . Arthritis   . CKD (chronic kidney disease), stage III   . Complication of anesthesia 05/2014   had body shaking-observed over night-see anesthesia note-has had tremors post op in past  . Depression   . Hyperlipemia   . Hypertension   . Insomnia    Past Surgical History:  Procedure Laterality Date  . ABDOMINAL HYSTERECTOMY     age 42  . BACK SURGERY    . CARPAL TUNNEL RELEASE  01/20/2012   Procedure: CARPAL TUNNEL RELEASE;  Surgeon: Wynonia Sours, MD;  Location: [facility];  Service:  Orthopedics;  Laterality: Right;  . CARPAL TUNNEL RELEASE  02/25/2012   Procedure: CARPAL TUNNEL RELEASE;  Surgeon: Wynonia Sours, MD;  Location: [facility];  Service: Orthopedics;  Laterality: Left;  RELEASE A-1 PULLEY LRF  . COLONOSCOPY WITH PROPOFOL N/A 10/15/2016   Procedure: COLONOSCOPY WITH PROPOFOL;  Surgeon: Wilford Corner, MD;  Location: [facility];  Service: Endoscopy;  Laterality: N/A;  . DISTAL BICEPS TENDON REPAIR Left 06/12/2015   Procedure: REPAIR LEFT BICEPS TENDON AND DECOMPRESSION OF MEDIAN NERVE;  Surgeon: Daryll Brod, MD;  Location: [facility];  Service: Orthopedics;  Laterality: Left;  . SMALL INTESTINE SURGERY    . SMALL INTESTINE SURGERY     has had 6 total bowel obstructions -mostlt adhesions-last 2009  . TONSILLECTOMY     age 66  . TRIGGER FINGER RELEASE  2/12   rt middle  . TRIGGER FINGER RELEASE  2010   lt middle  . ULNAR NERVE TRANSPOSITION Right 05/11/2014   Procedure: DECOMPRESSION ULNAR NERVE RIGHT ELBOW ;  Surgeon: Daryll Brod, MD;  Location: [facility];  Service: Orthopedics;  Laterality: Right;  . ULNAR NERVE TRANSPOSITION Left 07/06/2014   Procedure: DECOMPRESSION  LEFT ULNAR NERVE;  Surgeon: Daryll Brod, MD;  Location: [facility];  Service: Orthopedics;  Laterality: Left;   Social History   Socioeconomic History  . Marital status: Married    Spouse name: Not on file  .  Number of children: Not on file  . Years of education: Not on file  . Highest education level: Not on file  Occupational History  . Not on file  Tobacco Use  . Smoking status: Never Smoker  . Smokeless tobacco: Never Used  Vaping Use  . Vaping Use: Never used  Substance and Sexual Activity  . Alcohol use: No  . Drug use: No  . Sexual activity: Not on file  Other Topics Concern  . Not on file  Social History Narrative  . Not on file   Social Determinants of Health   Financial Resource Strain:   . Difficulty of Paying  Living Expenses:   Food Insecurity:   . Worried About Charity fundraiser in the Last Year:   . Arboriculturist in the Last Year:   Transportation Needs:   . Film/video editor (Medical):   Marland Kitchen Lack of Transportation (Non-Medical):   Physical Activity:   . Days of Exercise per Week:   . Minutes of Exercise per Session:   Stress:   . Feeling of Stress :   Social Connections:   . Frequency of Communication with Friends and Family:   . Frequency of Social Gatherings with Friends and Family:   . Attends Religious Services:   . Active Member of Clubs or Organizations:   . Attends Archivist Meetings:   Marland Kitchen Marital Status:    Allergies  Allergen Reactions  . Versed [Midazolam] Other (See Comments)  . Diprivan [Propofol] Other (See Comments)    Uncontrolled shaking  . Fentanyl   . Sevoflurane   . Zofran [Ondansetron Hcl]    Family History  Problem Relation Age of Onset  . Heart failure Father     Current Outpatient Medications (Endocrine & Metabolic):  .  alendronate (FOSAMAX) 10 MG tablet, Take 10 mg by mouth daily before breakfast. Take with a full glass of water on an empty stomach. .  predniSONE (DELTASONE) 50 MG tablet, Take one tablet daily for the next 5 days. .  predniSONE (DELTASONE) 50 MG tablet, 1 tablet by mouth daily  Current Outpatient Medications (Cardiovascular):  .  hydrochlorothiazide (MICROZIDE) 12.5 MG capsule, Take 12.5 mg by mouth daily.   Current Outpatient Medications (Analgesics):  .  aspirin 81 MG tablet, Take 81 mg by mouth daily.   Current Outpatient Medications (Other):  Marland Kitchen  Carboxymethylcellulose Sodium (EYE DROPS OP), Place 1 drop into both eyes daily as needed (dry eyes). Marland Kitchen  diclofenac sodium (VOLTAREN) 1 % GEL, Apply 2 g topically 4 (four) times daily. Marland Kitchen  ESTRACE VAGINAL 0.1 MG/GM vaginal cream,  .  gabapentin (NEURONTIN) 100 MG capsule, TAKE 2 CAPSULES BY MOUTH AT BEDTIME .  Misc Natural Products (TART CHERRY ADVANCED PO), Take 1  tablet by mouth daily. .  Multiple Vitamins-Calcium (ONE-A-DAY WOMENS PO), Take 1 tablet by mouth daily. .  potassium chloride (K-DUR,KLOR-CON) 10 MEQ tablet, Take 10 mEq by mouth daily. .  tizanidine (ZANAFLEX) 2 MG capsule, Take 1 capsule (2 mg total) by mouth 3 (three) times daily. .  traZODone (DESYREL) 50 MG tablet, Take 50-150 mg by mouth at bedtime.  .  TURMERIC PO, Take 1 tablet by mouth daily. .  Vitamin D, Ergocalciferol, (DRISDOL) 1.25 MG (50000 UNIT) CAPS capsule, TAKE 1 CAPSULE BY MOUTH ONE TIME PER WEEK   Reviewed prior external information including notes and imaging from  primary care provider As well as notes that were available from care everywhere and other  healthcare systems.  Past medical history, social, surgical and family history all reviewed in electronic medical record.  No pertanent information unless stated regarding to the chief complaint.   Review of Systems:  No headache, visual changes, nausea, vomiting, diarrhea, constipation, dizziness, abdominal pain, skin rash, fevers, chills, night sweats, weight loss, swollen lymph nodes, body aches, joint swelling, chest pain, shortness of breath, mood changes. POSITIVE muscle aches  Objective  Blood pressure (!) 126/92, pulse 82, height 5\' 6"  (1.676 m), weight 133 lb (60.3 kg), SpO2 98 %.   General: No apparent distress alert and oriented x3 mood and affect normal, dressed appropriately.  HEENT: Pupils equal, extraocular movements intact  Respiratory: Patient's speak in full sentences and does not appear short of breath  Cardiovascular: No lower extremity edema, non tender, no erythema  Neuro: Cranial nerves II through XII are intact, neurovascularly intact in all extremities with 2+ DTRs and 2+ pulses.  Gait normal with good balance and coordination.  MSK: Arthritic changes of multiple joints. Patient does have loss of lordosis and degenerative scoliosis noted.  Patient does have some tightness noted with Corky Sox  still from what would be considered her baseline.  Osteopathic findings C4 flexed rotated and side bent left C7 flexed rotated and side bent left T3 extended rotated and side bent right inhaled third rib T9 extended rotated and side bent left L2 flexed rotated and side bent right Sacrum right on right     Impression and Recommendations:     The above documentation has been reviewed and is accurate and complete Lyndal Pulley, DO       Note: This dictation was prepared with Dragon dictation along with smaller phrase technology. Any transcriptional errors that result from this process are unintentional.

## 2019-11-29 NOTE — Assessment & Plan Note (Signed)
Improvement noted.  I do believe the patient was having more of an exacerbation secondary to the rheumatoid arthritis that was likely contributing and responded very well to the prednisone.  Patient is near close to her baseline.  Attempted osteopathic manipulation today.  Increase activity slowly.  Follow-up again in 4 to 8 weeks

## 2019-12-06 ENCOUNTER — Ambulatory Visit: Payer: PPO | Admitting: Family Medicine

## 2019-12-16 DIAGNOSIS — Z0000 Encounter for general adult medical examination without abnormal findings: Secondary | ICD-10-CM | POA: Diagnosis not present

## 2019-12-16 DIAGNOSIS — E559 Vitamin D deficiency, unspecified: Secondary | ICD-10-CM | POA: Diagnosis not present

## 2019-12-16 DIAGNOSIS — I10 Essential (primary) hypertension: Secondary | ICD-10-CM | POA: Diagnosis not present

## 2019-12-16 DIAGNOSIS — E7800 Pure hypercholesterolemia, unspecified: Secondary | ICD-10-CM | POA: Diagnosis not present

## 2019-12-16 DIAGNOSIS — M069 Rheumatoid arthritis, unspecified: Secondary | ICD-10-CM | POA: Diagnosis not present

## 2019-12-16 DIAGNOSIS — M810 Age-related osteoporosis without current pathological fracture: Secondary | ICD-10-CM | POA: Diagnosis not present

## 2019-12-27 ENCOUNTER — Ambulatory Visit: Payer: PPO | Admitting: Family Medicine

## 2019-12-27 ENCOUNTER — Encounter: Payer: Self-pay | Admitting: Family Medicine

## 2019-12-27 ENCOUNTER — Other Ambulatory Visit: Payer: Self-pay

## 2019-12-27 VITALS — BP 146/90 | HR 78 | Ht 66.0 in | Wt 130.0 lb

## 2019-12-27 DIAGNOSIS — M48061 Spinal stenosis, lumbar region without neurogenic claudication: Secondary | ICD-10-CM | POA: Diagnosis not present

## 2019-12-27 DIAGNOSIS — M069 Rheumatoid arthritis, unspecified: Secondary | ICD-10-CM | POA: Diagnosis not present

## 2019-12-27 DIAGNOSIS — M999 Biomechanical lesion, unspecified: Secondary | ICD-10-CM | POA: Diagnosis not present

## 2019-12-27 NOTE — Patient Instructions (Signed)
See me again in 6 weeks 

## 2019-12-27 NOTE — Assessment & Plan Note (Signed)
Degenerative disc disease with spinal stenosis.  Patient seems to be stable at this point.  Discussed home exercise, core strengthening, what activities to potentially avoid such as scraping her kitchen floors follow-up again 4 to 8 weeks

## 2019-12-27 NOTE — Progress Notes (Signed)
[facility] [address] [place] Phone: [phone] Subjective:   I [name] am serving as a Education administrator for Dr. Hulan Saas.  This visit occurred during the SARS-CoV-2 public health emergency.  Safety protocols were in place, including screening questions prior to the visit, additional usage of staff PPE, and extensive cleaning of exam room while observing appropriate contact time as indicated for disinfecting solutions.   I'm seeing this patient by the request  of:  [name], MD (Inactive)  CC: Low back pain follow-up  [id number]  Gloria [name] is a 74 y.o. female coming in with complaint of back and neck pain. OMT 11/29/2019. Patient states she has been scrapping wax off her kitchen floor. Back is TTP but not bad.  Nothing like the flare she had previously  Medications patient has been prescribed: Zanaflex, gabapentin  Taking: Yes when needed         Reviewed prior external information including notes and imaging from previsou exam, outside providers and external EMR if available.   As well as notes that were available from care everywhere and other healthcare systems.  Past medical history, social, surgical and family history all reviewed in electronic medical record.  No pertanent information unless stated regarding to the chief complaint.   Past Medical History:  Diagnosis Date  . Anxiety   . Arthritis   . CKD (chronic kidney disease), stage III   . Complication of anesthesia 05/2014   had body shaking-observed over night-see anesthesia note-has had tremors post op in past  . Depression   . Hyperlipemia   . Hypertension   . Insomnia     Allergies  Allergen Reactions  . Versed [Midazolam] Other (See Comments)  . Diprivan [Propofol] Other (See Comments)    Uncontrolled shaking  . Fentanyl   . Sevoflurane   . Zofran [Ondansetron Hcl]      Review of Systems:  No headache, visual changes, nausea,  vomiting, diarrhea, constipation, dizziness, abdominal pain, skin rash, fevers, chills, night sweats, weight loss, swollen lymph nodes, body aches, joint swelling, chest pain, shortness of breath, mood changes. POSITIVE muscle aches  Objective  Blood pressure (!) 146/90, pulse 78, height 5\' 6"  (1.676 m), weight 130 lb (59 kg), SpO2 98 %.   General: No apparent distress alert and oriented x3 mood and affect normal, dressed appropriately.  HEENT: Pupils equal, extraocular movements intact  Respiratory: Patient's speak in full sentences and does not appear short of breath  Cardiovascular: No lower extremity edema, non tender, no erythema  Neuro: Cranial nerves II through XII are intact, neurovascularly intact in all extremities with 2+ DTRs and 2+ pulses.  Gait normal with good balance and coordination.  MSK: Arthritic changes of multiple joints Back -significant scoliosis noted.  Patient does have tightness in the paraspinal musculature lumbar spine (than right.  Patient has mild tightness with FABER test negative straight leg test.  Mild tightness with straight leg more than patient's baseline neurovascular intact distally.  Osteopathic findings  C2 flexed rotated and side bent right T3 extended rotated and side bent right inhaled rib T7 extended rotated and side bent left L2 flexed rotated and side bent right Sacrum right on right       Assessment and Plan:  Rheumatoid arthritis ([place]) Degenerative disc disease with spinal stenosis.  Patient seems to be stable at this point.  Discussed home exercise, core strengthening, what activities to potentially avoid such as scraping her kitchen floors  follow-up again 4 to 8 weeks    Nonallopathic problems  Decision today to treat with OMT was based on Physical Exam  After verbal consent patient was treated with HVLA, ME, FPR techniques in cervical, rib, thoracic, lumbar, and sacral  areas  Patient tolerated the procedure well with  improvement in symptoms  Patient given exercises, stretches and lifestyle modifications  See medications in patient instructions if given  Patient will follow up in 4-8 weeks      The above documentation has been reviewed and is accurate and complete Lyndal Pulley, DO       Note: This dictation was prepared with Dragon dictation along with smaller phrase technology. Any transcriptional errors that result from this process are unintentional.

## 2020-02-07 ENCOUNTER — Ambulatory Visit: Payer: PPO | Admitting: Family Medicine

## 2020-02-07 ENCOUNTER — Other Ambulatory Visit: Payer: Self-pay

## 2020-02-07 ENCOUNTER — Encounter: Payer: Self-pay | Admitting: Family Medicine

## 2020-02-07 VITALS — BP 140/72 | HR 82 | Ht 66.0 in | Wt 133.0 lb

## 2020-02-07 DIAGNOSIS — M48061 Spinal stenosis, lumbar region without neurogenic claudication: Secondary | ICD-10-CM

## 2020-02-07 DIAGNOSIS — M999 Biomechanical lesion, unspecified: Secondary | ICD-10-CM | POA: Diagnosis not present

## 2020-02-07 NOTE — Assessment & Plan Note (Signed)
Known significant spinal stenosis.  Chronic problem with mild exacerbation, discussed the gabapentin, discussed the core strengthening.  Responds well to manipulation.  Continue to stay active.  Follow-up again in 6 to 8 weeks

## 2020-02-07 NOTE — Patient Instructions (Signed)
Stretch after activity See me again in 6 weeks

## 2020-02-07 NOTE — Progress Notes (Signed)
[place] Phone: [phone] Subjective:   Gloria [name], am serving as a scribe for Dr. Hulan Saas. This visit occurred during the SARS-CoV-2 public health emergency.  Safety protocols were in place, including screening questions prior to the visit, additional usage of staff PPE, and extensive cleaning of exam room while observing appropriate contact time as indicated for disinfecting solutions.   I'm seeing this patient by the request  of:  [name], MD (Inactive)  CC: Low back pain follow-up  [id number]  Gloria [name] is a 74 y.o. female coming in with complaint of back and neck pain. OMT 12/27/2019. Patient states her back did start bothering her on Saturday after doing a lot of shopping. Has had some ankle swelling twice last week. Is wondering what would cause this.  Patient has been a lot more active recently.  Doing a lot of yard work with family in town.  Has been up on a ladder cutting down different trees on a regular basis.  Denies any recent flares of the rheumatoid arthritis.  Medications patient has been prescribed: Vit D, Zanaflex, gabapentin           Reviewed prior external information including notes and imaging from previsou exam, outside providers and external EMR if available.   As well as notes that were available from care everywhere and other healthcare systems.  Past medical history, social, surgical and family history all reviewed in electronic medical record.  [name] unless stated regarding to the chief complaint.   Past Medical History:  Diagnosis Date  . Anxiety   . Arthritis   . CKD (chronic kidney disease), stage III ([place])   . Complication of anesthesia 05/2014   had body shaking-observed over night-see anesthesia note-has had tremors post op in past  . Depression   . Hyperlipemia   . Hypertension   . Insomnia     Allergies  Allergen  Reactions  . Versed [Midazolam] Other (See Comments)  . Diprivan [Propofol] Other (See Comments)    Uncontrolled shaking  . Fentanyl   . Sevoflurane   . Zofran [Ondansetron Hcl]      Review of Systems:  [name] headache, visual changes, nausea, vomiting, diarrhea, constipation, dizziness, abdominal pain, skin rash, fevers, chills, night sweats, weight loss, swollen lymph nodes, body aches, joint swelling, chest pain, shortness of breath, mood changes. POSITIVE muscle aches  Objective  Blood pressure 140/72, pulse 82, height 5\' 6"  (1.676 m), weight 133 lb (60.3 kg), SpO2 97 %.   General: [name] apparent distress alert and oriented x3 mood and affect normal, dressed appropriately.  HEENT: Pupils equal, extraocular movements intact  Respiratory: Patient's speak in full sentences and does not appear short of breath  Cardiovascular: [name] lower extremity edema, non tender, [name] erythema  Neuro: Cranial nerves II through XII are intact, neurovascularly intact in all extremities with 2+ DTRs and 2+ pulses.  Gait normal with good balance and coordination.  MSK: Arthritic changes of multiple joints [name] swelling of any of the joints at the moment Low back does show significant scoliosis noted.  Patient does have tightness in the paraspinal musculature.  Patient does have some mild tilt of the pelvis more than usual right greater than left with an anterior ilium noted.  Neurovascularly intact distally.  Osteopathic findings  C6 flexed rotated and side bent right T4 extended rotated and side bent right inhaled rib T8 extended rotated and side bent  left L2 flexed rotated and side bent right Sacrum right on right       Assessment and Plan: Degenerative lumbar spinal stenosis Known significant spinal stenosis.  Chronic problem with mild exacerbation, discussed the gabapentin, discussed the core strengthening.  Responds well to manipulation.  Continue to stay active.  Follow-up again in 6 to 8 weeks      Nonallopathic problems  Decision today to treat with OMT was based on Physical Exam  After verbal consent patient was treated with HVLA, ME, FPR techniques in cervical, rib, thoracic, lumbar, and sacral  areas, avoided HVLA on the neck  Patient tolerated the procedure well with improvement in symptoms  Patient given exercises, stretches and lifestyle modifications  See medications in patient instructions if given  Patient will follow up in 6-8 weeks      The above documentation has been reviewed and is accurate and complete Lyndal Pulley, DO       Note: This dictation was prepared with Dragon dictation along with smaller phrase technology. Any transcriptional errors that result from this process are unintentional.

## 2020-02-14 DIAGNOSIS — Z01419 Encounter for gynecological examination (general) (routine) without abnormal findings: Secondary | ICD-10-CM | POA: Diagnosis not present

## 2020-02-14 DIAGNOSIS — Z6820 Body mass index (BMI) 20.0-20.9, adult: Secondary | ICD-10-CM | POA: Diagnosis not present

## 2020-02-14 DIAGNOSIS — M810 Age-related osteoporosis without current pathological fracture: Secondary | ICD-10-CM | POA: Diagnosis not present

## 2020-02-14 DIAGNOSIS — Z1231 Encounter for screening mammogram for malignant neoplasm of breast: Secondary | ICD-10-CM | POA: Diagnosis not present

## 2020-02-26 ENCOUNTER — Encounter (HOSPITAL_COMMUNITY): Payer: Self-pay

## 2020-02-26 ENCOUNTER — Emergency Department (HOSPITAL_COMMUNITY): Payer: PPO

## 2020-02-26 ENCOUNTER — Emergency Department (HOSPITAL_COMMUNITY)
Admission: EM | Admit: 2020-02-26 | Discharge: 2020-02-26 | Disposition: A | Discharge status: Home / Self Care | Payer: PPO | Attending: Emergency Medicine | Admitting: Emergency Medicine

## 2020-02-26 ENCOUNTER — Other Ambulatory Visit: Payer: Self-pay

## 2020-02-26 DIAGNOSIS — Z23 Encounter for immunization: Secondary | ICD-10-CM | POA: Insufficient documentation

## 2020-02-26 DIAGNOSIS — I129 Hypertensive chronic kidney disease with stage 1 through stage 4 chronic kidney disease, or unspecified chronic kidney disease: Secondary | ICD-10-CM | POA: Diagnosis not present

## 2020-02-26 DIAGNOSIS — Z7982 Long term (current) use of aspirin: Secondary | ICD-10-CM | POA: Insufficient documentation

## 2020-02-26 DIAGNOSIS — I10 Essential (primary) hypertension: Secondary | ICD-10-CM | POA: Diagnosis not present

## 2020-02-26 DIAGNOSIS — R059 Cough, unspecified: Secondary | ICD-10-CM | POA: Diagnosis not present

## 2020-02-26 DIAGNOSIS — R0609 Other forms of dyspnea: Secondary | ICD-10-CM | POA: Diagnosis not present

## 2020-02-26 DIAGNOSIS — Z79899 Other long term (current) drug therapy: Secondary | ICD-10-CM | POA: Insufficient documentation

## 2020-02-26 DIAGNOSIS — U071 COVID-19: Secondary | ICD-10-CM | POA: Diagnosis not present

## 2020-02-26 DIAGNOSIS — N183 Chronic kidney disease, stage 3 unspecified: Secondary | ICD-10-CM | POA: Insufficient documentation

## 2020-02-26 DIAGNOSIS — R5383 Other fatigue: Secondary | ICD-10-CM | POA: Diagnosis present

## 2020-02-26 DIAGNOSIS — E876 Hypokalemia: Secondary | ICD-10-CM | POA: Diagnosis not present

## 2020-02-26 DIAGNOSIS — R42 Dizziness and giddiness: Secondary | ICD-10-CM | POA: Diagnosis not present

## 2020-02-26 DIAGNOSIS — R0600 Dyspnea, unspecified: Secondary | ICD-10-CM | POA: Diagnosis not present

## 2020-02-26 LAB — BASIC METABOLIC PANEL
Anion gap: 12 (ref 5–15)
BUN: 17 mg/dL (ref 8–23)
CO2: 29 mmol/L (ref 22–32)
Calcium: 8 mg/dL — ABNORMAL LOW (ref 8.9–10.3)
Chloride: 95 mmol/L — ABNORMAL LOW (ref 98–111)
Creatinine, Ser: 1.05 mg/dL — ABNORMAL HIGH (ref 0.44–1.00)
GFR, Estimated: 56 mL/min — ABNORMAL LOW (ref >60)
Glucose, Bld: 139 mg/dL — ABNORMAL HIGH (ref 70–99)
Potassium: 3.1 mmol/L — ABNORMAL LOW (ref 3.5–5.1)
Sodium: 136 mmol/L (ref 135–145)

## 2020-02-26 LAB — URINALYSIS, ROUTINE W REFLEX MICROSCOPIC
Bilirubin Urine: NEGATIVE
Glucose, UA: NEGATIVE mg/dL
Hgb urine dipstick: NEGATIVE
Ketones, ur: NEGATIVE mg/dL
Leukocytes,Ua: NEGATIVE
Nitrite: NEGATIVE
Protein, ur: 100 mg/dL — AB
Specific Gravity, Urine: 1.017 (ref 1.005–1.030)
pH: 7 (ref 5.0–8.0)

## 2020-02-26 LAB — COMPREHENSIVE METABOLIC PANEL
ALT: 27 U/L (ref 0–44)
AST: 36 U/L (ref 15–41)
Albumin: 4.6 g/dL (ref 3.5–5.0)
Alkaline Phosphatase: 61 U/L (ref 38–126)
Anion gap: 15 (ref 5–15)
BUN: 23 mg/dL (ref 8–23)
CO2: 33 mmol/L — ABNORMAL HIGH (ref 22–32)
Calcium: 8.8 mg/dL — ABNORMAL LOW (ref 8.9–10.3)
Chloride: 88 mmol/L — ABNORMAL LOW (ref 98–111)
Creatinine, Ser: 1.29 mg/dL — ABNORMAL HIGH (ref 0.44–1.00)
GFR, Estimated: 44 mL/min — ABNORMAL LOW (ref >60)
Glucose, Bld: 106 mg/dL — ABNORMAL HIGH (ref 70–99)
Potassium: 2.3 mmol/L — CL (ref 3.5–5.1)
Sodium: 136 mmol/L (ref 135–145)
Total Bilirubin: 0.8 mg/dL (ref 0.3–1.2)
Total Protein: 7.3 g/dL (ref 6.5–8.1)

## 2020-02-26 LAB — CBC WITH DIFFERENTIAL/PLATELET
Abs Immature Granulocytes: 0.01 10*3/uL (ref 0.00–0.07)
Basophils Absolute: 0 10*3/uL (ref 0.0–0.1)
Basophils Relative: 0 %
Eosinophils Absolute: 0 10*3/uL (ref 0.0–0.5)
Eosinophils Relative: 0 %
HCT: 43.9 % (ref 36.0–46.0)
Hemoglobin: 15.3 g/dL — ABNORMAL HIGH (ref 12.0–15.0)
Immature Granulocytes: 0 %
Lymphocytes Relative: 17 %
Lymphs Abs: 0.4 10*3/uL — ABNORMAL LOW (ref 0.7–4.0)
MCH: 29.5 pg (ref 26.0–34.0)
MCHC: 34.9 g/dL (ref 30.0–36.0)
MCV: 84.7 fL (ref 80.0–100.0)
Monocytes Absolute: 0.3 10*3/uL (ref 0.1–1.0)
Monocytes Relative: 14 %
Neutro Abs: 1.6 10*3/uL — ABNORMAL LOW (ref 1.7–7.7)
Neutrophils Relative %: 69 %
Platelets: 119 10*3/uL — ABNORMAL LOW (ref 150–400)
RBC: 5.18 MIL/uL — ABNORMAL HIGH (ref 3.87–5.11)
RDW: 12.3 % (ref 11.5–15.5)
WBC: 2.4 10*3/uL — ABNORMAL LOW (ref 4.0–10.5)
nRBC: 0 % (ref 0.0–0.2)

## 2020-02-26 LAB — RESPIRATORY PANEL BY RT PCR (FLU A&B, COVID)
Influenza A by PCR: NEGATIVE
Influenza B by PCR: NEGATIVE
SARS Coronavirus 2 by RT PCR: POSITIVE — AB

## 2020-02-26 LAB — MAGNESIUM: Magnesium: 2.5 mg/dL — ABNORMAL HIGH (ref 1.7–2.4)

## 2020-02-26 MED ORDER — METHYLPREDNISOLONE SODIUM SUCC 125 MG IJ SOLR: 125.0000 mg | Freq: Once | INTRAMUSCULAR | Status: DC | PRN

## 2020-02-26 MED ORDER — ALBUTEROL SULFATE HFA 108 (90 BASE) MCG/ACT IN AERS: 2.0000 | INHALATION_SPRAY | Freq: Once | RESPIRATORY_TRACT | Status: DC | PRN

## 2020-02-26 MED ORDER — SODIUM CHLORIDE 0.9 % IV SOLN
Freq: Once | INTRAVENOUS | Status: DC
  Filled 2020-02-26: qty 5

## 2020-02-26 MED ORDER — DIPHENHYDRAMINE HCL 50 MG/ML IJ SOLN: 50.0000 mg | Freq: Once | INTRAMUSCULAR | Status: DC | PRN

## 2020-02-26 MED ORDER — LACTATED RINGERS IV BOLUS
1000.0000 mL | Freq: Once | INTRAVENOUS | Status: AC
  Administered 2020-02-26: 1000 mL via INTRAVENOUS

## 2020-02-26 MED ORDER — POTASSIUM CHLORIDE 10 MEQ/100ML IV SOLN
10.0000 meq | INTRAVENOUS | Status: AC
  Administered 2020-02-26 (×3): 10 meq via INTRAVENOUS
  Filled 2020-02-26 (×3): qty 100

## 2020-02-26 MED ORDER — SODIUM CHLORIDE 0.9 % IV SOLN
Freq: Once | INTRAVENOUS | Status: AC
  Filled 2020-02-26: qty 20

## 2020-02-26 MED ORDER — FAMOTIDINE IN NACL 20-0.9 MG/50ML-% IV SOLN: 20.0000 mg | Freq: Once | INTRAVENOUS | Status: DC | PRN

## 2020-02-26 MED ORDER — MAGNESIUM SULFATE 2 GM/50ML IV SOLN
2.0000 g | Freq: Once | INTRAVENOUS | Status: AC
  Administered 2020-02-26: 2 g via INTRAVENOUS
  Filled 2020-02-26: qty 50

## 2020-02-26 MED ORDER — EPINEPHRINE 0.3 MG/0.3ML IJ SOAJ: 0.3000 mg | Freq: Once | INTRAMUSCULAR | Status: DC | PRN

## 2020-02-26 MED ORDER — POTASSIUM CHLORIDE CRYS ER 20 MEQ PO TBCR
40.0000 meq | EXTENDED_RELEASE_TABLET | Freq: Once | ORAL | Status: AC
  Administered 2020-02-26: 40 meq via ORAL
  Filled 2020-02-26: qty 2

## 2020-02-26 MED ORDER — SODIUM CHLORIDE 0.9 % IV SOLN: INTRAVENOUS | Status: DC | PRN

## 2020-02-26 MED ORDER — POTASSIUM CHLORIDE ER 10 MEQ PO TBCR: 10.0000 meq | EXTENDED_RELEASE_TABLET | Freq: Every day | ORAL | 0 refills | Status: DC

## 2020-02-26 NOTE — ED Provider Notes (Signed)
[facility] DEPT Provider Note   CSN: [id number] Arrival date & time: 02/26/20  1257     History Chief Complaint  Patient presents with  . Fatigue    Gloria [name] is a 74 y.o. female.  74 yo female with history of CKD, hyperlipidemia, hypertension, rheumatoid arthritis presents with complaint or cough, fatigue, and nausea. Denies chest pain, SHOB, fevers. Patient states symptoms started 8 days ago after returning home from a family wedding in [place]. Patient's sister traveled with her and has tested positive for COVID. Patient is not vaccinated due to concerns for the new vaccine. Patient was tested for COVID at urgent care today but sent to the ER for a chest x-ray with concern for pneumonia. COVID test has not resulted.   Gloria [name] was evaluated in Emergency Department on 02/26/2020 for the symptoms described in the history of present illness. She was evaluated in the context of the global COVID-19 pandemic, which necessitated consideration that the patient might be at risk for infection with the SARS-CoV-2 virus that causes COVID-19. Institutional protocols and algorithms that pertain to the evaluation of patients at risk for COVID-19 are in a state of rapid change based on information released by regulatory bodies including the CDC and federal and state organizations. These policies and algorithms were followed during the patient's care in the ED.         Past Medical History:  Diagnosis Date  . Anxiety   . Arthritis   . CKD (chronic kidney disease), stage III ([place])   . Complication of anesthesia 05/2014   had body shaking-observed over night-see anesthesia note-has had tremors post op in past  . Depression   . Hyperlipemia   . Hypertension   . Insomnia     Patient Active Problem List   Diagnosis Date Noted  . Nonallopathic lesion of cervical region 01/05/2018  . Nonallopathic lesion of rib cage 04/30/2017  . Nonallopathic  lesion of thoracic region 04/30/2017  . Nonallopathic lesion of lumbosacral region 04/30/2017  . Nonallopathic lesion of sacral region 04/30/2017  . Degenerative lumbar spinal stenosis 03/10/2017  . Personal history of colonic polyps 10/15/2016  . Cervical spondylosis 10/13/2016  . Depression 10/13/2016  . High blood pressure 10/13/2016  . Nervousness 10/13/2016  . Rheumatoid arthritis ([place]) 10/13/2016  . Sleep disorder 10/13/2016  . Complete tear of right rotator cuff 05/16/2016  . Status post right rotator cuff repair 03/04/2016  . Lesion of left median nerve at forearm 07/16/2015  . Nontraumatic rupture of left bicep tendon 05/09/2015  . Trigger index finger of right hand 05/09/2015  . Other synovitis and tenosynovitis, left forearm 04/25/2015  . Benign neoplasm of soft tissues of left upper extremity 04/09/2015  . Neoplasm of unspecified behavior of bone, soft tissue, and skin 04/09/2015  . Right lateral epicondylitis 04/09/2015  . Ulnar nerve compression 05/11/2014  . Pseudophakia 06/23/2012  . After-cataract 05/30/2011  . Bilateral dry eyes 05/30/2011    Past Surgical History:  Procedure Laterality Date  . ABDOMINAL HYSTERECTOMY     age 38  . BACK SURGERY    . CARPAL TUNNEL RELEASE  01/20/2012   Procedure: CARPAL TUNNEL RELEASE;  Surgeon: Wynonia Sours, MD;  Location: [facility];  Service: Orthopedics;  Laterality: Right;  . CARPAL TUNNEL RELEASE  02/25/2012   Procedure: CARPAL TUNNEL RELEASE;  Surgeon: Wynonia Sours, MD;  Location: [facility];  Service: Orthopedics;  Laterality: Left;  RELEASE A-1  PULLEY LRF  . COLONOSCOPY WITH PROPOFOL N/A 10/15/2016   Procedure: COLONOSCOPY WITH PROPOFOL;  Surgeon: Wilford Corner, MD;  Location: [facility];  Service: Endoscopy;  Laterality: N/A;  . DISTAL BICEPS TENDON REPAIR Left 06/12/2015   Procedure: REPAIR LEFT BICEPS TENDON AND DECOMPRESSION OF MEDIAN NERVE;  Surgeon: Daryll Brod, MD;  Location: [facility];  Service: Orthopedics;  Laterality: Left;  . SMALL INTESTINE SURGERY    . SMALL INTESTINE SURGERY     has had 6 total bowel obstructions -mostlt adhesions-last 2009  . TONSILLECTOMY     age 20  . TRIGGER FINGER RELEASE  2/12   rt middle  . TRIGGER FINGER RELEASE  2010   lt middle  . ULNAR NERVE TRANSPOSITION Right 05/11/2014   Procedure: DECOMPRESSION ULNAR NERVE RIGHT ELBOW ;  Surgeon: Daryll Brod, MD;  Location: [facility];  Service: Orthopedics;  Laterality: Right;  . ULNAR NERVE TRANSPOSITION Left 07/06/2014   Procedure: DECOMPRESSION  LEFT ULNAR NERVE;  Surgeon: Daryll Brod, MD;  Location: [facility];  Service: Orthopedics;  Laterality: Left;     OB History   No obstetric history on file.     Family History  Problem Relation Age of Onset  . Heart failure Father     Social History   Tobacco Use  . Smoking status: Never Smoker  . Smokeless tobacco: Never Used  Vaping Use  . Vaping Use: Never used  Substance Use Topics  . Alcohol use: No  . Drug use: No    Home Medications Prior to Admission medications   Medication Sig Start Date End Date Taking? Authorizing Provider  alendronate (FOSAMAX) 10 MG tablet Take 10 mg by mouth daily before breakfast. Take with a full glass of water on an empty stomach.   Yes [provider]  amLODipine (NORVASC) 5 MG tablet Take 5 mg by mouth daily. 02/09/20  Yes [provider]  aspirin EC 81 MG tablet Take 81 mg by mouth daily. Swallow whole.   Yes [provider]  diclofenac sodium (VOLTAREN) 1 % GEL Apply 2 g topically 4 (four) times daily. Patient taking differently: Apply 2 g topically 2 (two) times daily as needed (pain).  11/04/18  Yes Lyndal Pulley, DO  estradiol (ESTRACE) 0.1 MG/GM vaginal cream Place 1 Applicatorful vaginally 2 (two) times a week.   Yes [provider]  gabapentin (NEURONTIN) 100 MG capsule TAKE 2 CAPSULES BY MOUTH AT  BEDTIME Patient taking differently: Take 100-200 mg by mouth at bedtime.  02/11/19  Yes Hulan Saas M, DO  hydrochlorothiazide (HYDRODIURIL) 25 MG tablet Take 25 mg by mouth daily. 11/14/19  Yes [provider]  hydroxychloroquine (PLAQUENIL) 200 MG tablet Take 400 mg by mouth daily.    Yes [provider]  Multiple Vitamin (MULTIVITAMIN WITH MINERALS) TABS tablet Take 1 tablet by mouth daily.   Yes [provider]  Multiple Vitamins-Minerals (PRESERVISION AREDS 2) CAPS Take 1 capsule by mouth 2 (two) times daily.   Yes [provider]  potassium chloride (K-DUR,KLOR-CON) 10 MEQ tablet Take 10 mEq by mouth daily.   Yes [provider]  tiZANidine (ZANAFLEX) 4 MG tablet Take 8 mg by mouth at bedtime. 01/06/20  Yes [provider]  traZODone (DESYREL) 50 MG tablet Take 50-150 mg by mouth at bedtime.    Yes [provider]  TURMERIC PO Take 1 capsule by mouth daily.    Yes [provider]  potassium chloride (  KLOR-CON) 10 MEQ tablet Take 1 tablet (10 mEq total) by mouth daily for 7 days. 02/26/20 03/04/20  Tacy Learn, PA-C  Vitamin D, Ergocalciferol, (DRISDOL) 1.25 MG (50000 UNIT) CAPS capsule TAKE 1 CAPSULE BY MOUTH ONE TIME PER WEEK Patient not taking: Reported on 02/26/2020 09/06/19   Lyndal Pulley, DO    Allergies    Versed [midazolam], Diprivan [propofol], Fentanyl, Sevoflurane, and Zofran [ondansetron hcl]  Review of Systems   Review of Systems  Constitutional: Positive for fatigue. Negative for chills, diaphoresis and fever.  Respiratory: Positive for cough. Negative for shortness of breath.   Cardiovascular: Negative for chest pain.  Gastrointestinal: Positive for nausea. Negative for abdominal pain, diarrhea and vomiting.  Genitourinary: Negative for dysuria.  Musculoskeletal: Negative for arthralgias and myalgias.  Skin: Negative for rash.  Allergic/Immunologic: Positive for immunocompromised state.   Neurological: Positive for weakness.  All other systems reviewed and are negative.   Physical Exam Updated Vital Signs BP 130/68   Pulse 70   Temp 99.8 F (37.7 C) (Oral)   Resp 20   Ht 5' 6.5" (1.689 m)   Wt 60.3 kg   SpO2 97%   BMI 21.15 kg/m   Physical Exam Vitals and nursing note reviewed.  Constitutional:      General: She is not in acute distress.    Appearance: She is well-developed. She is not diaphoretic.     Comments: Non toxic but appears to feel unwell, generally weak  HENT:     Head: Normocephalic and atraumatic.  Eyes:     Conjunctiva/sclera: Conjunctivae normal.  Cardiovascular:     Rate and Rhythm: Normal rate and regular rhythm.     Heart sounds: Normal heart sounds.  Pulmonary:     Effort: Pulmonary effort is normal.     Breath sounds: Normal breath sounds. No wheezing or rales.  Abdominal:     Palpations: Abdomen is soft.     Tenderness: There is no abdominal tenderness.  Musculoskeletal:     Cervical back: Neck supple.     Right lower leg: No edema.     Left lower leg: No edema.  Skin:    General: Skin is warm and dry.     Findings: No erythema or rash.  Neurological:     Mental Status: She is alert and oriented to person, place, and time.     Comments: No unilateral weakness  Psychiatric:        Behavior: Behavior normal.     ED Results / Procedures / Treatments   Labs (all labs ordered are listed, but only abnormal results are displayed) Labs Reviewed  RESPIRATORY PANEL BY RT PCR (FLU A&B, COVID) - Abnormal; Notable for the following components:      Result Value   SARS Coronavirus 2 by RT PCR POSITIVE (*)    All other components within normal limits  COMPREHENSIVE METABOLIC PANEL - Abnormal; Notable for the following components:   Potassium 2.3 (*)    Chloride 88 (*)    CO2 33 (*)    Glucose, Bld 106 (*)    Creatinine, Ser 1.29 (*)    Calcium 8.8 (*)    GFR, Estimated 44 (*)    All other components within normal limits  CBC  WITH DIFFERENTIAL/PLATELET - Abnormal; Notable for the following components:   WBC 2.4 (*)    RBC 5.18 (*)    Hemoglobin 15.3 (*)    Platelets 119 (*)    Neutro Abs 1.6 (*)  Lymphs Abs 0.4 (*)    All other components within normal limits  URINALYSIS, ROUTINE W REFLEX MICROSCOPIC - Abnormal; Notable for the following components:   APPearance HAZY (*)    Protein, ur 100 (*)    Bacteria, UA FEW (*)    All other components within normal limits  MAGNESIUM - Abnormal; Notable for the following components:   Magnesium 2.5 (*)    All other components within normal limits  BASIC METABOLIC PANEL - Abnormal; Notable for the following components:   Potassium 3.1 (*)    Chloride 95 (*)    Glucose, Bld 139 (*)    Creatinine, Ser 1.05 (*)    Calcium 8.0 (*)    GFR, Estimated 56 (*)    All other components within normal limits    EKG None  Radiology DG Chest Port 1 View  Result Date: 02/26/2020 CLINICAL DATA:  Cough. EXAM: PORTABLE CHEST 1 VIEW COMPARISON:  01/26/2018 FINDINGS: The cardiomediastinal silhouette is unremarkable. There is no evidence of focal airspace disease, pulmonary edema, suspicious pulmonary nodule/mass, pleural effusion, or pneumothorax. No acute bony abnormalities are identified. IMPRESSION: No active disease. Electronically Signed   By: Margarette Canada M.D.   On: 02/26/2020 14:20    Procedures .Critical Care Performed by: Tacy Learn, PA-C Authorized by: Tacy Learn, PA-C   Critical care provider statement:    Critical care time (minutes):  45   Critical care was time spent personally by me on the following activities:  Discussions with consultants, evaluation of patient's response to treatment, examination of patient, ordering and performing treatments and interventions, ordering and review of laboratory studies, ordering and review of radiographic studies, pulse oximetry, re-evaluation of patient's condition, obtaining history from patient or surrogate and  review of old charts   (including critical care time)  Medications Ordered in ED Medications  0.9 %  sodium chloride infusion (has no administration in time range)  diphenhydrAMINE (BENADRYL) injection 50 mg (has no administration in time range)  famotidine (PEPCID) IVPB 20 mg premix (has no administration in time range)  methylPREDNISolone sodium succinate (SOLU-MEDROL) 125 mg/2 mL injection 125 mg (has no administration in time range)  albuterol (VENTOLIN HFA) 108 (90 Base) MCG/ACT inhaler 2 puff (has no administration in time range)  EPINEPHrine (EPI-PEN) injection 0.3 mg (has no administration in time range)  potassium chloride SA (KLOR-CON) CR tablet 40 mEq (40 mEq Oral Given 02/26/20 1612)  potassium chloride 10 mEq in 100 mL IVPB (0 mEq Intravenous Stopped 02/26/20 2019)  magnesium sulfate IVPB 2 g 50 mL (0 g Intravenous Stopped 02/26/20 1832)  lactated ringers bolus 1,000 mL (0 mLs Intravenous Stopped 02/26/20 1854)  bamlanivimab 700 mg, etesevimab 1,400 mg in sodium chloride 0.9 % 160 mL IVPB ( Intravenous Stopped 02/26/20 1915)    ED Course  I have reviewed the triage vital signs and the nursing notes.  Pertinent labs & imaging results that were available during my care of the patient were reviewed by me and considered in my medical decision making (see chart for details).  Clinical Course as of Feb 25 2229  Sun Feb 26, 7763  1855 74 year old female with cough, fatigue, headache x 8 days. Patient appears to feel unwell although not toxic. Lungs clear, no unilateral weakness. Vitals reviewed O2 sat greater than 90% on room air. Patient's sister is COVID +, patient has been exposed to her and has not been vaccinated.  CXR unremarkable. Labs reviewed. Mild leukopenia with WBC of  2.4 and platelets low at 119, consistent with suspected COVID infection. CMP with hypokalemia with K of 2.3. Patient was given oral as well as IV K. EKG reviewed with Dr. Rex Kras, t waves flat, no  significant change from prior EKG.  COVID positive. Plan reviewed with Dr. Rex Kras, ER attending- K treated with PO and IV K, will hold for infusion in the ER with recheck BMP. Given MAB infusion.  Plan is for discharge with close follow up.    [LM]  2014 Patient continues to do well. Persistent cough. Given orange juice as requested.    [LM]  2229 K has improved to 3.3, patient feeling better. Plan is to discharge home with short course of daily potassium. Recheck with PCP or COVID clinic this week. Return to ER for worsening or concerning symptoms.    [LM]    Clinical Course User Index [LM] Roque Lias   MDM Rules/Calculators/A&P                          Final Clinical Impression(s) / ED Diagnoses Final diagnoses:  COVID-19  Hypokalemia    Rx / DC Orders ED Discharge Orders         Ordered    potassium chloride (KLOR-CON) 10 MEQ tablet  Daily        02/26/20 2228           Tacy Learn, PA-C 02/26/20 2230    Little, Wenda Overland, MD 02/29/20 [phone]

## 2020-02-26 NOTE — ED Notes (Signed)
ED Provider at bedside. 

## 2020-02-26 NOTE — ED Notes (Signed)
Pt's niece is primary contact since sister is admitted w/covid. Andris Flurry [phone]

## 2020-02-26 NOTE — ED Notes (Signed)
Date and time results received: 02/26/20 3:26 PM  Test: K Critical Value: 2.3  Name of Provider Notified: Percell Miller  Orders Received? Or Actions Taken?: Informed provider

## 2020-02-26 NOTE — ED Triage Notes (Signed)
Pt arrived via walk in, c/o fatigue x1 week . States she went to urgent care and was told to go to ED to rule out PNA. Pt sister just tested (COVID +)

## 2020-02-26 NOTE — Discharge Instructions (Addendum)
Home to quarantine.  Recheck with your doctor or the [facility] clinic, referral given. Return to ER for worsening or concerning symptoms. You should have labs rechecked this week to check your potassium level.

## 2020-03-06 DIAGNOSIS — U071 COVID-19: Secondary | ICD-10-CM | POA: Diagnosis not present

## 2020-03-06 DIAGNOSIS — Z79899 Other long term (current) drug therapy: Secondary | ICD-10-CM | POA: Diagnosis not present

## 2020-03-20 ENCOUNTER — Encounter: Payer: Self-pay | Admitting: Family Medicine

## 2020-03-20 ENCOUNTER — Ambulatory Visit: Payer: PPO | Admitting: Family Medicine

## 2020-03-20 ENCOUNTER — Other Ambulatory Visit: Payer: Self-pay

## 2020-03-20 VITALS — BP 128/78 | HR 86 | Ht 66.5 in | Wt 128.0 lb

## 2020-03-20 DIAGNOSIS — U071 COVID-19: Secondary | ICD-10-CM | POA: Diagnosis not present

## 2020-03-20 DIAGNOSIS — M069 Rheumatoid arthritis, unspecified: Secondary | ICD-10-CM

## 2020-03-20 DIAGNOSIS — M999 Biomechanical lesion, unspecified: Secondary | ICD-10-CM

## 2020-03-20 DIAGNOSIS — M48061 Spinal stenosis, lumbar region without neurogenic claudication: Secondary | ICD-10-CM | POA: Diagnosis not present

## 2020-03-20 DIAGNOSIS — Z79899 Other long term (current) drug therapy: Secondary | ICD-10-CM | POA: Diagnosis not present

## 2020-03-20 NOTE — Assessment & Plan Note (Signed)
History of the rheumatoid arthritis.  Discussed with patient in great length.  Encouraged her to continue the other medications.  Patient has chronic problem with mild exacerbation with but did respond well to the manipulation.  Patient is still recovering from Covid and likely will have some fatigue for some time.  Likely patient was able to get the monoclonal antibodies.  Follow-up with me again in 6 to 7 weeks

## 2020-03-20 NOTE — Progress Notes (Signed)
[facility] [place] [place] Phone: [phone] Subjective:   Gloria [name], am serving as a scribe for Dr. Hulan Saas. This visit occurred during the SARS-CoV-2 public health emergency.  Safety protocols were in place, including screening questions prior to the visit, additional usage of staff PPE, and extensive cleaning of exam room while observing appropriate contact time as indicated for disinfecting solutions.   I'm seeing this patient by the request  of:  Glenis Smoker, MD  CC: Low back pain follow-up  YHC:[id number]  Gloria [name] is a 74 y.o. female coming in with complaint of back and neck pain. OMT 02/07/2020. Patient states that she had COVID one month ago. Patient remains fatigued but is improving otherwise. State that ivermectin helped to improve her symptoms.  Patient also received monoclonal antibodies  Medications patient has been prescribed: Gabapentin, Vit D  Taking: Yes         Reviewed prior external information including notes and imaging from previsou exam, outside providers and external EMR if available.   As well as notes that were available from care everywhere and other healthcare systems.  Past medical history, social, surgical and family history all reviewed in electronic medical record.  [name] unless stated regarding to the chief complaint.   Past Medical History:  Diagnosis Date  . Anxiety   . Arthritis   . CKD (chronic kidney disease), stage III ([place])   . Complication of anesthesia 05/2014   had body shaking-observed over night-see anesthesia note-has had tremors post op in past  . Depression   . Hyperlipemia   . Hypertension   . Insomnia     Allergies  Allergen Reactions  . Versed [Midazolam] Other (See Comments)    Uncontrolled shaking  . Diprivan [Propofol] Other (See Comments)    Uncontrolled shaking  . Fentanyl Other (See Comments)    Uncontrolled  shaking  . Sevoflurane Other (See Comments)    Uncontrolled shaking  . Zofran [Ondansetron Hcl] Other (See Comments)    Uncontrolled shaking     Review of Systems:  [name] headache, visual changes, nausea, vomiting, diarrhea, constipation, dizziness, abdominal pain, skin rash, fevers, chills, night sweats, weight loss, swollen lymph nodes, body aches, joint swelling, chest pain, shortness of breath, mood changes. POSITIVE muscle aches  Objective  Blood pressure 128/78, pulse 86, height 5' 6.5" (1.689 m), weight 128 lb (58.1 kg), SpO2 97 %.   General: [name] apparent distress alert and oriented x3 mood and affect normal, dressed appropriately.  HEENT: Pupils equal, extraocular movements intact  Respiratory: Patient's speak in full sentences and does not appear short of breath  Cardiovascular: [name] lower extremity edema, non tender, [name] erythema  Low back exam does shows degenerative scoliosis.  Patient does have some tightness noted.  A little more than patient's baseline.  Patient does have tightness with FABER test as well.  Mild loss of extension of the back.  Neurovascularly intact distally.  Osteopathic findings  C6 flexed rotated and side bent left T3 extended rotated and side bent right inhaled rib L2 flexed rotated and side bent right Sacrum right on right       Assessment and Plan:  Rheumatoid arthritis ([place]) History of the rheumatoid arthritis.  Discussed with patient in great length.  Encouraged her to continue the other medications.  Patient has chronic problem with mild exacerbation with but did respond well to the manipulation.  Patient is still recovering from Covid  and likely will have some fatigue for some time.  Likely patient was able to get the monoclonal antibodies.  Follow-up with me again in 6 to 7 weeks  Degenerative lumbar spinal stenosis Chronic problem with exacerbation.  Patient was sick with cold and not being as active.  Did respond well to manipulation.   Follow-up again in 4 to 8 weeks  COVID-19 F/u with pcp for labs   still fatigued vitals stable today, [name] sign of SOB, denies Chest pain, on zinc, vit D, vit C and aspirin 81mg .   Nonallopathic problems  Decision today to treat with OMT was based on Physical Exam  After verbal consent patient was treated with  ME, FPR techniques in cervical, rib, thoracic, lumbar, and sacral  areas  Patient tolerated the procedure well with improvement in symptoms  Patient given exercises, stretches and lifestyle modifications  See medications in patient instructions if given  Patient will follow up in 4-8 weeks      The above documentation has been reviewed and is accurate and complete Lyndal Pulley, DO       Note: This dictation was prepared with Dragon dictation along with smaller phrase technology. Any transcriptional errors that result from this process are unintentional.

## 2020-03-20 NOTE — Assessment & Plan Note (Signed)
Chronic problem with exacerbation.  Patient was sick with cold and not being as active.  Did respond well to manipulation.  Follow-up again in 4 to 8 weeks

## 2020-03-20 NOTE — Patient Instructions (Signed)
Glad you are getting better Have a wonderful Thanksgiving Energy will come slowly See me in 6-7 weeks

## 2020-03-20 NOTE — Assessment & Plan Note (Signed)
F/u with pcp for labs

## 2020-03-26 DIAGNOSIS — H353131 Nonexudative age-related macular degeneration, bilateral, early dry stage: Secondary | ICD-10-CM | POA: Diagnosis not present

## 2020-03-26 DIAGNOSIS — H40013 Open angle with borderline findings, low risk, bilateral: Secondary | ICD-10-CM | POA: Diagnosis not present

## 2020-03-26 DIAGNOSIS — Z79899 Other long term (current) drug therapy: Secondary | ICD-10-CM | POA: Diagnosis not present

## 2020-03-26 DIAGNOSIS — M0570 Rheumatoid arthritis with rheumatoid factor of unspecified site without organ or systems involvement: Secondary | ICD-10-CM | POA: Diagnosis not present

## 2020-04-04 DIAGNOSIS — M0609 Rheumatoid arthritis without rheumatoid factor, multiple sites: Secondary | ICD-10-CM | POA: Diagnosis not present

## 2020-04-04 DIAGNOSIS — M150 Primary generalized (osteo)arthritis: Secondary | ICD-10-CM | POA: Diagnosis not present

## 2020-04-04 DIAGNOSIS — Z6820 Body mass index (BMI) 20.0-20.9, adult: Secondary | ICD-10-CM | POA: Diagnosis not present

## 2020-04-04 DIAGNOSIS — Z23 Encounter for immunization: Secondary | ICD-10-CM | POA: Diagnosis not present

## 2020-04-04 DIAGNOSIS — E876 Hypokalemia: Secondary | ICD-10-CM | POA: Diagnosis not present

## 2020-04-04 DIAGNOSIS — Z79899 Other long term (current) drug therapy: Secondary | ICD-10-CM | POA: Diagnosis not present

## 2020-04-04 DIAGNOSIS — M2550 Pain in unspecified joint: Secondary | ICD-10-CM | POA: Diagnosis not present

## 2020-05-01 ENCOUNTER — Ambulatory Visit: Payer: PPO | Admitting: Family Medicine

## 2020-05-01 ENCOUNTER — Other Ambulatory Visit: Payer: Self-pay

## 2020-05-01 ENCOUNTER — Encounter: Payer: Self-pay | Admitting: Family Medicine

## 2020-05-01 VITALS — BP 132/74 | HR 82 | Ht 66.5 in | Wt 132.0 lb

## 2020-05-01 DIAGNOSIS — M069 Rheumatoid arthritis, unspecified: Secondary | ICD-10-CM

## 2020-05-01 DIAGNOSIS — M48061 Spinal stenosis, lumbar region without neurogenic claudication: Secondary | ICD-10-CM

## 2020-05-01 DIAGNOSIS — M999 Biomechanical lesion, unspecified: Secondary | ICD-10-CM | POA: Diagnosis not present

## 2020-05-01 NOTE — Patient Instructions (Signed)
Good to see you  See me in 6 weeks 

## 2020-05-01 NOTE — Assessment & Plan Note (Signed)
Chronic problem, seems to be relatively stable.  Patient is taking gabapentin very intermittently.  Patient continues to have intermittent flares.  He does have the rheumatoid arthritis.  We did discuss that if this continues we do need to consider advanced imaging.  Patient at this point does not think it would change medical management with her not wanting injections and is not doing significant amount of medications.  Discussed icing regimen and home exercises.  Follow-up again in 6 weeks for manipulation

## 2020-05-01 NOTE — Progress Notes (Signed)
[facility] [place] Phone: [phone] Subjective:   Gloria [name], am serving as a scribe for Dr. Hulan Saas. This visit occurred during the SARS-CoV-2 public health emergency.  Safety protocols were in place, including screening questions prior to the visit, additional usage of staff PPE, and extensive cleaning of exam room while observing appropriate contact time as indicated for disinfecting solutions.   I'm seeing this patient by the request  of:  Glenis Smoker, MD  CC: Back pain follow-up  [id number]  Gloria [name] is a 74 y.o. female coming in with complaint of back and neck pain. OMT 03/20/2020. Patient states that when she does more with her arms her back will get sore. Otherwise doing ok today.  Medications patient has been prescribed: Vit D  Taking:  Last x-rays of the back showed multilevel degenerative disc disease at multiple levels severe mostly at L5-S1.       Reviewed prior external information including notes and imaging from previsou exam, outside providers and external EMR if available.   As well as notes that were available from care everywhere and other healthcare systems.  Past medical history, social, surgical and family history all reviewed in electronic medical record.  [name] unless stated regarding to the chief complaint.   Past Medical History:  Diagnosis Date  . Anxiety   . Arthritis   . CKD (chronic kidney disease), stage III ([place])   . Complication of anesthesia 05/2014   had body shaking-observed over night-see anesthesia note-has had tremors post op in past  . Depression   . Hyperlipemia   . Hypertension   . Insomnia     Allergies  Allergen Reactions  . Versed [Midazolam] Other (See Comments)    Uncontrolled shaking  . Diprivan [Propofol] Other (See Comments)    Uncontrolled shaking  . Fentanyl Other (See Comments)    Uncontrolled shaking   . Sevoflurane Other (See Comments)    Uncontrolled shaking  . Zofran [Ondansetron Hcl] Other (See Comments)    Uncontrolled shaking     Review of Systems:  [name] headache, visual changes, nausea, vomiting, diarrhea, constipation, dizziness, abdominal pain, skin rash, fevers, chills, night sweats, weight loss, swollen lymph nodes, body aches, joint swelling, chest pain, shortness of breath, mood changes. POSITIVE muscle aches  Objective  There were [name] vitals taken for this visit.   General: [name] apparent distress alert and oriented x3 mood and affect normal, dressed appropriately.  HEENT: Pupils equal, extraocular movements intact  Respiratory: Patient's speak in full sentences and does not appear short of breath  Cardiovascular: [name] lower extremity edema, non tender, [name] erythema  Gait normal with good balance and coordination.  Low back exam mild loss of lordosis.  Patient does have the degenerative scoliosis noted.  Patient does have mild tightness noted.  Negative straight leg test.  5/5 strength in lower extremities   Osteopathic findings   C5 flexed rotated and side bent left T3 extended rotated and side bent right inhaled rib T8 extended rotated and side bent left L2 flexed rotated and side bent right Sacrum right on right       Assessment and Plan:  Degenerative lumbar spinal stenosis Chronic problem, seems to be relatively stable.  Patient is taking gabapentin very intermittently.  Patient continues to have intermittent flares.  He does have the rheumatoid arthritis.  We did discuss that if this continues we do need to consider advanced imaging.  Patient at this point does not think it would change medical management with her not wanting injections and is not doing significant amount of medications.  Discussed icing regimen and home exercises.  Follow-up again in 6 weeks for manipulation    Nonallopathic problems  Decision today to treat with OMT was based on Physical  Exam  After verbal consent patient was treated with  ME, FPR techniques in cervical, rib, thoracic, lumbar, and sacral  areas  Patient tolerated the procedure well with improvement in symptoms  Patient given exercises, stretches and lifestyle modifications  See medications in patient instructions if given  Patient will follow up in 6 weeks      The above documentation has been reviewed and is accurate and complete Judi Saa, DO       Note: This dictation was prepared with Dragon dictation along with smaller phrase technology. Any transcriptional errors that result from this process are unintentional.

## 2020-06-11 NOTE — Progress Notes (Unsigned)
[name] [place] Phone: [phone] Subjective:   Rito Ehrlich, am serving as a scribe for Dr. Hulan Saas.  This visit occurred during the SARS-CoV-2 public health emergency.  Safety protocols were in place, including screening questions prior to the visit, additional usage of staff PPE, and extensive cleaning of exam room while observing appropriate contact time as indicated for disinfecting solutions.    I'm seeing this patient by the request  of:  Glenis Smoker, MD  CC: Back and neck pain follow-up  [id number]  Gloria [name] is a 75 y.o. female coming in with complaint of back and neck pain. OMT 05/01/2020. Patient states that her back has been doing alright just a little sore some days.  Nothing that stops her from any activity.  Describes the pain as a dull, throbbing aching discomfort.  No radiation to any of the extremities.  Medications patient has been prescribed: Zanaflex  Taking: Intermittently         Reviewed prior external information including notes and imaging from previsou exam, outside providers and external EMR if available.   As well as notes that were available from care everywhere and other healthcare systems.  Past medical history, social, surgical and family history all reviewed in electronic medical record.  No pertanent information unless stated regarding to the chief complaint.   Past Medical History:  Diagnosis Date  . Anxiety   . Arthritis   . CKD (chronic kidney disease), stage III ([place])   . Complication of anesthesia 05/2014   had body shaking-observed over night-see anesthesia note-has had tremors post op in past  . Depression   . Hyperlipemia   . Hypertension   . Insomnia     Allergies  Allergen Reactions  . Versed [Midazolam] Other (See Comments)    Uncontrolled shaking  . Diprivan [Propofol] Other (See Comments)    Uncontrolled shaking  . Fentanyl  Other (See Comments)    Uncontrolled shaking  . Sevoflurane Other (See Comments)    Uncontrolled shaking  . Zofran [Ondansetron Hcl] Other (See Comments)    Uncontrolled shaking     Review of Systems:  No headache, visual changes, nausea, vomiting, diarrhea, constipation, dizziness, abdominal pain, skin rash, fevers, chills, night sweats, weight loss, swollen lymph nodes, body aches, joint swelling, chest pain, shortness of breath, mood changes. POSITIVE muscle aches  Objective  Blood pressure (!) 150/80, pulse 72, height 5' 6.5" (1.689 m), weight 133 lb (60.3 kg), SpO2 98 %.   General: No apparent distress alert and oriented x3 mood and affect normal, dressed appropriately.  HEENT: Pupils equal, extraocular movements intact  Respiratory: Patient's speak in full sentences and does not appear short of breath  Cardiovascular: No lower extremity edema, non tender, no erythema  Gait normal with good balance and coordination.  MSK:  Non tender with full range of motion and good stability and symmetric strength and tone of shoulders, elbows, wrist, hip, knee and ankles bilaterally.  Back -significant scoliosis noted.  Tightness noted in the paraspinal musculature lumbar spine right greater than left.  Patient does have tightness noted in the right parascapular region as well.  Mild tightness with Corky Sox as well.  Osteopathic findings  C4 flexed rotated and side bent right T3 extended rotated and side bent right inhaled rib T8 extended rotated and side bent left L2 flexed rotated and side bent right Sacrum right on right       Assessment  and Plan:  Degenerative lumbar spinal stenosis Patient does have some degenerative changes of the spinal stenosis that I do think contributes.  Patient has done relatively well with conservative therapy.  Patient does take gabapentin intermittently and does have the Zanaflex if necessary.  Discussed icing regimen and home exercises.  Increase activity  slowly.  Follow-up with me again 6 weeks   Nonallopathic problems  Decision today to treat with OMT was based on Physical Exam  After verbal consent patient was treated with HVLA, ME, FPR techniques in cervical, rib, thoracic, lumbar, and sacral  areas  Patient tolerated the procedure well with improvement in symptoms  Patient given exercises, stretches and lifestyle modifications  See medications in patient instructions if given  Patient will follow up in 4-8 weeks      The above documentation has been reviewed and is accurate and complete Lyndal Pulley, DO       Note: This dictation was prepared with Dragon dictation along with smaller phrase technology. Any transcriptional errors that result from this process are unintentional.

## 2020-06-12 ENCOUNTER — Encounter: Payer: Self-pay | Admitting: Family Medicine

## 2020-06-12 ENCOUNTER — Ambulatory Visit: Payer: PPO | Admitting: Family Medicine

## 2020-06-12 ENCOUNTER — Other Ambulatory Visit: Payer: Self-pay

## 2020-06-12 VITALS — BP 150/80 | HR 72 | Ht 66.5 in | Wt 133.0 lb

## 2020-06-12 DIAGNOSIS — M999 Biomechanical lesion, unspecified: Secondary | ICD-10-CM

## 2020-06-12 DIAGNOSIS — M48061 Spinal stenosis, lumbar region without neurogenic claudication: Secondary | ICD-10-CM | POA: Diagnosis not present

## 2020-06-12 NOTE — Assessment & Plan Note (Signed)
Patient does have some degenerative changes of the spinal stenosis that I do think contributes.  Patient has done relatively well with conservative therapy.  Patient does take gabapentin intermittently and does have the Zanaflex if necessary.  Discussed icing regimen and home exercises.  Increase activity slowly.  Follow-up with me again 6 weeks

## 2020-06-12 NOTE — Patient Instructions (Addendum)
Good to see you  A little tight today See me again in 6 weeks

## 2020-06-19 DIAGNOSIS — I10 Essential (primary) hypertension: Secondary | ICD-10-CM | POA: Diagnosis not present

## 2020-06-28 DIAGNOSIS — I10 Essential (primary) hypertension: Secondary | ICD-10-CM | POA: Diagnosis not present

## 2020-07-10 DIAGNOSIS — H353132 Nonexudative age-related macular degeneration, bilateral, intermediate dry stage: Secondary | ICD-10-CM | POA: Diagnosis not present

## 2020-07-10 DIAGNOSIS — H43813 Vitreous degeneration, bilateral: Secondary | ICD-10-CM | POA: Diagnosis not present

## 2020-07-10 DIAGNOSIS — Z961 Presence of intraocular lens: Secondary | ICD-10-CM | POA: Diagnosis not present

## 2020-07-10 DIAGNOSIS — Z79899 Other long term (current) drug therapy: Secondary | ICD-10-CM | POA: Diagnosis not present

## 2020-07-10 DIAGNOSIS — M0520 Rheumatoid vasculitis with rheumatoid arthritis of unspecified site: Secondary | ICD-10-CM | POA: Diagnosis not present

## 2020-07-17 DIAGNOSIS — I10 Essential (primary) hypertension: Secondary | ICD-10-CM | POA: Diagnosis not present

## 2020-07-23 NOTE — Progress Notes (Signed)
[place] [address] [place] Phone: [phone] Subjective:   I [name] am serving as a Education administrator for Dr. Hulan Saas.  This visit occurred during the SARS-CoV-2 public health emergency.  Safety protocols were in place, including screening questions prior to the visit, additional usage of staff PPE, and extensive cleaning of exam room while observing appropriate contact time as indicated for disinfecting solutions.   I'm seeing this patient by the request  of:  Glenis Smoker, MD  CC: Low back pain follow-up, new shoulder pain  [id number]  Ingrid L Vicars is a 75 y.o. female coming in with complaint of back and neck pain. OMT 06/12/2020. Patient states [place] the gun range about 3 weeks ago and her shoulder has been painful ever since. Would like to know if she can get a prescription for her pain.   Medications patient has been prescribed: None          Reviewed prior external information including notes and imaging from previsou exam, outside providers and external EMR if available.   As well as notes that were available from care everywhere and other healthcare systems.  Past medical history, social, surgical and family history all reviewed in electronic medical record.  No pertanent information unless stated regarding to the chief complaint.   Past Medical History:  Diagnosis Date  . Anxiety   . Arthritis   . CKD (chronic kidney disease), stage III ([place])   . Complication of anesthesia 05/2014   had body shaking-observed over night-see anesthesia note-has had tremors post op in past  . Depression   . Hyperlipemia   . Hypertension   . Insomnia     Allergies  Allergen Reactions  . Versed [Midazolam] Other (See Comments)    Uncontrolled shaking  . Diprivan [Propofol] Other (See Comments)    Uncontrolled shaking  . Fentanyl Other (See Comments)    Uncontrolled shaking  . Sevoflurane Other (See  Comments)    Uncontrolled shaking  . Zofran [Ondansetron Hcl] Other (See Comments)    Uncontrolled shaking     Review of Systems:  No headache, visual changes, nausea, vomiting, diarrhea, constipation, dizziness, abdominal pain, skin rash, fevers, chills, night sweats, weight loss, swollen lymph nodes, body aches, joint swelling, chest pain, shortness of breath, mood changes. POSITIVE muscle aches  Objective  There were no vitals taken for this visit.   General: No apparent distress alert and oriented x3 mood and affect normal, dressed appropriately.  HEENT: Pupils equal, extraocular movements intact  Respiratory: Patient's speak in full sentences and does not appear short of breath  Cardiovascular: No lower extremity edema, non tender, no erythema  Right shoulder exam shows the patient does have some weakness noted with empty can.  Patient does have positive impingement.  Weakness with the infraspinatus against resistance. Low back exam does have loss of lordosis.  Significant degenerative scoliosis noted as well.  Tender to palpation more in the paraspinal musculature.  Osteopathic findings  C4 flexed rotated and side bent left T3 extended rotated and side bent right inhaled rib T8 extended rotated and side bent left L2 flexed rotated and side bent right Sacrum right on right       Assessment and Plan:  Complete tear of right rotator cuff Patient does have weakness noted.  Has had complete tear of the rotator cuff and potentially increasing weakness again.  Discussed with patient about icing regimen, home exercises, which activities to  do which wants to avoid.  Patient will get x-rays today.  Patient will follow up with me again 4 weeks.  Worsening pain or weakness may need to consider the possibility of advanced imaging.  We will consider the possibility of ultrasound at follow-up.  Degenerative lumbar spinal stenosis Degenerative disc disease.  Multiple levels.  Patient does  have scoliosis as well.  Responding fairly well to osteopathic regular basis.  Does respond well to manipulation follow-up again in 4 to 6 weeks    Nonallopathic problems  Decision today to treat with OMT was based on Physical Exam  After verbal consent patient was treated with HVLA, ME, FPR techniques in cervical, rib, thoracic, lumbar, and sacral  areas avoided HVLA on the neck  Patient tolerated the procedure well with improvement in symptoms  Patient given exercises, stretches and lifestyle modifications  See medications in patient instructions if given  Patient will follow up in 4-8 weeks      The above documentation has been reviewed and is accurate and complete Jacqualin Combes       Note: This dictation was prepared with Dragon dictation along with smaller phrase technology. Any transcriptional errors that result from this process are unintentional.

## 2020-07-24 ENCOUNTER — Other Ambulatory Visit: Payer: Self-pay

## 2020-07-24 ENCOUNTER — Encounter: Payer: Self-pay | Admitting: Family Medicine

## 2020-07-24 ENCOUNTER — Ambulatory Visit: Payer: PPO | Admitting: Family Medicine

## 2020-07-24 ENCOUNTER — Ambulatory Visit (INDEPENDENT_AMBULATORY_CARE_PROVIDER_SITE_OTHER): Payer: PPO

## 2020-07-24 VITALS — BP 160/90 | HR 73 | Ht 66.5 in | Wt 133.0 lb

## 2020-07-24 DIAGNOSIS — G8929 Other chronic pain: Secondary | ICD-10-CM

## 2020-07-24 DIAGNOSIS — M48061 Spinal stenosis, lumbar region without neurogenic claudication: Secondary | ICD-10-CM

## 2020-07-24 DIAGNOSIS — M75121 Complete rotator cuff tear or rupture of right shoulder, not specified as traumatic: Secondary | ICD-10-CM | POA: Diagnosis not present

## 2020-07-24 DIAGNOSIS — M25511 Pain in right shoulder: Secondary | ICD-10-CM | POA: Diagnosis not present

## 2020-07-24 DIAGNOSIS — M999 Biomechanical lesion, unspecified: Secondary | ICD-10-CM | POA: Diagnosis not present

## 2020-07-24 NOTE — Assessment & Plan Note (Signed)
Patient does have weakness noted.  Has had complete tear of the rotator cuff and potentially increasing weakness again.  Discussed with patient about icing regimen, home exercises, which activities to do which wants to avoid.  Patient will get x-rays today.  Patient will follow up with me again 4 weeks.  Worsening pain or weakness may need to consider the possibility of advanced imaging.  We will consider the possibility of ultrasound at follow-up.

## 2020-07-24 NOTE — Assessment & Plan Note (Signed)
Degenerative disc disease.  Multiple levels.  Patient does have scoliosis as well.  Responding fairly well to osteopathic regular basis.  Does respond well to manipulation follow-up again in 4 to 6 weeks

## 2020-07-24 NOTE — Patient Instructions (Addendum)
Good to see you Rotator cuff exercises Xray of the right shoulder Pennsaid 2 times a day Hands within peripenial vision See me again in 4-6 weeks

## 2020-08-15 DIAGNOSIS — H35363 Drusen (degenerative) of macula, bilateral: Secondary | ICD-10-CM | POA: Diagnosis not present

## 2020-08-15 DIAGNOSIS — Z79899 Other long term (current) drug therapy: Secondary | ICD-10-CM | POA: Diagnosis not present

## 2020-09-04 ENCOUNTER — Ambulatory Visit: Payer: PPO | Admitting: Dermatology

## 2020-09-04 ENCOUNTER — Other Ambulatory Visit: Payer: Self-pay

## 2020-09-04 DIAGNOSIS — D485 Neoplasm of uncertain behavior of skin: Secondary | ICD-10-CM

## 2020-09-04 DIAGNOSIS — L821 Other seborrheic keratosis: Secondary | ICD-10-CM

## 2020-09-04 DIAGNOSIS — L568 Other specified acute skin changes due to ultraviolet radiation: Secondary | ICD-10-CM | POA: Diagnosis not present

## 2020-09-04 DIAGNOSIS — L570 Actinic keratosis: Secondary | ICD-10-CM

## 2020-09-04 DIAGNOSIS — L988 Other specified disorders of the skin and subcutaneous tissue: Secondary | ICD-10-CM | POA: Diagnosis not present

## 2020-09-04 DIAGNOSIS — L820 Inflamed seborrheic keratosis: Secondary | ICD-10-CM | POA: Diagnosis not present

## 2020-09-04 MED ORDER — TRETINOIN 0.05 % EX GEL: 1.0000 "application " | Freq: Every day | CUTANEOUS | 2 refills | Status: DC

## 2020-09-04 NOTE — Progress Notes (Signed)
[place] Phone: [phone] Subjective:   [name], am serving as a scribe for Dr. Hulan Saas. This visit occurred during the SARS-CoV-2 public health emergency.  Safety protocols were in place, including screening questions prior to the visit, additional usage of staff PPE, and extensive cleaning of exam room while observing appropriate contact time as indicated for disinfecting solutions.   I'm seeing this patient by the request  of:  Glenis Smoker, MD  CC: Back and neck pain,  [id number]  Gloria [name] is a 75 y.o. female coming in with complaint of back and neck pain. OMT 07/24/2020. Patient states that her R shoulder is improving with use of Pennsaid. No changes in neck and back pain.  Patient states that overall seems to be doing relatively well.  Patient states that she lives on vacation and thinks that this may have been helpful.  Medications patient has been prescribed: None         Reviewed prior external information including notes and imaging from previsou exam, outside providers and external EMR if available.   As well as notes that were available from care everywhere and other healthcare systems.  Past medical history, social, surgical and family history all reviewed in electronic medical record.  No pertanent information unless stated regarding to the chief complaint.   Past Medical History:  Diagnosis Date  . Anxiety   . Arthritis   . CKD (chronic kidney disease), stage III ([place])   . Complication of anesthesia 05/2014   had body shaking-observed over night-see anesthesia note-has had tremors post op in past  . Depression   . Hyperlipemia   . Hypertension   . Insomnia     Allergies  Allergen Reactions  . Versed [Midazolam] Other (See Comments)    Uncontrolled shaking  . Diprivan [Propofol] Other (See Comments)    Uncontrolled shaking  . Fentanyl Other (See  Comments)    Uncontrolled shaking  . Sevoflurane Other (See Comments)    Uncontrolled shaking  . Zofran [Ondansetron Hcl] Other (See Comments)    Uncontrolled shaking     Review of Systems:  No headache, visual changes, nausea, vomiting, diarrhea, constipation, dizziness, abdominal pain, skin rash, fevers, chills, night sweats, weight loss, swollen lymph nodes, body aches, joint swelling, chest pain, shortness of breath, mood changes. POSITIVE muscle aches  Objective  Blood pressure 130/78, pulse 77, height 5\' 6"  (1.676 m), weight 134 lb (60.8 kg), SpO2 98 %.   General: No apparent distress alert and oriented x3 mood and affect normal, dressed appropriately.  HEENT: Pupils equal, extraocular movements intact  Respiratory: Patient's speak in full sentences and does not appear short of breath  Cardiovascular: No lower extremity edema, non tender, no erythema  Gait normal with good balance and coordination.  MSK: Patient does have loss of lordosis.  Patient does have some scoliosis noted. Very mild tightness noted in the paraspinal musculature.  Mild tightness around the sacroiliac joint.   Osteopathic findings C3 flexed rotated and side bent right T4 extended rotated and side bent right inhaled rib L2 flexed rotated and side bent right Sacrum left on left      Assessment and Plan:  Degenerative lumbar spinal stenosis Chronic problem but seems to be stable.  Patient has been doing relatively well.  Patient does have the gabapentin.  Chronic problem with intermittent exacerbations.  Discussed need muscle relaxer as well.  Not having a severe  amount of pain that she has had previously.  We will follow-up again in 6 weeks    Nonallopathic problems  Decision today to treat with OMT was based on Physical Exam  After verbal consent patient was treated with HVLA, ME, FPR techniques in cervical, rib, thoracic, lumbar, and sacral  areas  Patient tolerated the procedure well with  improvement in symptoms  Patient given exercises, stretches and lifestyle modifications  See medications in patient instructions if given  Patient will follow up in 4-8 weeks      The above documentation has been reviewed and is accurate and complete Lyndal Pulley, DO       Note: This dictation was prepared with Dragon dictation along with smaller phrase technology. Any transcriptional errors that result from this process are unintentional.

## 2020-09-04 NOTE — Patient Instructions (Signed)

## 2020-09-05 ENCOUNTER — Ambulatory Visit: Payer: PPO | Admitting: Family Medicine

## 2020-09-05 ENCOUNTER — Encounter: Payer: Self-pay | Admitting: Family Medicine

## 2020-09-05 ENCOUNTER — Other Ambulatory Visit: Payer: Self-pay

## 2020-09-05 VITALS — BP 130/78 | HR 77 | Ht 66.0 in | Wt 134.0 lb

## 2020-09-05 DIAGNOSIS — M9903 Segmental and somatic dysfunction of lumbar region: Secondary | ICD-10-CM | POA: Diagnosis not present

## 2020-09-05 DIAGNOSIS — M48061 Spinal stenosis, lumbar region without neurogenic claudication: Secondary | ICD-10-CM

## 2020-09-05 DIAGNOSIS — M9904 Segmental and somatic dysfunction of sacral region: Secondary | ICD-10-CM | POA: Diagnosis not present

## 2020-09-05 DIAGNOSIS — M9901 Segmental and somatic dysfunction of cervical region: Secondary | ICD-10-CM | POA: Diagnosis not present

## 2020-09-05 DIAGNOSIS — M9902 Segmental and somatic dysfunction of thoracic region: Secondary | ICD-10-CM

## 2020-09-05 DIAGNOSIS — M9908 Segmental and somatic dysfunction of rib cage: Secondary | ICD-10-CM

## 2020-09-05 NOTE — Patient Instructions (Signed)
Good to see you Overall doing better Keep mowing See me again in 6 weeks

## 2020-09-05 NOTE — Assessment & Plan Note (Signed)
Chronic problem but seems to be stable.  Patient has been doing relatively well.  Patient does have the gabapentin.  Chronic problem with intermittent exacerbations.  Discussed need muscle relaxer as well.  Not having a severe amount of pain that she has had previously.  We will follow-up again in 6 weeks

## 2020-09-16 ENCOUNTER — Encounter: Payer: Self-pay | Admitting: Dermatology

## 2020-09-16 NOTE — Progress Notes (Signed)
   New Patient   Subjective  Gloria [name] is a 75 y.o. female who presents for the following: Skin Problem (Patient has a spot on back x 10 years that does bleed when she scratches it. Also wants RX for tretinoin cream. ).  Growth on back with history of bleeding, requests tretinoin for facial sun damage.  General skin check. Location:  Duration:  Quality:  Associated Signs/Symptoms: Modifying Factors:  Severity:  Timing: Context:    The following portions of the chart were reviewed this encounter and updated as appropriate:  Tobacco  Allergies  Meds  Problems  Med Hx  Surg Hx  Fam Hx      Objective  Well appearing patient in no apparent distress; mood and affect are within normal limits. Objective  Right Lower Back: Noninflamed tan textured flattopped 5 mm papule  Objective  Right Upper Back: Slightly pink scale four millimeter.  Objective  Right Lower Back: Pearly pink 5 mm crust, rule out superficial SCCA versus I-S K.     Objective  Mid Forehead: Minor chronic solar induced damage to facial skin    A full examination was performed including scalp, head, eyes, ears, nose, lips, neck, chest, axillae, abdomen, back, buttocks, bilateral upper extremities, bilateral lower extremities, hands, feet, fingers, toes, fingernails, and toenails. All findings within normal limits unless otherwise noted below.  Areas below undergarments not fully examined.   Assessment & Plan  Seborrheic keratosis Right Lower Back  Benign no treatment needed.   Lichenoid keratosis Right Upper Back  No treatment needed.   Neoplasm of uncertain behavior of skin Right Lower Back  Skin / nail biopsy Type of biopsy: tangential   Informed consent: discussed and consent obtained   Timeout: patient name, date of birth, surgical site, and procedure verified   Anesthesia: the lesion was anesthetized in a standard fashion   Anesthetic:  1% lidocaine w/ epinephrine 1-100,000 local  infiltration Instrument used: flexible razor blade   Hemostasis achieved with: ferric subsulfate   Outcome: patient tolerated procedure well   Post-procedure details: wound care instructions given    Specimen 1 - Surgical pathology Differential Diagnosis: scc vs bcc  Check Margins: No  Age-related facial wrinkles  Ordered Medications: tretinoin (ALTRALIN) 0.05 % gel  Solar radiation dermatitis Mid Forehead  May use tretinoin 0.05% cream nightly.  Warned of irritation.

## 2020-09-24 DIAGNOSIS — H353131 Nonexudative age-related macular degeneration, bilateral, early dry stage: Secondary | ICD-10-CM | POA: Diagnosis not present

## 2020-09-24 DIAGNOSIS — H35033 Hypertensive retinopathy, bilateral: Secondary | ICD-10-CM | POA: Diagnosis not present

## 2020-09-24 DIAGNOSIS — Z79899 Other long term (current) drug therapy: Secondary | ICD-10-CM | POA: Diagnosis not present

## 2020-09-24 DIAGNOSIS — M0570 Rheumatoid arthritis with rheumatoid factor of unspecified site without organ or systems involvement: Secondary | ICD-10-CM | POA: Diagnosis not present

## 2020-10-03 DIAGNOSIS — M2550 Pain in unspecified joint: Secondary | ICD-10-CM | POA: Diagnosis not present

## 2020-10-03 DIAGNOSIS — M0609 Rheumatoid arthritis without rheumatoid factor, multiple sites: Secondary | ICD-10-CM | POA: Diagnosis not present

## 2020-10-03 DIAGNOSIS — M150 Primary generalized (osteo)arthritis: Secondary | ICD-10-CM | POA: Diagnosis not present

## 2020-10-03 DIAGNOSIS — Z79899 Other long term (current) drug therapy: Secondary | ICD-10-CM | POA: Diagnosis not present

## 2020-10-03 DIAGNOSIS — Z6821 Body mass index (BMI) 21.0-21.9, adult: Secondary | ICD-10-CM | POA: Diagnosis not present

## 2020-10-19 NOTE — Progress Notes (Signed)
[facility] [address] [place] Phone: [phone] Subjective:   Gloria [name] am serving as a Education administrator for Dr. Hulan Saas.  This visit occurred during the SARS-CoV-2 public health emergency.  Safety protocols were in place, including screening questions prior to the visit, additional usage of staff PPE, and extensive cleaning of exam room while observing appropriate contact time as indicated for disinfecting solutions.   Gloria'm seeing this patient by the request  of:  Glenis Smoker, MD  CC: Back and neck pain follow-up  [id number]  Gloria [name] is a 75 y.o. female coming in with complaint of back and neck pain. OMT 09/05/2020.  Patient states the past 4 weeks she has been bike riding. States she is much better.  Patient has been averaging 8 to 10 miles over the weekend.  Feels like that has been very helpful.          Reviewed prior external information including notes and imaging from previsou exam, outside providers and external EMR if available.   As well as notes that were available from care everywhere and other healthcare systems.  Past medical history, social, surgical and family history all reviewed in electronic medical record.  No pertanent information unless stated regarding to the chief complaint.   Past Medical History:  Diagnosis Date   Anxiety    Arthritis    CKD (chronic kidney disease), stage III ([place])    Complication of anesthesia 05/2014   had body shaking-observed over night-see anesthesia note-has had tremors post op in past   Depression    Hyperlipemia    Hypertension    Insomnia     Allergies  Allergen Reactions   Versed [Midazolam] Other (See Comments)    Uncontrolled shaking   Diprivan [Propofol] Other (See Comments)    Uncontrolled shaking   Fentanyl Other (See Comments)    Uncontrolled shaking   Sevoflurane Other (See Comments)    Uncontrolled shaking   Zofran [Ondansetron Hcl]  Other (See Comments)    Uncontrolled shaking     Review of Systems:  No headache, visual changes, nausea, vomiting, diarrhea, constipation, dizziness, abdominal pain, skin rash, fevers, chills, night sweats, weight loss, swollen lymph nodes, body aches, joint swelling, chest pain, shortness of breath, mood changes. POSITIVE muscle aches  Objective  Blood pressure 120/70, pulse 73, height 5\' 6"  (1.676 m), weight 135 lb (61.2 kg), SpO2 99 %.   General: No apparent distress alert and oriented x3 mood and affect normal, dressed appropriately.  HEENT: Pupils equal, extraocular movements intact  Respiratory: Patient's speak in full sentences and does not appear short of breath  Cardiovascular: No lower extremity edema, non tender, no erythema  Significant scoliosis noted of the lumbar spine.  Patient does have tightness in the paraspinal musculature as well as increased tightness of the hip flexors.  Tightness around the right sacroiliac joint.  This is different than patient's usual side.  Osteopathic findings  C4 flexed rotated and side bent right T3 extended rotated and side bent right inhaled rib L2 flexed rotated and side bent right Sacrum right on right       Assessment and Plan:  Degenerative lumbar spinal stenosis Known degenerative spinal stenosis.  Patient has been doing more biking and may be potentially making some improvement.  Discussed with patient continuing to stay active.  Discussed icing regimen and home exercises, increase activity slowly.  Follow-up with me again in 6 to 8 weeks chronic  problem with exacerbation.  Discussed certain medications including gabapentin.   Nonallopathic problems  Decision today to treat with OMT was based on Physical Exam  After verbal consent patient was treated with HVLA, ME, FPR techniques in cervical, rib, thoracic, lumbar, and sacral  areas  Patient tolerated the procedure well with improvement in symptoms  Patient given  exercises, stretches and lifestyle modifications  See medications in patient instructions if given  Patient will follow up in 4-8 weeks      The above documentation has been reviewed and is accurate and complete Lyndal Pulley, DO        Note: This dictation was prepared with Dragon dictation along with smaller phrase technology. Any transcriptional errors that result from this process are unintentional.

## 2020-10-23 ENCOUNTER — Telehealth: Payer: Self-pay | Admitting: Dermatology

## 2020-10-23 ENCOUNTER — Encounter: Payer: Self-pay | Admitting: Family Medicine

## 2020-10-23 ENCOUNTER — Ambulatory Visit: Payer: PPO | Admitting: Family Medicine

## 2020-10-23 ENCOUNTER — Other Ambulatory Visit: Payer: Self-pay

## 2020-10-23 VITALS — BP 120/70 | HR 73 | Ht 66.0 in | Wt 135.0 lb

## 2020-10-23 DIAGNOSIS — M9901 Segmental and somatic dysfunction of cervical region: Secondary | ICD-10-CM

## 2020-10-23 DIAGNOSIS — M9903 Segmental and somatic dysfunction of lumbar region: Secondary | ICD-10-CM

## 2020-10-23 DIAGNOSIS — M9902 Segmental and somatic dysfunction of thoracic region: Secondary | ICD-10-CM | POA: Diagnosis not present

## 2020-10-23 DIAGNOSIS — M9908 Segmental and somatic dysfunction of rib cage: Secondary | ICD-10-CM | POA: Diagnosis not present

## 2020-10-23 DIAGNOSIS — M48061 Spinal stenosis, lumbar region without neurogenic claudication: Secondary | ICD-10-CM | POA: Diagnosis not present

## 2020-10-23 DIAGNOSIS — M9904 Segmental and somatic dysfunction of sacral region: Secondary | ICD-10-CM

## 2020-10-23 NOTE — Assessment & Plan Note (Addendum)
Known degenerative spinal stenosis.  Patient has been doing more biking and may be potentially making some improvement.  Discussed with patient continuing to stay active.  Discussed icing regimen and home exercises, increase activity slowly.  Follow-up with me again in 6 to 8 weeks chronic problem with exacerbation.  Discussed certain medications including gabapentin.

## 2020-10-23 NOTE — Telephone Encounter (Signed)
ST gave her Rx for tretinoin cream in May, pharmacist said insurance wouldn't pay for it, and pharmacy contacted [place] last week but they told her  they haven't heard back from [place].Wants to know what status is

## 2020-10-23 NOTE — Telephone Encounter (Signed)
Tretinoin for wrinkles is cosmetic and this is a cash pay rx patient aware to use goodrx

## 2020-10-23 NOTE — Patient Instructions (Signed)
Good to see you Stretch after bike riding See me again in 6 weeks

## 2020-10-26 DIAGNOSIS — N3000 Acute cystitis without hematuria: Secondary | ICD-10-CM | POA: Diagnosis not present

## 2020-11-12 ENCOUNTER — Telehealth: Payer: Self-pay | Admitting: Dermatology

## 2020-11-12 NOTE — Telephone Encounter (Signed)
Pathology to patinet

## 2020-11-12 NOTE — Telephone Encounter (Signed)
Patient is calling for pathology results from last visit with Stuart Tafeen, MD 

## 2020-12-05 ENCOUNTER — Ambulatory Visit: Payer: PPO | Admitting: Family Medicine

## 2020-12-05 ENCOUNTER — Other Ambulatory Visit: Payer: Self-pay

## 2020-12-05 ENCOUNTER — Encounter: Payer: Self-pay | Admitting: Family Medicine

## 2020-12-05 VITALS — BP 120/80 | HR 73 | Ht 66.0 in | Wt 135.0 lb

## 2020-12-05 DIAGNOSIS — M48061 Spinal stenosis, lumbar region without neurogenic claudication: Secondary | ICD-10-CM | POA: Diagnosis not present

## 2020-12-05 DIAGNOSIS — M47812 Spondylosis without myelopathy or radiculopathy, cervical region: Secondary | ICD-10-CM

## 2020-12-05 DIAGNOSIS — M9908 Segmental and somatic dysfunction of rib cage: Secondary | ICD-10-CM

## 2020-12-05 DIAGNOSIS — M9902 Segmental and somatic dysfunction of thoracic region: Secondary | ICD-10-CM

## 2020-12-05 DIAGNOSIS — M9904 Segmental and somatic dysfunction of sacral region: Secondary | ICD-10-CM | POA: Diagnosis not present

## 2020-12-05 DIAGNOSIS — M9903 Segmental and somatic dysfunction of lumbar region: Secondary | ICD-10-CM

## 2020-12-05 DIAGNOSIS — M9901 Segmental and somatic dysfunction of cervical region: Secondary | ICD-10-CM

## 2020-12-05 NOTE — Progress Notes (Signed)
[name] [place] Phone: [phone] Subjective:   Rito Ehrlich, am serving as a scribe for Dr. Hulan Saas.  I'm seeing this patient by the request  of:  Glenis Smoker, MD  CC: Back pain follow-up  [id number]  Gloria [name] is a 75 y.o. female coming in with complaint of back and neck pain OMT 10/23/2020. Patient states that she is doing well that a couple of weeks a go she had some Left shoulder pain but that resolved itself   Medications patient has been prescribed: none  Taking:           Past Medical History:  Diagnosis Date   Anxiety    Arthritis    CKD (chronic kidney disease), stage III ([place])    Complication of anesthesia 05/2014   had body shaking-observed over night-see anesthesia note-has had tremors post op in past   Depression    Hyperlipemia    Hypertension    Insomnia     Allergies  Allergen Reactions   Versed [Midazolam] Other (See Comments)    Uncontrolled shaking   Diprivan [Propofol] Other (See Comments)    Uncontrolled shaking   Fentanyl Other (See Comments)    Uncontrolled shaking   Sevoflurane Other (See Comments)    Uncontrolled shaking   Zofran [Ondansetron Hcl] Other (See Comments)    Uncontrolled shaking     Review of Systems:  No headache, visual changes, nausea, vomiting, diarrhea, constipation, dizziness, abdominal pain, skin rash, fevers, chills, night sweats, weight loss, swollen lymph nodes, body aches, joint swelling, chest pain, shortness of breath, mood changes. POSITIVE muscle aches  Objective  Blood pressure 120/80, pulse 73, height '5\' 6"'$  (1.676 m), weight 135 lb (61.2 kg), SpO2 98 %.   General: No apparent distress alert and oriented x3 mood and affect normal, dressed appropriately.  HEENT: Pupils equal, extraocular movements intact  Respiratory: Patient's speak in full sentences and does not appear short of breath  Cardiovascular: No  lower extremity edema, non tender, no erythema  Patient still has scoliosis noted.  Tightness noted in the paraspinal musculature of the lumbar spine laterally.On the right side.  Neck exam does have some tightness noted as well.  Out of 5 strength of all the extremities is noted.  Osteopathic findings  C2 flexed rotated and side bent right C6 flexed rotated and side bent left T3 extended rotated and side bent right inhaled rib T9 extended rotated and side bent left L2 flexed rotated and side bent right Sacrum right on right       Assessment and Plan:  Cervical spondylosis Patient has had difficulty with her neck previously.  Responded well to manipulation.  Discussed posture and ergonomics.  Discussed the strengthening of the scapular still noted.  Patient does have topical anti-inflammatories as needed as well as the gabapentin.  Follow-up again in 6 weeks   Nonallopathic problems  Decision today to treat with OMT was based on Physical Exam  After verbal consent patient was treated with HVLA, ME, FPR techniques in cervical, rib, thoracic, lumbar, and sacral  areas  Patient tolerated the procedure well with improvement in symptoms  Patient given exercises, stretches and lifestyle modifications  See medications in patient instructions if given  Patient will follow up in 6 weeks      The above documentation has been reviewed and is accurate and complete Lyndal Pulley, DO        Note:  This dictation was prepared with Dragon dictation along with smaller phrase technology. Any transcriptional errors that result from this process are unintentional.

## 2020-12-05 NOTE — Assessment & Plan Note (Signed)
Patient has had difficulty with her neck previously.  Responded well to manipulation.  Discussed posture and ergonomics.  Discussed the strengthening of the scapular still noted.  Patient does have topical anti-inflammatories as needed as well as the gabapentin.  Follow-up again in 6 weeks

## 2020-12-05 NOTE — Patient Instructions (Addendum)
Good to see you  Doing great over all See me again in 6 weeks

## 2020-12-05 NOTE — Assessment & Plan Note (Signed)
Known degenerative changes with scoliosis.  Continue the home exercises and icing regimen.  Follow-up again in 6 weeks

## 2020-12-13 DIAGNOSIS — Z03818 Encounter for observation for suspected exposure to other biological agents ruled out: Secondary | ICD-10-CM | POA: Diagnosis not present

## 2020-12-13 DIAGNOSIS — J069 Acute upper respiratory infection, unspecified: Secondary | ICD-10-CM | POA: Diagnosis not present

## 2020-12-18 DIAGNOSIS — Z79899 Other long term (current) drug therapy: Secondary | ICD-10-CM | POA: Diagnosis not present

## 2020-12-18 DIAGNOSIS — N1831 Chronic kidney disease, stage 3a: Secondary | ICD-10-CM | POA: Diagnosis not present

## 2020-12-18 DIAGNOSIS — M810 Age-related osteoporosis without current pathological fracture: Secondary | ICD-10-CM | POA: Diagnosis not present

## 2020-12-18 DIAGNOSIS — M069 Rheumatoid arthritis, unspecified: Secondary | ICD-10-CM | POA: Diagnosis not present

## 2020-12-18 DIAGNOSIS — E7800 Pure hypercholesterolemia, unspecified: Secondary | ICD-10-CM | POA: Diagnosis not present

## 2020-12-18 DIAGNOSIS — I10 Essential (primary) hypertension: Secondary | ICD-10-CM | POA: Diagnosis not present

## 2020-12-18 DIAGNOSIS — Z23 Encounter for immunization: Secondary | ICD-10-CM | POA: Diagnosis not present

## 2020-12-18 DIAGNOSIS — E559 Vitamin D deficiency, unspecified: Secondary | ICD-10-CM | POA: Diagnosis not present

## 2020-12-18 DIAGNOSIS — Z0000 Encounter for general adult medical examination without abnormal findings: Secondary | ICD-10-CM | POA: Diagnosis not present

## 2020-12-27 DIAGNOSIS — I10 Essential (primary) hypertension: Secondary | ICD-10-CM | POA: Diagnosis not present

## 2021-01-14 NOTE — Progress Notes (Signed)
[facility] [address] [place] Phone: [phone] Subjective:   IVilma [name], am serving as a scribe for Dr. Hulan Saas. This visit occurred during the SARS-CoV-2 public health emergency.  Safety protocols were in place, including screening questions prior to the visit, additional usage of staff PPE, and extensive cleaning of exam room while observing appropriate contact time as indicated for disinfecting solutions.   I'm seeing this patient by the request  of:  Glenis Smoker, MD  CC: Neck and back pain follow-up  [id number]  Gloria [name] is a 75 y.o. female coming in with complaint of back and neck pain. OMT 12/05/2020. Patient states continues to have the same pain but no new complaints.  Patient continues to try to stay active.  Patient is going to go on a long bike ride.  Patient is hoping that she will feel good enough to be able to do this.  Medications patient has been prescribed: None  Taking:         Reviewed prior external information including notes and imaging from previsou exam, outside providers and external EMR if available.   As well as notes that were available from care everywhere and other healthcare systems.  Past medical history, social, surgical and family history all reviewed in electronic medical record.  No pertanent information unless stated regarding to the chief complaint.   Past Medical History:  Diagnosis Date   Anxiety    Arthritis    CKD (chronic kidney disease), stage III ([place])    Complication of anesthesia 05/2014   had body shaking-observed over night-see anesthesia note-has had tremors post op in past   Depression    Hyperlipemia    Hypertension    Insomnia     Allergies  Allergen Reactions   Versed [Midazolam] Other (See Comments)    Uncontrolled shaking   Diprivan [Propofol] Other (See Comments)    Uncontrolled shaking   Fentanyl Other (See Comments)     Uncontrolled shaking   Sevoflurane Other (See Comments)    Uncontrolled shaking   Zofran [Ondansetron Hcl] Other (See Comments)    Uncontrolled shaking     Review of Systems:  No headache, visual changes, nausea, vomiting, diarrhea, constipation, dizziness, abdominal pain, skin rash, fevers, chills, night sweats, weight loss, swollen lymph nodes, body aches, joint swelling, chest pain, shortness of breath, mood changes. POSITIVE muscle aches  Objective  Blood pressure 128/78, pulse 63, height '5\' 6"'$  (1.676 m), weight 133 lb (60.3 kg), SpO2 97 %.   General: No apparent distress alert and oriented x3 mood and affect normal, dressed appropriately.  HEENT: Pupils equal, extraocular movements intact  Respiratory: Patient's speak in full sentences and does not appear short of breath  Cardiovascular: No lower extremity edema, non tender, no erythema  Scoliosis still noted.  Loss of lordosis of the back.  Tightness noted more on the right than the left with FABER test.  Patient has negative straight leg test but worsening pain with extension of the back.  Osteopathic findings  C2 flexed rotated and side bent right C6 flexed rotated and side bent left T4 extended rotated and side bent right inhaled rib L2 flexed rotated and side bent right Sacrum right on right       Assessment and Plan:  Degenerative lumbar spinal stenosis Chronic problem but seems to be relatively stable.  Does have mild exacerbation at this point.  Does have the muscle relaxer Zanaflex) we have  done anti-inflammatories.  Patient has been taking 102 100 mg of gabapentin at night as well.  Discussed with patient to continue to stay active.  Discussed which activities to do which wants to avoid.  Increase activity slowly.  Follow-up again in 6 to 8 weeks.   Nonallopathic problems  Decision today to treat with OMT was based on Physical Exam  After verbal consent patient was treated with HVLA, ME, FPR techniques in  cervical, rib, thoracic, lumbar, and sacral  areas  Patient tolerated the procedure well with improvement in symptoms  Patient given exercises, stretches and lifestyle modifications  See medications in patient instructions if given  Patient will follow up in 4-8 weeks      The above documentation has been reviewed and is accurate and complete Lyndal Pulley, DO       Note: This dictation was prepared with Dragon dictation along with smaller phrase technology. Any transcriptional errors that result from this process are unintentional.

## 2021-01-15 ENCOUNTER — Other Ambulatory Visit: Payer: Self-pay

## 2021-01-15 ENCOUNTER — Ambulatory Visit: Payer: PPO | Admitting: Family Medicine

## 2021-01-15 VITALS — BP 128/78 | HR 63 | Ht 66.0 in | Wt 133.0 lb

## 2021-01-15 DIAGNOSIS — M9908 Segmental and somatic dysfunction of rib cage: Secondary | ICD-10-CM

## 2021-01-15 DIAGNOSIS — M9901 Segmental and somatic dysfunction of cervical region: Secondary | ICD-10-CM | POA: Diagnosis not present

## 2021-01-15 DIAGNOSIS — M9902 Segmental and somatic dysfunction of thoracic region: Secondary | ICD-10-CM

## 2021-01-15 DIAGNOSIS — M9904 Segmental and somatic dysfunction of sacral region: Secondary | ICD-10-CM

## 2021-01-15 DIAGNOSIS — M9903 Segmental and somatic dysfunction of lumbar region: Secondary | ICD-10-CM

## 2021-01-15 DIAGNOSIS — M48061 Spinal stenosis, lumbar region without neurogenic claudication: Secondary | ICD-10-CM

## 2021-01-15 NOTE — Patient Instructions (Signed)
Good to see you! See you again in 6 weeks 

## 2021-01-15 NOTE — Assessment & Plan Note (Signed)
Chronic problem but seems to be relatively stable.  Does have mild exacerbation at this point.  Does have the muscle relaxer Zanaflex) we have done anti-inflammatories.  Patient has been taking 102 100 mg of gabapentin at night as well.  Discussed with patient to continue to stay active.  Discussed which activities to do which wants to avoid.  Increase activity slowly.  Follow-up again in 6 to 8 weeks.

## 2021-01-17 ENCOUNTER — Ambulatory Visit: Payer: PPO | Admitting: Dermatology

## 2021-02-25 DIAGNOSIS — Z1231 Encounter for screening mammogram for malignant neoplasm of breast: Secondary | ICD-10-CM | POA: Diagnosis not present

## 2021-02-25 DIAGNOSIS — Z7983 Long term (current) use of bisphosphonates: Secondary | ICD-10-CM | POA: Diagnosis not present

## 2021-02-25 DIAGNOSIS — N952 Postmenopausal atrophic vaginitis: Secondary | ICD-10-CM | POA: Diagnosis not present

## 2021-02-25 DIAGNOSIS — M8588 Other specified disorders of bone density and structure, other site: Secondary | ICD-10-CM | POA: Diagnosis not present

## 2021-02-25 DIAGNOSIS — N958 Other specified menopausal and perimenopausal disorders: Secondary | ICD-10-CM | POA: Diagnosis not present

## 2021-02-25 DIAGNOSIS — Z6821 Body mass index (BMI) 21.0-21.9, adult: Secondary | ICD-10-CM | POA: Diagnosis not present

## 2021-02-25 DIAGNOSIS — Z01419 Encounter for gynecological examination (general) (routine) without abnormal findings: Secondary | ICD-10-CM | POA: Diagnosis not present

## 2021-02-26 ENCOUNTER — Other Ambulatory Visit: Payer: Self-pay

## 2021-02-26 ENCOUNTER — Ambulatory Visit: Payer: PPO | Admitting: Sports Medicine

## 2021-02-26 VITALS — BP 122/80 | HR 78 | Ht 66.0 in | Wt 133.0 lb

## 2021-02-26 DIAGNOSIS — M9902 Segmental and somatic dysfunction of thoracic region: Secondary | ICD-10-CM | POA: Diagnosis not present

## 2021-02-26 DIAGNOSIS — M9905 Segmental and somatic dysfunction of pelvic region: Secondary | ICD-10-CM

## 2021-02-26 DIAGNOSIS — M9903 Segmental and somatic dysfunction of lumbar region: Secondary | ICD-10-CM

## 2021-02-26 DIAGNOSIS — M9908 Segmental and somatic dysfunction of rib cage: Secondary | ICD-10-CM

## 2021-02-26 DIAGNOSIS — M48061 Spinal stenosis, lumbar region without neurogenic claudication: Secondary | ICD-10-CM

## 2021-02-26 DIAGNOSIS — M9901 Segmental and somatic dysfunction of cervical region: Secondary | ICD-10-CM | POA: Diagnosis not present

## 2021-02-26 NOTE — Patient Instructions (Signed)
Good to see you  Follow up in 4-6 weeks for OMT

## 2021-02-26 NOTE — Progress Notes (Signed)
Gloria [name] D.[facility] [place] Phone: [phone]   Assessment and Plan:     1. Degenerative lumbar spinal stenosis 2. Somatic dysfunction of cervical region 3. Somatic dysfunction of thoracic region 4. Somatic dysfunction of lumbar region 5. Somatic dysfunction of pelvic region 6. Somatic dysfunction of rib region -Chronic with exacerbation, subsequent sports medicine visit - Multiple musculoskeletal complaints that are generally well controlled with OMT.  Patient elected for repeat OMT today    Decision today to treat with OMT was based on Physical Exam   After verbal consent patient was treated with HVLA (high velocity low amplitude), ME (muscle energy), FPR (flex positional release), ST (soft tissue), PC/PD (Pelvic Compression/ Pelvic Decompression) techniques in cervical, rib, thoracic, lumbar, and pelvic areas. Patient tolerated the procedure well with improvement in symptoms.  Patient educated on potential side effects of soreness and recommended to rest, hydrate, and use Tylenol as needed for pain control.   Pertinent previous records reviewed include previous sportsman note   Follow Up: In 4-8 weeks for repeat OMT   Subjective:   I, Judy Pimple, am serving as a scribe for Dr. Glennon Mac  Chief Complaint: neck and back pain   HPI:   02/26/21 Patient is a 75 year old female presenting with neck and back pain. Patient was last seen by Dr. [name] on 01/15/21 and had OMT. Today patient states that she is feeling well been able to mow the lawn and 10 mile bike riding. Does have some back tightness.   Relevant Historical Information: History of lumbar stenosis  Additional pertinent review of systems negative.  Current Outpatient Medications  Medication Sig Dispense Refill   alendronate (FOSAMAX) 10 MG tablet Take 10 mg by mouth daily before breakfast. Take with a full glass of water on an empty stomach.      amLODipine (NORVASC) 5 MG tablet Take 5 mg by mouth daily.     aspirin EC 81 MG tablet Take 81 mg by mouth daily. Swallow whole.     diclofenac sodium (VOLTAREN) 1 % GEL Apply 2 g topically 4 (four) times daily. (Patient taking differently: Apply 2 g topically 2 (two) times daily as needed (pain).) 100 g 1   estradiol (ESTRACE) 0.1 MG/GM vaginal cream Place 1 Applicatorful vaginally 2 (two) times a week.     gabapentin (NEURONTIN) 100 MG capsule TAKE 2 CAPSULES BY MOUTH AT BEDTIME (Patient taking differently: Take 100-200 mg by mouth at bedtime.) 180 capsule 1   hydrochlorothiazide (HYDRODIURIL) 25 MG tablet Take 25 mg by mouth daily.     hydroxychloroquine (PLAQUENIL) 200 MG tablet Take 400 mg by mouth daily.      losartan (COZAAR) 50 MG tablet 1 tablet     Multiple Vitamin (MULTIVITAMIN WITH MINERALS) TABS tablet Take 1 tablet by mouth daily.     Multiple Vitamins-Minerals (PRESERVISION AREDS 2) CAPS Take 1 capsule by mouth 2 (two) times daily.     OVER THE COUNTER MEDICATION 81 mg daily. Asprin     OVER THE COUNTER MEDICATION 1 tablet daily. [employer] Adreds     potassium chloride (K-DUR,KLOR-CON) 10 MEQ tablet Take 10 mEq by mouth daily.     tiZANidine (ZANAFLEX) 4 MG tablet Take 8 mg by mouth at bedtime.     traZODone (DESYREL) 50 MG tablet Take 50-150 mg by mouth at bedtime.      tretinoin (ALTRALIN) 0.05 % gel Apply 1 application topically at bedtime. [place]  g 2   TURMERIC PO Take 1 capsule by mouth daily.      valsartan (DIOVAN) 80 MG tablet Take 80 mg by mouth daily.     Vitamin D, Ergocalciferol, (DRISDOL) 1.25 MG (50000 UNIT) CAPS capsule TAKE 1 CAPSULE BY MOUTH ONE TIME PER WEEK 12 capsule 0   potassium chloride (KLOR-CON) 10 MEQ tablet Take 1 tablet (10 mEq total) by mouth daily for 7 days. 7 tablet 0   No current facility-administered medications for this visit.      Objective:     Vitals:   02/26/21 1030  BP: 122/80  Pulse: 78  SpO2: 98%  Weight: 133 lb (60.3 kg)   Height: 5\' 6"  (3.833 m)      Body mass index is 21.47 kg/m.    Physical Exam:     General: Well-appearing, cooperative, sitting comfortably in no acute distress.   OMT Physical Exam:  ASIS Compression Test: Positive Right Cervical: Mild TTP paraspinal, C3 RLSL Rib: Bilateral elevated first rib with NTTP Thoracic: Mild TTP paraspinal, T4-8 RLSR Lumbar: Mild TTP paraspinal, L2-4 RRSL Pelvis: Right anterior innominate without flare  Electronically signed by:  Gloria [name] D.[facility] 10:53 AM 02/26/21

## 2021-03-11 DIAGNOSIS — Z79899 Other long term (current) drug therapy: Secondary | ICD-10-CM | POA: Diagnosis not present

## 2021-03-11 DIAGNOSIS — H43813 Vitreous degeneration, bilateral: Secondary | ICD-10-CM | POA: Diagnosis not present

## 2021-03-11 DIAGNOSIS — H40013 Open angle with borderline findings, low risk, bilateral: Secondary | ICD-10-CM | POA: Diagnosis not present

## 2021-03-11 DIAGNOSIS — H353131 Nonexudative age-related macular degeneration, bilateral, early dry stage: Secondary | ICD-10-CM | POA: Diagnosis not present

## 2021-04-04 DIAGNOSIS — M150 Primary generalized (osteo)arthritis: Secondary | ICD-10-CM | POA: Diagnosis not present

## 2021-04-04 DIAGNOSIS — Z79899 Other long term (current) drug therapy: Secondary | ICD-10-CM | POA: Diagnosis not present

## 2021-04-04 DIAGNOSIS — M2550 Pain in unspecified joint: Secondary | ICD-10-CM | POA: Diagnosis not present

## 2021-04-04 DIAGNOSIS — Z6821 Body mass index (BMI) 21.0-21.9, adult: Secondary | ICD-10-CM | POA: Diagnosis not present

## 2021-04-04 DIAGNOSIS — M0609 Rheumatoid arthritis without rheumatoid factor, multiple sites: Secondary | ICD-10-CM | POA: Diagnosis not present

## 2021-04-11 NOTE — Progress Notes (Signed)
Gloria [name] [address] [place] Phone: [phone] Subjective:   IVilma Meckel, am serving as a scribe for Dr. Hulan Saas. This visit occurred during the SARS-CoV-2 public health emergency.  Safety protocols were in place, including screening questions prior to the visit, additional usage of staff PPE, and extensive cleaning of exam room while observing appropriate contact time as indicated for disinfecting solutions.   I'm seeing this patient by the request  of:  Gloria Smoker, MD  CC: Low back and neck pain follow-up  [id number]  Gloria [name] is a 75 y.o. female coming in with complaint of back and neck pain. OMT 01/15/2021. Patient states shoulder is a bit achy. No other complaints  Medications patient has been prescribed: Gabapentin, Zanaflex  Taking: Intermittently intermittently          Past Medical History:  Diagnosis Date   Anxiety    Arthritis    CKD (chronic kidney disease), stage III ([place])    Complication of anesthesia 05/2014   had body shaking-observed over night-see anesthesia note-has had tremors post op in past   Depression    Hyperlipemia    Hypertension    Insomnia     Allergies  Allergen Reactions   Versed [Midazolam] Other (See Comments)    Uncontrolled shaking   Diprivan [Propofol] Other (See Comments)    Uncontrolled shaking   Fentanyl Other (See Comments)    Uncontrolled shaking   Sevoflurane Other (See Comments)    Uncontrolled shaking   Zofran [Ondansetron Hcl] Other (See Comments)    Uncontrolled shaking     Review of Systems:  No headache, visual changes, nausea, vomiting, diarrhea, constipation, dizziness, abdominal pain, skin rash, fevers, chills, night sweats, weight loss, swollen lymph nodes, body aches, joint swelling, chest pain, shortness of breath, mood changes. POSITIVE muscle aches  Objective  Blood pressure 124/76, pulse 87, height 5\' 6"  (1.676 m), weight 134  lb (60.8 kg), SpO2 97 %.   General: No apparent distress alert and oriented x3 mood and affect normal, dressed appropriately.  HEENT: Pupils equal, extraocular movements intact  Respiratory: Patient's speak in full sentences and does not appear short of breath  Cardiovascular: No lower extremity edema, non tender, no erythema  Gait normal with good balance and coordination.  MSK:  Non tender with full range of motion and good stability and symmetric strength and tone of shoulders, elbows, wrist, hip, knee and ankles bilaterally.  Back -degenerative scoliosis noted.  Osteopathic findings  C3 flexed rotated and side bent right C6 flexed rotated and side bent left T4-8 neutral rotated left side bent right inhaled fourth rib L2 through 4 neutral rotated right side bent left Sacrum right on right Anterior ilium right      Assessment and Plan:  Degenerative lumbar spinal stenosis Known spinal stenosis.  Doing relatively well with conservative therapy.  Exacerbation patient does take the gabapentin.  Discussed this.  Patient is also being treated for osteoporosis that could be contributing to some of her aches and pains as well as the osteoarthritic changes.  No other changes in medication at this time.  Continue vitamin D supplementation.  Follow-up again in 5 to 6 weeks   Nonallopathic problems  Decision today to treat with OMT was based on Physical Exam  After verbal consent patient was treated with HVLA, ME, FPR techniques in cervical, rib, thoracic, lumbar, and sacral  areas  Patient tolerated the procedure well with improvement in  symptoms  Patient given exercises, stretches and lifestyle modifications  See medications in patient instructions if given  Patient will follow up in 4-8 weeks      The above documentation has been reviewed and is accurate and complete Lyndal Pulley, DO        Note: This dictation was prepared with Dragon dictation along with smaller  phrase technology. Any transcriptional errors that result from this process are unintentional.

## 2021-04-12 DIAGNOSIS — N9089 Other specified noninflammatory disorders of vulva and perineum: Secondary | ICD-10-CM | POA: Diagnosis not present

## 2021-04-12 DIAGNOSIS — N907 Vulvar cyst: Secondary | ICD-10-CM | POA: Diagnosis not present

## 2021-04-16 ENCOUNTER — Ambulatory Visit: Payer: PPO | Admitting: Family Medicine

## 2021-04-16 ENCOUNTER — Other Ambulatory Visit: Payer: Self-pay

## 2021-04-16 VITALS — BP 124/76 | HR 87 | Ht 66.0 in | Wt 134.0 lb

## 2021-04-16 DIAGNOSIS — M9901 Segmental and somatic dysfunction of cervical region: Secondary | ICD-10-CM | POA: Diagnosis not present

## 2021-04-16 DIAGNOSIS — M9904 Segmental and somatic dysfunction of sacral region: Secondary | ICD-10-CM

## 2021-04-16 DIAGNOSIS — M9902 Segmental and somatic dysfunction of thoracic region: Secondary | ICD-10-CM

## 2021-04-16 DIAGNOSIS — M9908 Segmental and somatic dysfunction of rib cage: Secondary | ICD-10-CM | POA: Diagnosis not present

## 2021-04-16 DIAGNOSIS — M48061 Spinal stenosis, lumbar region without neurogenic claudication: Secondary | ICD-10-CM

## 2021-04-16 DIAGNOSIS — M9903 Segmental and somatic dysfunction of lumbar region: Secondary | ICD-10-CM

## 2021-04-16 NOTE — Assessment & Plan Note (Signed)
Known spinal stenosis.  Doing relatively well with conservative therapy.  Exacerbation patient does take the gabapentin.  Discussed this.  Patient is also being treated for osteoporosis that could be contributing to some of her aches and pains as well as the osteoarthritic changes.  No other changes in medication at this time.  Continue vitamin D supplementation.  Follow-up again in 5 to 6 weeks

## 2021-04-16 NOTE — Patient Instructions (Signed)
Good to see you! Try to be active when weather allows Happy Holidays See you again in 5-6 weeks

## 2021-05-08 DIAGNOSIS — R7989 Other specified abnormal findings of blood chemistry: Secondary | ICD-10-CM | POA: Diagnosis not present

## 2021-05-20 DIAGNOSIS — M810 Age-related osteoporosis without current pathological fracture: Secondary | ICD-10-CM | POA: Diagnosis not present

## 2021-05-20 DIAGNOSIS — M069 Rheumatoid arthritis, unspecified: Secondary | ICD-10-CM | POA: Diagnosis not present

## 2021-05-20 DIAGNOSIS — G43009 Migraine without aura, not intractable, without status migrainosus: Secondary | ICD-10-CM | POA: Diagnosis not present

## 2021-05-20 DIAGNOSIS — E7800 Pure hypercholesterolemia, unspecified: Secondary | ICD-10-CM | POA: Diagnosis not present

## 2021-05-20 DIAGNOSIS — N1831 Chronic kidney disease, stage 3a: Secondary | ICD-10-CM | POA: Diagnosis not present

## 2021-05-20 DIAGNOSIS — F325 Major depressive disorder, single episode, in full remission: Secondary | ICD-10-CM | POA: Diagnosis not present

## 2021-05-20 DIAGNOSIS — I10 Essential (primary) hypertension: Secondary | ICD-10-CM | POA: Diagnosis not present

## 2021-05-20 NOTE — Progress Notes (Signed)
Zach Sani[place] [place] [address] [place] Phone: [phone] Subjective:   Gloria [name], am serving as a scribe for Dr. Hulan Saas. This visit occurred during the SARS-CoV-2 public health emergency.  Safety protocols were in place, including screening questions prior to the visit, additional usage of staff PPE, and extensive cleaning of exam room while observing appropriate contact time as indicated for disinfecting solutions.   I'm seeing this patient by the request  of:  Glenis Smoker, MD  CC: Neck and back pain follow-up  [id number]  Gloria [name] is a 76 y.o. female coming in with complaint of back and neck pain. OMT 04/16/2021. Patient states back is bothering her on lower right side. No new complaints.  Medications patient has been prescribed: None  Taking:         Reviewed prior external information including notes and imaging from previsou exam, outside providers and external EMR if available.   As well as notes that were available from care everywhere and other healthcare systems.  Past medical history, social, surgical and family history all reviewed in electronic medical record.  No pertanent information unless stated regarding to the chief complaint.   Past Medical History:  Diagnosis Date   Anxiety    Arthritis    CKD (chronic kidney disease), stage III ([place])    Complication of anesthesia 05/2014   had body shaking-observed over night-see anesthesia note-has had tremors post op in past   Depression    Hyperlipemia    Hypertension    Insomnia     Allergies  Allergen Reactions   Versed [Midazolam] Other (See Comments)    Uncontrolled shaking   Diprivan [Propofol] Other (See Comments)    Uncontrolled shaking   Fentanyl Other (See Comments)    Uncontrolled shaking   Sevoflurane Other (See Comments)    Uncontrolled shaking   Zofran [Ondansetron Hcl] Other (See Comments)    Uncontrolled shaking      Review of Systems:  No headache, visual changes, nausea, vomiting, diarrhea, constipation, dizziness, abdominal pain, skin rash, fevers, chills, night sweats, weight loss, swollen lymph nodes, body aches, joint swelling, chest pain, shortness of breath, mood changes. POSITIVE muscle aches  Objective  Blood pressure 132/62, height 5\' 6"  (1.676 m), weight 132 lb (59.9 kg).   General: No apparent distress alert and oriented x3 mood and affect normal, dressed appropriately.  HEENT: Pupils equal, extraocular movements intact  Respiratory: Patient's speak in full sentences and does not appear short of breath  Cardiovascular: No lower extremity edema, non tender, no erythema  Scoliosis noted.  Patient's right hip though does have significant increase in tightness.  Patient does have tenderness around the right sacroiliac joint as well.  Arthritic changes of multiple joints  Osteopathic findings  C3 flexed rotated and side bent right T3 extended rotated and side bent right inhaled rib T9 extended rotated and side bent left L2 flexed rotated and side bent right L5 flexed rotated and side bent left Sacrum right on right       Assessment and Plan:  Degenerative lumbar spinal stenosis Known degenerative lumbar spinal stenosis with scoliosis.  Discussed icing regimen and home exercise, discussed which activities to do which wants to avoid.  Increase activity slowly.  Discussed icing regimen.  Follow-up with me again in 6 to 8 weeks. Chronic problem with exacerbation continue on the gabapentin.  Nonallopathic problems  Decision today to treat with OMT was based on Physical Exam  After verbal consent patient was treated with HVLA, ME, FPR techniques in cervical, rib, thoracic, lumbar, and sacral  areas avoided HVLA on the cervical spine  Patient tolerated the procedure well with improvement in symptoms  Patient given exercises, stretches and lifestyle modifications  See medications  in patient instructions if given  Patient will follow up in 4-8 weeks      The above documentation has been reviewed and is accurate and complete Lyndal Pulley, DO       Note: This dictation was prepared with Dragon dictation along with smaller phrase technology. Any transcriptional errors that result from this process are unintentional.

## 2021-05-21 ENCOUNTER — Other Ambulatory Visit: Payer: Self-pay

## 2021-05-21 ENCOUNTER — Ambulatory Visit: Payer: PPO | Admitting: Family Medicine

## 2021-05-21 VITALS — BP 132/62 | Ht 66.0 in | Wt 132.0 lb

## 2021-05-21 DIAGNOSIS — M9902 Segmental and somatic dysfunction of thoracic region: Secondary | ICD-10-CM

## 2021-05-21 DIAGNOSIS — M9901 Segmental and somatic dysfunction of cervical region: Secondary | ICD-10-CM | POA: Diagnosis not present

## 2021-05-21 DIAGNOSIS — M48061 Spinal stenosis, lumbar region without neurogenic claudication: Secondary | ICD-10-CM

## 2021-05-21 DIAGNOSIS — M9908 Segmental and somatic dysfunction of rib cage: Secondary | ICD-10-CM | POA: Diagnosis not present

## 2021-05-21 DIAGNOSIS — M9904 Segmental and somatic dysfunction of sacral region: Secondary | ICD-10-CM | POA: Diagnosis not present

## 2021-05-21 DIAGNOSIS — M9903 Segmental and somatic dysfunction of lumbar region: Secondary | ICD-10-CM

## 2021-05-21 NOTE — Patient Instructions (Signed)
Good to see you Happy New Year Careful on couches See me in 5 weeks

## 2021-05-21 NOTE — Assessment & Plan Note (Signed)
Known degenerative lumbar spinal stenosis with scoliosis.  Discussed icing regimen and home exercise, discussed which activities to do which wants to avoid.  Increase activity slowly.  Discussed icing regimen.  Follow-up with me again in 6 to 8 weeks.

## 2021-06-17 DIAGNOSIS — I10 Essential (primary) hypertension: Secondary | ICD-10-CM | POA: Diagnosis not present

## 2021-06-17 DIAGNOSIS — N1831 Chronic kidney disease, stage 3a: Secondary | ICD-10-CM | POA: Diagnosis not present

## 2021-06-17 DIAGNOSIS — E7800 Pure hypercholesterolemia, unspecified: Secondary | ICD-10-CM | POA: Diagnosis not present

## 2021-06-20 NOTE — Progress Notes (Signed)
Zach Magu[place] Gloria [name] [address] [place] Phone: [phone] Subjective:   IVilma Meckel, am serving as a scribe for Dr. Hulan Saas. This visit occurred during the SARS-CoV-2 public health emergency.  Safety protocols were in place, including screening questions prior to the visit, additional usage of staff PPE, and extensive cleaning of exam room while observing appropriate contact time as indicated for disinfecting solutions.   I'm seeing this patient by the request  of:  Glenis Smoker, MD  CC: neck and back pain follow up    [id number]  Parrie L Brigandi is a 77 y.o. female coming in with complaint of back and neck pain. OMT 05/21/2021. Patient states that her back has been doing well. Has neuropathy in feet. Just came from PCP who diagnosed her with this condition.    Medications patient has been prescribed: None  Taking:         Reviewed prior external information including notes and imaging from previsou exam, outside providers and external EMR if available.   As well as notes that were available from care everywhere and other healthcare systems.  Past medical history, social, surgical and family history all reviewed in electronic medical record.  No pertanent information unless stated regarding to the chief complaint.   Past Medical History:  Diagnosis Date   Anxiety    Arthritis    CKD (chronic kidney disease), stage III ([place])    Complication of anesthesia 05/2014   had body shaking-observed over night-see anesthesia note-has had tremors post op in past   Depression    Hyperlipemia    Hypertension    Insomnia     Allergies  Allergen Reactions   Versed [Midazolam] Other (See Comments)    Uncontrolled shaking   Diprivan [Propofol] Other (See Comments)    Uncontrolled shaking   Fentanyl Other (See Comments)    Uncontrolled shaking   Sevoflurane Other (See Comments)    Uncontrolled shaking   Zofran  [Ondansetron Hcl] Other (See Comments)    Uncontrolled shaking     Review of Systems:  No headache, visual changes, nausea, vomiting, diarrhea, constipation, dizziness, abdominal pain, skin rash, fevers, chills, night sweats, weight loss, swollen lymph nodes, body aches, joint swelling, chest pain, shortness of breath, mood changes. POSITIVE muscle aches  Objective  Blood pressure (!) 144/82, pulse 75, height 5\' 6"  (1.676 m), weight 132 lb (59.9 kg), SpO2 99 %.   General: No apparent distress alert and oriented x3 mood and affect normal, dressed appropriately.  HEENT: Pupils equal, extraocular movements intact  Respiratory: Patient's speak in full sentences and does not appear short of breath  Cardiovascular: No lower extremity edema, non tender, no erythema    Osteopathic findings  C2 flexed rotated and side bent right C6 flexed rotated and side bent left T3-7 neutral rotated left and side bent right inhaled rib L1 flexed rotated and side bent right Sacrum right on right       Assessment and Plan:  Degenerative lumbar spinal stenosis TTP discussed HEP, discussed which activities to do and which ones to avoid.  Discussed meds with zanaflex  F/u 6 weeks    Nonallopathic problems  Decision today to treat with OMT was based on Physical Exam  After verbal consent patient was treated with HVLA, ME, FPR techniques in cervical, rib, thoracic, lumbar, and sacral  areas  Patient tolerated the procedure well with improvement in symptoms  Patient given exercises, stretches and lifestyle modifications  See medications in patient instructions if given  Patient will follow up in 4-8 weeks      The above documentation has been reviewed and is accurate and complete Lyndal Pulley, DO        Note: This dictation was prepared with Dragon dictation along with smaller phrase technology. Any transcriptional errors that result from this process are unintentional.

## 2021-06-25 ENCOUNTER — Ambulatory Visit: Payer: PPO | Admitting: Family Medicine

## 2021-06-25 ENCOUNTER — Other Ambulatory Visit: Payer: Self-pay

## 2021-06-25 VITALS — BP 144/82 | HR 75 | Ht 66.0 in | Wt 132.0 lb

## 2021-06-25 DIAGNOSIS — M9901 Segmental and somatic dysfunction of cervical region: Secondary | ICD-10-CM | POA: Diagnosis not present

## 2021-06-25 DIAGNOSIS — R202 Paresthesia of skin: Secondary | ICD-10-CM | POA: Diagnosis not present

## 2021-06-25 DIAGNOSIS — M9902 Segmental and somatic dysfunction of thoracic region: Secondary | ICD-10-CM

## 2021-06-25 DIAGNOSIS — M48061 Spinal stenosis, lumbar region without neurogenic claudication: Secondary | ICD-10-CM

## 2021-06-25 DIAGNOSIS — E7800 Pure hypercholesterolemia, unspecified: Secondary | ICD-10-CM | POA: Diagnosis not present

## 2021-06-25 DIAGNOSIS — M9903 Segmental and somatic dysfunction of lumbar region: Secondary | ICD-10-CM | POA: Diagnosis not present

## 2021-06-25 DIAGNOSIS — I10 Essential (primary) hypertension: Secondary | ICD-10-CM | POA: Diagnosis not present

## 2021-06-25 DIAGNOSIS — M9904 Segmental and somatic dysfunction of sacral region: Secondary | ICD-10-CM

## 2021-06-25 DIAGNOSIS — M9908 Segmental and somatic dysfunction of rib cage: Secondary | ICD-10-CM | POA: Diagnosis not present

## 2021-06-25 NOTE — Patient Instructions (Signed)
Overall not too bad See me in 6 weeks

## 2021-06-25 NOTE — Assessment & Plan Note (Signed)
TTP discussed HEP, discussed which activities to do and which ones to avoid.  Discussed meds with zanaflex  F/u 6 weeks

## 2021-07-25 DIAGNOSIS — Z03818 Encounter for observation for suspected exposure to other biological agents ruled out: Secondary | ICD-10-CM | POA: Diagnosis not present

## 2021-07-25 DIAGNOSIS — R051 Acute cough: Secondary | ICD-10-CM | POA: Diagnosis not present

## 2021-07-25 DIAGNOSIS — J189 Pneumonia, unspecified organism: Secondary | ICD-10-CM | POA: Diagnosis not present

## 2021-08-01 NOTE — Progress Notes (Deleted)
?Charlann Boxer D.O. ?[facility] ?[place] ?Phone: [phone] ?Subjective:   ? ?I'm seeing this patient by the request  of:  Glenis Smoker, MD ? ?CC:  ? ?[id number]  ?Gloria [name] is a 76 y.o. female coming in with complaint of back and neck pain. OMT 06/25/2021. Patient states  ? ?Medications patient has been prescribed: none ? ?Taking: ? ? ?  ? ? ? ? ?Reviewed prior external information including notes and imaging from previsou exam, outside providers and external EMR if available.  ? ?As well as notes that were available from care everywhere and other healthcare systems. ? ?Past medical history, social, surgical and family history all reviewed in electronic medical record.  No pertanent information unless stated regarding to the chief complaint.  ? ?Past Medical History:  ?Diagnosis Date  ? Anxiety   ? Arthritis   ? CKD (chronic kidney disease), stage III ([place])   ? Complication of anesthesia 05/2014  ? had body shaking-observed over night-see anesthesia note-has had tremors post op in past  ? Depression   ? Hyperlipemia   ? Hypertension   ? Insomnia   ?  ?Allergies  ?Allergen Reactions  ? Versed [Midazolam] Other (See Comments)  ?  Uncontrolled shaking  ? Diprivan [Propofol] Other (See Comments)  ?  Uncontrolled shaking  ? Fentanyl Other (See Comments)  ?  Uncontrolled shaking  ? Sevoflurane Other (See Comments)  ?  Uncontrolled shaking  ? Zofran [Ondansetron Hcl] Other (See Comments)  ?  Uncontrolled shaking  ? ? ? ?Review of Systems: ? No headache, visual changes, nausea, vomiting, diarrhea, constipation, dizziness, abdominal pain, skin rash, fevers, chills, night sweats, weight loss, swollen lymph nodes, body aches, joint swelling, chest pain, shortness of breath, mood changes. POSITIVE muscle aches ? ?Objective  ?There were no vitals taken for this visit. ?  ?General: No apparent distress alert and oriented x3 mood and affect normal, dressed appropriately.   ?HEENT: Pupils equal, extraocular movements intact  ?Respiratory: Patient's speak in full sentences and does not appear short of breath  ?Cardiovascular: No lower extremity edema, non tender, no erythema  ?Neuro: Cranial nerves II through XII are intact, neurovascularly intact in all extremities with 2+ DTRs and 2+ pulses.  ?Gait normal with good balance and coordination.  ?MSK:  Non tender with full range of motion and good stability and symmetric strength and tone of shoulders, elbows, wrist, hip, knee and ankles bilaterally.  ?Back - Normal skin, Spine with normal alignment and no deformity.  No tenderness to vertebral process palpation.  Paraspinous muscles are not tender and without spasm.   Range of motion is full at neck and lumbar sacral regions ? ?Osteopathic findings ? ?C2 flexed rotated and side bent right ?C6 flexed rotated and side bent left ?T3 extended rotated and side bent right inhaled rib ?T9 extended rotated and side bent left ?L2 flexed rotated and side bent right ?Sacrum right on right ? ? ? ? ?  ?Assessment and Plan: ? ? ? ?Nonallopathic problems ? ?Decision today to treat with OMT was based on Physical Exam ? ?After verbal consent patient was treated with HVLA, ME, FPR techniques in cervical, rib, thoracic, lumbar, and sacral  areas ? ?Patient tolerated the procedure well with improvement in symptoms ? ?Patient given exercises, stretches and lifestyle modifications ? ?See medications in patient instructions if given ? ?Patient will follow up in 4-8 weeks ? ?  ? ? ?The above documentation  has been reviewed and is accurate and complete Jacqualin Combes ? ? ?  ? ? Note: This dictation was prepared with Dragon dictation along with smaller phrase technology. Any transcriptional errors that result from this process are unintentional.    ?  ?  ? ?

## 2021-08-06 ENCOUNTER — Ambulatory Visit: Payer: PPO | Admitting: Family Medicine

## 2021-08-06 DIAGNOSIS — R051 Acute cough: Secondary | ICD-10-CM | POA: Diagnosis not present

## 2021-08-06 NOTE — Progress Notes (Signed)
?Charlann Boxer D.O. ?[facility] ?[place] ?Phone: [phone] ?Subjective:   ?I, Jacqualin Combes, am serving as a scribe for Dr. Hulan Saas. ? ?This visit occurred during the SARS-CoV-2 public health emergency.  Safety protocols were in place, including screening questions prior to the visit, additional usage of staff PPE, and extensive cleaning of exam room while observing appropriate contact time as indicated for disinfecting solutions.  ? ?I'm seeing this patient by the request  of:  Glenis Smoker, MD ? ?CC: Neck and back pain follow-up ? ?[id number]  ?Gloria [name] is a 76 y.o. female coming in with complaint of back and neck pain. OMT 06/25/2021. Patient states she was diagnosed with double pneumonia 2 weeks ago. Slept in recliner. Back pain has improved since then though. h ? ?Medications patient has been prescribed: None ? ? ?  ? ? ? ? ?Reviewed prior external information including notes and imaging from previsou exam, outside providers and external EMR if available.  ? ?As well as notes that were available from care everywhere and other healthcare systems. ? ?Past medical history, social, surgical and family history all reviewed in electronic medical record.  No pertanent information unless stated regarding to the chief complaint.  ? ?Past Medical History:  ?Diagnosis Date  ? Anxiety   ? Arthritis   ? CKD (chronic kidney disease), stage III ([place])   ? Complication of anesthesia 05/2014  ? had body shaking-observed over night-see anesthesia note-has had tremors post op in past  ? Depression   ? Hyperlipemia   ? Hypertension   ? Insomnia   ?  ?Allergies  ?Allergen Reactions  ? Versed [Midazolam] Other (See Comments)  ?  Uncontrolled shaking  ? Diprivan [Propofol] Other (See Comments)  ?  Uncontrolled shaking  ? Fentanyl Other (See Comments)  ?  Uncontrolled shaking  ? Sevoflurane Other (See Comments)  ?  Uncontrolled shaking  ? Zofran [Ondansetron Hcl] Other  (See Comments)  ?  Uncontrolled shaking  ? ? ? ?Review of Systems: ? No headache, visual changes, nausea, vomiting, diarrhea, constipation, dizziness, abdominal pain, skin rash, fevers, chills, night sweats, weight loss, swollen lymph nodes, body aches, joint swelling, chest pain, shortness of breath, mood changes. POSITIVE muscle aches ? ?Objective  ?Blood pressure 122/76, pulse 89, height '5\' 6"'$  (1.676 m), weight 129 lb (58.5 kg), SpO2 97 %. ?  ?General: No apparent distress alert and oriented x3 mood and affect normal, dressed appropriately.  ?HEENT: Pupils equal, extraocular movements intact  ?Respiratory: Patient's speak in full sentences and does not appear short of breath  ?Cardiovascular: No lower extremity edema, non tender, no erythema  ?Neck exam does have some loss of lordosis.  Patient does have tenderness to palpation with sidebending bilaterally.  Patient does have mild crepitus noted.  Increasing tightness noted in the parascapular regions.  Patient does still have the underlying significant scoliosis noted.  Mild increase in tightness of the low back as well. ? ?Osteopathic findings ? ?C2 flexed rotated and side bent right ?C6 flexed rotated and side bent left ?T3 extended rotated and side bent right inhaled rib ?T9 extended rotated and side bent left ?L2 flexed rotated and side bent right ?L5 flexed rotated and side bent right ?Sacrum right on right ? ? ? ? ?  ?Assessment and Plan: ? ?Degenerative lumbar spinal stenosis ?Chronic problem with exacerbation.  Increasing tightness noted today.  Do feel that this is secondary to patient being not quite  as active secondary to her recent illness.  Discussed with patient icing regimen and home exercise, which activities to do which ones to avoid, increase activity slowly.  Follow-up with me again in 6 to 8 weeks  ? ?Nonallopathic problems ? ?Decision today to treat with OMT was based on Physical Exam ? ?After verbal consent patient was treated with HVLA,  ME, FPR techniques in cervical, rib, thoracic, lumbar, and sacral  areas ? ?Patient tolerated the procedure well with improvement in symptoms ? ?Patient given exercises, stretches and lifestyle modifications ? ?See medications in patient instructions if given ? ?Patient will follow up in 4-8 weeks ? ?  ? ? ?The above documentation has been reviewed and is accurate and complete Lyndal Pulley, DO ? ? ? ?  ? ? Note: This dictation was prepared with Dragon dictation along with smaller phrase technology. Any transcriptional errors that result from this process are unintentional.    ?  ?  ? ?

## 2021-08-07 ENCOUNTER — Ambulatory Visit: Payer: PPO | Admitting: Family Medicine

## 2021-08-07 VITALS — BP 122/76 | HR 89 | Ht 66.0 in | Wt 129.0 lb

## 2021-08-07 DIAGNOSIS — M48061 Spinal stenosis, lumbar region without neurogenic claudication: Secondary | ICD-10-CM | POA: Diagnosis not present

## 2021-08-07 DIAGNOSIS — M9901 Segmental and somatic dysfunction of cervical region: Secondary | ICD-10-CM | POA: Diagnosis not present

## 2021-08-07 DIAGNOSIS — M9904 Segmental and somatic dysfunction of sacral region: Secondary | ICD-10-CM

## 2021-08-07 DIAGNOSIS — M9902 Segmental and somatic dysfunction of thoracic region: Secondary | ICD-10-CM

## 2021-08-07 DIAGNOSIS — M9908 Segmental and somatic dysfunction of rib cage: Secondary | ICD-10-CM

## 2021-08-07 DIAGNOSIS — M9903 Segmental and somatic dysfunction of lumbar region: Secondary | ICD-10-CM | POA: Diagnosis not present

## 2021-08-07 NOTE — Assessment & Plan Note (Signed)
Chronic problem with exacerbation.  Increasing tightness noted today.  Do feel that this is secondary to patient being not quite as active secondary to her recent illness.  Discussed with patient icing regimen and home exercise, which activities to do which ones to avoid, increase activity slowly.  Follow-up with me again in 6 to 8 weeks ?

## 2021-08-07 NOTE — Patient Instructions (Signed)
Good to see you ?Happy Gloria [name] ?See me in 6-8 weeks ?

## 2021-08-13 DIAGNOSIS — Z79899 Other long term (current) drug therapy: Secondary | ICD-10-CM | POA: Diagnosis not present

## 2021-08-13 DIAGNOSIS — M0520 Rheumatoid vasculitis with rheumatoid arthritis of unspecified site: Secondary | ICD-10-CM | POA: Diagnosis not present

## 2021-08-13 DIAGNOSIS — H43813 Vitreous degeneration, bilateral: Secondary | ICD-10-CM | POA: Diagnosis not present

## 2021-08-13 DIAGNOSIS — Z961 Presence of intraocular lens: Secondary | ICD-10-CM | POA: Diagnosis not present

## 2021-08-13 DIAGNOSIS — H353132 Nonexudative age-related macular degeneration, bilateral, intermediate dry stage: Secondary | ICD-10-CM | POA: Diagnosis not present

## 2021-09-16 DIAGNOSIS — H40013 Open angle with borderline findings, low risk, bilateral: Secondary | ICD-10-CM | POA: Diagnosis not present

## 2021-09-16 DIAGNOSIS — H43813 Vitreous degeneration, bilateral: Secondary | ICD-10-CM | POA: Diagnosis not present

## 2021-09-16 DIAGNOSIS — H353131 Nonexudative age-related macular degeneration, bilateral, early dry stage: Secondary | ICD-10-CM | POA: Diagnosis not present

## 2021-09-16 DIAGNOSIS — Z79899 Other long term (current) drug therapy: Secondary | ICD-10-CM | POA: Diagnosis not present

## 2021-09-18 ENCOUNTER — Ambulatory Visit: Payer: PPO | Admitting: Family Medicine

## 2021-09-23 NOTE — Progress Notes (Unsigned)
Gloria [name] [place] [address] [place] Phone: [phone] Subjective:   Gloria [name], am serving as a scribe for Dr. Hulan Saas. This visit occurred during the SARS-CoV-2 public health emergency.  Safety protocols were in place, including screening questions prior to the visit, additional usage of staff PPE, and extensive cleaning of exam room while observing appropriate contact time as indicated for disinfecting solutions.   I'm seeing this patient by the request  of:  Glenis Smoker, MD  CC: Neck pain and neck pain follow-up  [id number]  Gloria [name] is a 76 y.o. female coming in with complaint of back and neck pain. OM 08/07/2021. Patient states same per usual. Neck and shoulder have been bothersome. Mostly on the right. No new complaints.  Has been able to be relatively active.  Medications patient has been prescribed: None  Taking:         Reviewed prior external information including notes and imaging from previsou exam, outside providers and external EMR if available.   As well as notes that were available from care everywhere and other healthcare systems.  Past medical history, social, surgical and family history all reviewed in electronic medical record.  No pertanent information unless stated regarding to the chief complaint.   Past Medical History:  Diagnosis Date   Anxiety    Arthritis    CKD (chronic kidney disease), stage III ([place])    Complication of anesthesia 05/2014   had body shaking-observed over night-see anesthesia note-has had tremors post op in past   Depression    Hyperlipemia    Hypertension    Insomnia     Allergies  Allergen Reactions   Versed [Midazolam] Other (See Comments)    Uncontrolled shaking   Diprivan [Propofol] Other (See Comments)    Uncontrolled shaking   Fentanyl Other (See Comments)    Uncontrolled shaking   Sevoflurane Other (See Comments)    Uncontrolled shaking    Zofran [Ondansetron Hcl] Other (See Comments)    Uncontrolled shaking     Review of Systems:  No headache, visual changes, nausea, vomiting, diarrhea, constipation, dizziness, abdominal pain, skin rash, fevers, chills, night sweats, weight loss, swollen lymph nodes, body aches, joint swelling, chest pain, shortness of breath, mood changes. POSITIVE muscle aches  Objective  Blood pressure 118/66, pulse 87, height '5\' 6"'$  (1.676 m), weight 128 lb (58.1 kg), SpO2 98 %.   General: No apparent distress alert and oriented x3 mood and affect normal, dressed appropriately.  HEENT: Pupils equal, extraocular movements intact  Respiratory: Patient's speak in full sentences and does not appear short of breath  Cardiovascular: No lower extremity edema, non tender, no erythema  Low back exam does have some loss of lordosis.  Some tightness noted in the paraspinal musculature.  Patient does have tenderness noted.  Scoliosis noted.  Lacks last 5 degrees of extension of the back.  Neurovascular intact in all extremities  Osteopathic findings  C2 flexed rotated and side bent right C5 flexed rotated and side bent left T3 extended rotated and side bent right inhaled rib T5 extended rotated and side bent left L3 flexed rotated and side bent left  Sacrum right on right       Assessment and Plan:  Degenerative lumbar spinal stenosis Patient back exam does have some loss of lordosis.  Some degenerative scoliosis noted.  Patient does have tightness with FABER test right greater than left.  Patient is going to continue with  conservative therapy.  Does have the muscle relaxers as needed.    Nonallopathic problems  Decision today to treat with OMT was based on Physical Exam  After verbal consent patient was treated with HVLA, ME, FPR techniques in cervical, rib, thoracic, lumbar, and sacral  areas  Patient tolerated the procedure well with improvement in symptoms  Patient given exercises, stretches and  lifestyle modifications  See medications in patient instructions if given  Patient will follow up in 4-8 weeks      The above documentation has been reviewed and is accurate and complete Lyndal Pulley, DO        Note: This dictation was prepared with Dragon dictation along with smaller phrase technology. Any transcriptional errors that result from this process are unintentional.

## 2021-09-24 ENCOUNTER — Ambulatory Visit: Payer: PPO | Admitting: Family Medicine

## 2021-09-24 VITALS — BP 118/66 | HR 87 | Ht 66.0 in | Wt 128.0 lb

## 2021-09-24 DIAGNOSIS — M9901 Segmental and somatic dysfunction of cervical region: Secondary | ICD-10-CM | POA: Diagnosis not present

## 2021-09-24 DIAGNOSIS — M9908 Segmental and somatic dysfunction of rib cage: Secondary | ICD-10-CM

## 2021-09-24 DIAGNOSIS — M48061 Spinal stenosis, lumbar region without neurogenic claudication: Secondary | ICD-10-CM

## 2021-09-24 DIAGNOSIS — M9904 Segmental and somatic dysfunction of sacral region: Secondary | ICD-10-CM

## 2021-09-24 DIAGNOSIS — M9902 Segmental and somatic dysfunction of thoracic region: Secondary | ICD-10-CM

## 2021-09-24 DIAGNOSIS — M9903 Segmental and somatic dysfunction of lumbar region: Secondary | ICD-10-CM | POA: Diagnosis not present

## 2021-09-24 NOTE — Patient Instructions (Signed)
Good to see you! Have a great trip See you again in 6 weeks

## 2021-09-24 NOTE — Assessment & Plan Note (Signed)
Patient back exam does have some loss of lordosis.  Some degenerative scoliosis noted.  Patient does have tightness with FABER test right greater than left.  Patient is going to continue with conservative therapy.  Does have the muscle relaxers as needed.

## 2021-10-02 DIAGNOSIS — H16223 Keratoconjunctivitis sicca, not specified as Sjogren's, bilateral: Secondary | ICD-10-CM | POA: Diagnosis not present

## 2021-10-02 DIAGNOSIS — H35363 Drusen (degenerative) of macula, bilateral: Secondary | ICD-10-CM | POA: Diagnosis not present

## 2021-10-08 DIAGNOSIS — Z6820 Body mass index (BMI) 20.0-20.9, adult: Secondary | ICD-10-CM | POA: Diagnosis not present

## 2021-10-08 DIAGNOSIS — R5383 Other fatigue: Secondary | ICD-10-CM | POA: Diagnosis not present

## 2021-10-08 DIAGNOSIS — M1991 Primary osteoarthritis, unspecified site: Secondary | ICD-10-CM | POA: Diagnosis not present

## 2021-10-08 DIAGNOSIS — Z79899 Other long term (current) drug therapy: Secondary | ICD-10-CM | POA: Diagnosis not present

## 2021-10-08 DIAGNOSIS — M0609 Rheumatoid arthritis without rheumatoid factor, multiple sites: Secondary | ICD-10-CM | POA: Diagnosis not present

## 2021-10-24 NOTE — Progress Notes (Unsigned)
  [place] Phone: [phone] Subjective:    I'm seeing this patient by the request  of:  Glenis Smoker, MD  CC:   [id number]  Gloria [name] is a 76 y.o. female coming in with complaint of back and neck pain. OMT 09/24/2021.  Patient states   Medications patient has been prescribed: None  Taking:         Reviewed prior external information including notes and imaging from previsou exam, outside providers and external EMR if available.   As well as notes that were available from care everywhere and other healthcare systems.  Past medical history, social, surgical and family history all reviewed in electronic medical record.  No pertanent information unless stated regarding to the chief complaint.   Past Medical History:  Diagnosis Date   Anxiety    Arthritis    CKD (chronic kidney disease), stage III ([place])    Complication of anesthesia 05/2014   had body shaking-observed over night-see anesthesia note-has had tremors post op in past   Depression    Hyperlipemia    Hypertension    Insomnia     Allergies  Allergen Reactions   Versed [Midazolam] Other (See Comments)    Uncontrolled shaking   Diprivan [Propofol] Other (See Comments)    Uncontrolled shaking   Fentanyl Other (See Comments)    Uncontrolled shaking   Sevoflurane Other (See Comments)    Uncontrolled shaking   Zofran [Ondansetron Hcl] Other (See Comments)    Uncontrolled shaking     Review of Systems:  No headache, visual changes, nausea, vomiting, diarrhea, constipation, dizziness, abdominal pain, skin rash, fevers, chills, night sweats, weight loss, swollen lymph nodes, body aches, joint swelling, chest pain, shortness of breath, mood changes. POSITIVE muscle aches  Objective  There were no vitals taken for this visit.   General: No apparent distress alert and oriented x3 mood and affect normal, dressed  appropriately.  HEENT: Pupils equal, extraocular movements intact  Respiratory: Patient's speak in full sentences and does not appear short of breath  Cardiovascular: No lower extremity edema, non tender, no erythema  Gait MSK:  Back   Osteopathic findings  C2 flexed rotated and side bent right C6 flexed rotated and side bent left T3 extended rotated and side bent right inhaled rib T9 extended rotated and side bent left L2 flexed rotated and side bent right Sacrum right on right       Assessment and Plan:  No problem-specific Assessment & Plan notes found for this encounter.    Nonallopathic problems  Decision today to treat with OMT was based on Physical Exam  After verbal consent patient was treated with HVLA, ME, FPR techniques in cervical, rib, thoracic, lumbar, and sacral  areas  Patient tolerated the procedure well with improvement in symptoms  Patient given exercises, stretches and lifestyle modifications  See medications in patient instructions if given  Patient will follow up in 4-8 weeks             Note: This dictation was prepared with Dragon dictation along with smaller phrase technology. Any transcriptional errors that result from this process are unintentional.

## 2021-10-29 ENCOUNTER — Ambulatory Visit: Payer: PPO | Admitting: Family Medicine

## 2021-10-29 VITALS — BP 110/80 | HR 75 | Ht 66.0 in | Wt 127.0 lb

## 2021-10-29 DIAGNOSIS — M9908 Segmental and somatic dysfunction of rib cage: Secondary | ICD-10-CM

## 2021-10-29 DIAGNOSIS — M9901 Segmental and somatic dysfunction of cervical region: Secondary | ICD-10-CM | POA: Diagnosis not present

## 2021-10-29 DIAGNOSIS — M9902 Segmental and somatic dysfunction of thoracic region: Secondary | ICD-10-CM | POA: Diagnosis not present

## 2021-10-29 DIAGNOSIS — M48061 Spinal stenosis, lumbar region without neurogenic claudication: Secondary | ICD-10-CM | POA: Diagnosis not present

## 2021-10-29 DIAGNOSIS — M9904 Segmental and somatic dysfunction of sacral region: Secondary | ICD-10-CM

## 2021-10-29 DIAGNOSIS — M9903 Segmental and somatic dysfunction of lumbar region: Secondary | ICD-10-CM

## 2021-12-05 NOTE — Progress Notes (Signed)
[place] Phone: [phone] Subjective:   Gloria Gloria [name], am serving as a scribe for Dr. Hulan Saas.  I'm seeing this patient by the request  of:  Glenis Smoker, MD  CC: back and neck pain  [id number]  Gloria Gloria [name] is a 76 y.o. female coming in with complaint of back and neck pain. OMT 10/29/2021. Patient states that she is using [place] more often than not. Back and neck pain are the same as last.   Medications patient has been prescribed: None  Taking:         Reviewed prior external information including notes and imaging from previsou exam, outside providers and external EMR if available.   As well as notes that were available from care everywhere and other healthcare systems.  Past medical history, social, surgical and family history all reviewed in electronic medical record.  Gloria [name] unless stated regarding to the chief complaint.   Past Medical History:  Diagnosis Date   Anxiety    Arthritis    CKD (chronic kidney disease), stage III ([place])    Complication of anesthesia 05/2014   had body shaking-observed over night-see anesthesia note-has had tremors post op in past   Depression    Hyperlipemia    Hypertension    Insomnia     Allergies  Allergen Reactions   Versed [Midazolam] Other (See Comments)    Uncontrolled shaking   Diprivan [Propofol] Other (See Comments)    Uncontrolled shaking   Fentanyl Other (See Comments)    Uncontrolled shaking   Sevoflurane Other (See Comments)    Uncontrolled shaking   Zofran [Ondansetron Hcl] Other (See Comments)    Uncontrolled shaking     Review of Systems:  Gloria [name] headache, visual changes, nausea, vomiting, diarrhea, constipation, dizziness, abdominal pain, skin rash, fevers, chills, night sweats, weight loss, swollen lymph nodes, body aches, joint swelling, chest pain, shortness of breath, mood changes. POSITIVE  muscle aches  Objective  Blood pressure 120/78, pulse 81, height '5\' 6"'$  (1.676 m), weight 128 lb (58.1 kg), SpO2 98 %.   General: Gloria [name] apparent distress alert and oriented x3 mood and affect normal, dressed appropriately.  HEENT: Pupils equal, extraocular movements intact  Respiratory: Patient's speak in full sentences and does not appear short of breath  Cardiovascular: Gloria [name] lower extremity edema, non tender, Gloria [name] erythema  Gait normal  MSK:  Back loss of lordosis does have some tightness noted.  Patient also has what appears to be some scoliosis noted.  Mild increase in tightness noted around the right sacroiliac joint and patient's  Osteopathic findings  C3 flexed rotated and side bent right C6 flexed rotated and side bent left T3 extended rotated and side bent right inhaled rib T8 extended rotated and side bent left L5 flexed rotated and side bent left  Sacrum right on right    Assessment and Plan:  Degenerative lumbar spinal stenosis Degenerative spinal stenosis.  Discussed icing regimen and home exercises.  We did discuss with patient having difficulty with the appetite that we were going to start with some Remeron.  At this point though we will hold off.    Nonallopathic problems  Decision today to treat with OMT was based on Physical Exam  After verbal consent patient was treated with HVLA, ME, FPR techniques in cervical, rib, thoracic, lumbar, and sacral  areas  Patient tolerated the procedure well with improvement in symptoms  Patient given exercises,  stretches and lifestyle modifications  See medications in patient instructions if given  Patient will follow up in 4-8 weeks    The above documentation has been reviewed and is accurate and complete Gloria Pulley, DO          Note: This dictation was prepared with Dragon dictation along with smaller phrase technology. Any transcriptional errors that result from this process are unintentional.

## 2021-12-10 ENCOUNTER — Ambulatory Visit: Payer: PPO | Admitting: Family Medicine

## 2021-12-10 ENCOUNTER — Telehealth: Payer: Self-pay | Admitting: Family Medicine

## 2021-12-10 VITALS — BP 120/78 | HR 81 | Ht 66.0 in | Wt 128.0 lb

## 2021-12-10 DIAGNOSIS — M48061 Spinal stenosis, lumbar region without neurogenic claudication: Secondary | ICD-10-CM | POA: Diagnosis not present

## 2021-12-10 DIAGNOSIS — M9901 Segmental and somatic dysfunction of cervical region: Secondary | ICD-10-CM

## 2021-12-10 DIAGNOSIS — M9903 Segmental and somatic dysfunction of lumbar region: Secondary | ICD-10-CM | POA: Diagnosis not present

## 2021-12-10 DIAGNOSIS — M9904 Segmental and somatic dysfunction of sacral region: Secondary | ICD-10-CM | POA: Diagnosis not present

## 2021-12-10 DIAGNOSIS — M9908 Segmental and somatic dysfunction of rib cage: Secondary | ICD-10-CM

## 2021-12-10 DIAGNOSIS — M9902 Segmental and somatic dysfunction of thoracic region: Secondary | ICD-10-CM | POA: Diagnosis not present

## 2021-12-10 MED ORDER — MIRTAZAPINE 15 MG PO TABS: 7.5000 mg | ORAL_TABLET | Freq: Every day | ORAL | 0 refills | Status: DC

## 2021-12-10 NOTE — Telephone Encounter (Signed)
Spoke with patient about recommendations

## 2021-12-10 NOTE — Telephone Encounter (Signed)
Yeah lets keep her off of it

## 2021-12-10 NOTE — Assessment & Plan Note (Addendum)
Degenerative spinal stenosis.  Discussed icing regimen and home exercises.  We did discuss with patient having difficulty with the appetite that we were going to start with some Remeron.  At this point though we will hold off.  Patient will continue to do home exercises.  Follow-up again in 6 to 8 weeks

## 2021-12-10 NOTE — Telephone Encounter (Signed)
We rx'd mirtazapine today. Pt called, remembers taking this in the past and was warned not to continue as it was bad for her eyes ( dry macular degeneration ).  Pt advised not to start med until she hears from [place].

## 2021-12-10 NOTE — Patient Instructions (Addendum)
Exercises Remeron 7.5 mg at night See me again in 5-6 weeks

## 2021-12-13 DIAGNOSIS — E7800 Pure hypercholesterolemia, unspecified: Secondary | ICD-10-CM | POA: Diagnosis not present

## 2021-12-13 DIAGNOSIS — M810 Age-related osteoporosis without current pathological fracture: Secondary | ICD-10-CM | POA: Diagnosis not present

## 2021-12-13 DIAGNOSIS — N1831 Chronic kidney disease, stage 3a: Secondary | ICD-10-CM | POA: Diagnosis not present

## 2021-12-13 DIAGNOSIS — I10 Essential (primary) hypertension: Secondary | ICD-10-CM | POA: Diagnosis not present

## 2021-12-17 DIAGNOSIS — D84821 Immunodeficiency due to drugs: Secondary | ICD-10-CM | POA: Diagnosis not present

## 2021-12-17 DIAGNOSIS — M0609 Rheumatoid arthritis without rheumatoid factor, multiple sites: Secondary | ICD-10-CM | POA: Diagnosis not present

## 2021-12-17 DIAGNOSIS — G2581 Restless legs syndrome: Secondary | ICD-10-CM | POA: Diagnosis not present

## 2021-12-17 DIAGNOSIS — Z7982 Long term (current) use of aspirin: Secondary | ICD-10-CM | POA: Diagnosis not present

## 2021-12-17 DIAGNOSIS — E785 Hyperlipidemia, unspecified: Secondary | ICD-10-CM | POA: Diagnosis not present

## 2021-12-17 DIAGNOSIS — I10 Essential (primary) hypertension: Secondary | ICD-10-CM | POA: Diagnosis not present

## 2021-12-17 DIAGNOSIS — G8929 Other chronic pain: Secondary | ICD-10-CM | POA: Diagnosis not present

## 2021-12-17 DIAGNOSIS — G4700 Insomnia, unspecified: Secondary | ICD-10-CM | POA: Diagnosis not present

## 2021-12-17 DIAGNOSIS — M810 Age-related osteoporosis without current pathological fracture: Secondary | ICD-10-CM | POA: Diagnosis not present

## 2021-12-17 DIAGNOSIS — N183 Chronic kidney disease, stage 3 unspecified: Secondary | ICD-10-CM | POA: Diagnosis not present

## 2021-12-17 DIAGNOSIS — F3342 Major depressive disorder, recurrent, in full remission: Secondary | ICD-10-CM | POA: Diagnosis not present

## 2021-12-17 DIAGNOSIS — H3530 Unspecified macular degeneration: Secondary | ICD-10-CM | POA: Diagnosis not present

## 2021-12-31 DIAGNOSIS — E559 Vitamin D deficiency, unspecified: Secondary | ICD-10-CM | POA: Diagnosis not present

## 2021-12-31 DIAGNOSIS — Z79899 Other long term (current) drug therapy: Secondary | ICD-10-CM | POA: Diagnosis not present

## 2021-12-31 DIAGNOSIS — M810 Age-related osteoporosis without current pathological fracture: Secondary | ICD-10-CM | POA: Diagnosis not present

## 2021-12-31 DIAGNOSIS — E7800 Pure hypercholesterolemia, unspecified: Secondary | ICD-10-CM | POA: Diagnosis not present

## 2021-12-31 DIAGNOSIS — I10 Essential (primary) hypertension: Secondary | ICD-10-CM | POA: Diagnosis not present

## 2021-12-31 DIAGNOSIS — M069 Rheumatoid arthritis, unspecified: Secondary | ICD-10-CM | POA: Diagnosis not present

## 2021-12-31 DIAGNOSIS — Z0000 Encounter for general adult medical examination without abnormal findings: Secondary | ICD-10-CM | POA: Diagnosis not present

## 2021-12-31 DIAGNOSIS — N1831 Chronic kidney disease, stage 3a: Secondary | ICD-10-CM | POA: Diagnosis not present

## 2022-01-15 NOTE — Progress Notes (Signed)
Gloria [name] [address] [place] Phone: [phone] Subjective:   Gloria [name], am serving as a scribe for Dr. Hulan Saas.  I'm seeing this patient by the request  of:  Glenis Smoker, MD  CC: Back and neck pain, right shoulder pain  [id number]  Gloria [name] is a 76 y.o. female coming in with complaint of back and neck pain. OMT 12/10/2021. Patient states doing well. Same per usual. Right arm has been more achy lately. No new issues.  Medications patient has been prescribed: None  Taking:         Reviewed prior external information including notes and imaging from previsou exam, outside providers and external EMR if available.   As well as notes that were available from care everywhere and other healthcare systems.  Past medical history, social, surgical and family history all reviewed in electronic medical record.  No pertanent information unless stated regarding to the chief complaint.   Past Medical History:  Diagnosis Date   Anxiety    Arthritis    CKD (chronic kidney disease), stage III ([place])    Complication of anesthesia 05/2014   had body shaking-observed over night-see anesthesia note-has had tremors post op in past   Depression    Hyperlipemia    Hypertension    Insomnia     Allergies  Allergen Reactions   Versed [Midazolam] Other (See Comments)    Uncontrolled shaking   Diprivan [Propofol] Other (See Comments)    Uncontrolled shaking   Fentanyl Other (See Comments)    Uncontrolled shaking   Sevoflurane Other (See Comments)    Uncontrolled shaking   Zofran [Ondansetron Hcl] Other (See Comments)    Uncontrolled shaking     Review of Systems:  No headache, visual changes, nausea, vomiting, diarrhea, constipation, dizziness, abdominal pain, skin rash, fevers, chills, night sweats, weight loss, swollen lymph nodes, body aches, joint swelling, chest pain, shortness of breath, mood changes.  POSITIVE muscle aches  Objective  Blood pressure 130/72, pulse 87, height '5\' 6"'$  (1.676 m), weight 132 lb (59.9 kg), SpO2 96 %.   General: No apparent distress alert and oriented x3 mood and affect normal, dressed appropriately.  HEENT: Pupils equal, extraocular movements intact  Respiratory: Patient's speak in full sentences and does not appear short of breath  Cardiovascular: No lower extremity edema, non tender, no erythema  Gait MSK:  Back continues to have loss of lordosis with some degenerative scoliosis noted.  Mild tightness with the right FABER test.  Right shoulder exam shows 5 out of 5 strength of the rotator cuff.  Patient though does have limited internal and external range of motion compared to the contralateral side.  Very mild positive crossover test noted as well.  Osteopathic findings  C2 flexed rotated and side bent right C7 flexed rotated and side bent left T5 extended rotated and side bent right inhaled rib T9 extended rotated and side bent left L1 flexed rotated and side bent right Sacrum right on right       Assessment and Plan:  Frozen shoulder Patient does have what appears to be more early frozen shoulder.  Rotator cuff strength is intact.  Differential also includes possible arthritis we will get x-rays.  Patient will work on the range of motion.  Does have a past medical history for rheumatoid arthritis we will make sure there is no erosive changes.  Follow-up again in 6 to 8 weeks.  Degenerative lumbar spinal  stenosis Continue chronic problem.  Taking care of her sister who does have unfortunately a newly diagnosed colon cancer.  Undergoing surgery next week.  Patient is going to be doing much more activities.  Follow-up with me again in 5 to 6 weeks likely because of more lifting and other mechanics and will be more activity for her.    Nonallopathic problems  Decision today to treat with OMT was based on Physical Exam  After verbal consent  patient was treated with HVLA, ME, FPR techniques in cervical, rib, thoracic, lumbar, and sacral  areas  Patient tolerated the procedure well with improvement in symptoms  Patient given exercises, stretches and lifestyle modifications  See medications in patient instructions if given  Patient will follow up in 4-8 weeks    The above documentation has been reviewed and is accurate and complete Lyndal Pulley, DO          Note: This dictation was prepared with Dragon dictation along with smaller phrase technology. Any transcriptional errors that result from this process are unintentional.

## 2022-01-16 ENCOUNTER — Ambulatory Visit: Payer: PPO | Admitting: Family Medicine

## 2022-01-16 ENCOUNTER — Ambulatory Visit (INDEPENDENT_AMBULATORY_CARE_PROVIDER_SITE_OTHER): Payer: PPO

## 2022-01-16 VITALS — BP 130/72 | HR 87 | Ht 66.0 in | Wt 132.0 lb

## 2022-01-16 DIAGNOSIS — M9903 Segmental and somatic dysfunction of lumbar region: Secondary | ICD-10-CM

## 2022-01-16 DIAGNOSIS — M25511 Pain in right shoulder: Secondary | ICD-10-CM

## 2022-01-16 DIAGNOSIS — M7501 Adhesive capsulitis of right shoulder: Secondary | ICD-10-CM | POA: Diagnosis not present

## 2022-01-16 DIAGNOSIS — M75121 Complete rotator cuff tear or rupture of right shoulder, not specified as traumatic: Secondary | ICD-10-CM

## 2022-01-16 DIAGNOSIS — M9908 Segmental and somatic dysfunction of rib cage: Secondary | ICD-10-CM | POA: Diagnosis not present

## 2022-01-16 DIAGNOSIS — M7500 Adhesive capsulitis of unspecified shoulder: Secondary | ICD-10-CM | POA: Insufficient documentation

## 2022-01-16 DIAGNOSIS — M48061 Spinal stenosis, lumbar region without neurogenic claudication: Secondary | ICD-10-CM | POA: Diagnosis not present

## 2022-01-16 DIAGNOSIS — M9904 Segmental and somatic dysfunction of sacral region: Secondary | ICD-10-CM | POA: Diagnosis not present

## 2022-01-16 DIAGNOSIS — M9901 Segmental and somatic dysfunction of cervical region: Secondary | ICD-10-CM | POA: Diagnosis not present

## 2022-01-16 DIAGNOSIS — M9902 Segmental and somatic dysfunction of thoracic region: Secondary | ICD-10-CM | POA: Diagnosis not present

## 2022-01-16 NOTE — Assessment & Plan Note (Signed)
Patient does have what appears to be more early frozen shoulder.  Rotator cuff strength is intact.  Differential also includes possible arthritis we will get x-rays.  Patient will work on the range of motion.  Does have a past medical history for rheumatoid arthritis we will make sure there is no erosive changes.  Follow-up again in 6 to 8 weeks.

## 2022-01-16 NOTE — Assessment & Plan Note (Signed)
No longer having weakness but I am concerned that there is possibly some rotator cuff arthropathy and x-rays are ordered.  I do think frozen shoulder is more likely but also some mild arthritic changes Home exercises given today.

## 2022-01-16 NOTE — Patient Instructions (Signed)
Do prescribed exercises at least 3x a week Xray today

## 2022-01-16 NOTE — Assessment & Plan Note (Signed)
Continue chronic problem.  Taking care of her sister who does have unfortunately a newly diagnosed colon cancer.  Undergoing surgery next week.  Patient is going to be doing much more activities.  Follow-up with me again in 5 to 6 weeks likely because of more lifting and other mechanics and will be more activity for her.

## 2022-01-20 ENCOUNTER — Ambulatory Visit: Payer: PPO | Admitting: Family Medicine

## 2022-01-20 ENCOUNTER — Ambulatory Visit: Payer: Self-pay

## 2022-01-20 ENCOUNTER — Ambulatory Visit (INDEPENDENT_AMBULATORY_CARE_PROVIDER_SITE_OTHER): Payer: PPO

## 2022-01-20 ENCOUNTER — Telehealth: Payer: Self-pay | Admitting: Family Medicine

## 2022-01-20 VITALS — BP 144/78 | HR 76 | Ht 66.0 in | Wt 131.0 lb

## 2022-01-20 DIAGNOSIS — M25561 Pain in right knee: Secondary | ICD-10-CM

## 2022-01-20 DIAGNOSIS — R2241 Localized swelling, mass and lump, right lower limb: Secondary | ICD-10-CM

## 2022-01-20 DIAGNOSIS — M1711 Unilateral primary osteoarthritis, right knee: Secondary | ICD-10-CM | POA: Diagnosis not present

## 2022-01-20 MED ORDER — AMBULATORY NON FORMULARY MEDICATION: 0 refills | Status: AC

## 2022-01-20 NOTE — Progress Notes (Signed)
I, Gloria [name], LAT, ATC acting as a scribe for Gloria Leader, MD.  Gloria [name] is a 76 y.o. female who presents to [place] at [facility] today for R lower leg pain. Pt was last seen by Dr. [name] on 01/16/22 R shoulder pain and OMT. Today, pt c/o R lower leg pain ongoing since 9/14. Pt was leaving her visit w/ Dr. [name] and then noticed a twinge of pain that then worsened throughout the day to the point she can hardly bear weight. Pt locates pain to proximal, posterior aspect of the R lower leg. Pt has a hx of laminectomy at L4-5.  No injury history  LE swelling: no Radiates: no Aggravates: knee ext Treatments tried: ice, heat   Pertinent review of systems: no fever or chills.   Relevant historical information: Lumbar surgery as above.  History of rheumatoid arthritis. History of rotator cuff surgery.  Exam:  BP (!) 144/78   Pulse 76   Ht '5\' 6"'$  (1.676 m)   Wt 131 lb (59.4 kg)   SpO2 98%   BMI 21.14 kg/m  General: Well Developed, well nourished, and in no acute distress.   MSK: Right knee and calf normal-appearing without significant swelling. Tender palpation posterior medial knee. Range of motion knee lacks full extension by 5 degrees Full flexion as present. Ankle dorsiflexion is limited as well. Gait significantly antalgic unable to fully extend the knee or fully dorsiflex the ankle. Strength intact pain with resisted knee flexion. Stable ligamentous exam.    Lab and Radiology Results  Diagnostic Limited MSK Ultrasound of: Right posterior knee Small Baker's cyst is visible. However at the right proximal medial calf there are 2 large heterogeneous structures measuring little over 1 cm each with some mild doppler flow.  This is concerning for mass versus atypical appearance of hematoma Impression: Calf muscle mass proximal medial gastrocnemius and a moderate Baker's cyst.  X-ray images right tib-fib personally independently interpreted today.   Tib-fib shows no bony mass in the posterior medial knee.  Mild knee DJD is present.  No acute fractures are present. Await formal radiology review   Assessment and Plan: 75 y.o. female with right posterior knee pain and swelling.  Concerning for mass seen on MSK ultrasound.  Plan for MRI to further evaluate the mass.  After discussion with radiology are present with MRI with and without contrast.  In the meantime use a walker to ambulate.   PDMP not reviewed this encounter. Orders Placed This Encounter  Procedures   [place] LIMITED JOINT SPACE STRUCTURES LOW RIGHT(NO LINKED CHARGES)    Order Specific Question:   Reason for Exam (SYMPTOM  OR DIAGNOSIS REQUIRED)    Answer:   right knee pain    Order Specific Question:   Preferred imaging location?    Answer:   [place]   DG Tibia/Fibula Right    Standing Status:   Future    Number of Occurrences:   1    Standing Expiration Date:   01/21/2023    Order Specific Question:   Reason for Exam (SYMPTOM  OR DIAGNOSIS REQUIRED)    Answer:   eval mass post proximal calf    Order Specific Question:   Preferred imaging location?    Answer:   [facility]   MR TIBIA FIBULA RIGHT [place] just above the knee as well.  Eval mass proximal medial calf.  Discussed with MSK [place] Pronto  Standing Status:   Future    Standing Expiration Date:   01/21/2023    Order Specific Question:   If indicated for the ordered procedure, I authorize the administration of contrast media per Radiology protocol    Answer:   Yes    Order Specific Question:   What is the patient's sedation requirement?    Answer:   No Sedation    Order Specific Question:   Does the patient have a pacemaker or implanted devices?    Answer:   No    Order Specific Question:   Preferred imaging location?    Answer:   [facility] (table limit-550lbs)   Meds ordered this encounter  Medications   AMBULATORY NON FORMULARY MEDICATION    Sig:  Walker Disp 1    Dispense:  1 each    Refill:  0     Discussed warning signs or symptoms. Please see discharge instructions. Patient expresses understanding.   The above documentation has been reviewed and is accurate and complete Gloria [name], M.D.

## 2022-01-20 NOTE — Patient Instructions (Signed)
Thank you for coming in today.   Please get an Xray today before you leave   You should hear from MRI scheduling within 1 week. If you do not hear please let me know.    Recheck after the MRI.   Use a walker for now.

## 2022-01-20 NOTE — Telephone Encounter (Signed)
Patient called asking if the MRI location could be changed to [facility] (she said that Dr Georgina Snell had mentioned the [facility] location during her visit). She also asked if it would be beneficial to order an MRI of her lower back as well?  Please advise.

## 2022-01-21 ENCOUNTER — Other Ambulatory Visit: Payer: Self-pay | Admitting: Family Medicine

## 2022-01-21 NOTE — Telephone Encounter (Signed)
I have updated the location to the med center [place]. This will be a long MRI examination.  It will take about 40 minutes or so.  To include a lumbar spine MRI would put you in the MRI scanner for about an hour or longer which I think would be a little excessive.  I would suggest that we MRI the location that is the most likely to be the source of your discomfort and pain and if that is not helpful then we can do an MRI of the lumbar spine.  But I think trying to put you in that scanner for that amount of time would be challenging.

## 2022-01-21 NOTE — Telephone Encounter (Signed)
Spoke to pt and relayed this information. Pt was also provided the address and # to call [facility] to schedule her MRI. Pt verbalized understanding.

## 2022-01-21 NOTE — Progress Notes (Signed)
X-ray tib-fib does not show significant bony abnormality where I saw that mass at the back of the calf. We were going to proceed with an MRI with and without contrast to further evaluate the mass.

## 2022-01-27 ENCOUNTER — Ambulatory Visit (INDEPENDENT_AMBULATORY_CARE_PROVIDER_SITE_OTHER): Payer: PPO

## 2022-01-27 DIAGNOSIS — M7121 Synovial cyst of popliteal space [Baker], right knee: Secondary | ICD-10-CM | POA: Diagnosis not present

## 2022-01-27 DIAGNOSIS — R2241 Localized swelling, mass and lump, right lower limb: Secondary | ICD-10-CM | POA: Diagnosis not present

## 2022-01-27 DIAGNOSIS — M7989 Other specified soft tissue disorders: Secondary | ICD-10-CM | POA: Diagnosis not present

## 2022-01-27 MED ORDER — GADOPICLENOL 0.5 MMOL/ML IV SOLN
6.0000 mL | Freq: Once | INTRAVENOUS | Status: AC | PRN
  Administered 2022-01-27: 6 mL via INTRAVENOUS

## 2022-01-29 ENCOUNTER — Telehealth: Payer: Self-pay | Admitting: Family Medicine

## 2022-01-29 DIAGNOSIS — R2241 Localized swelling, mass and lump, right lower limb: Secondary | ICD-10-CM

## 2022-01-29 NOTE — Telephone Encounter (Signed)
Refer to orthopedic oncology

## 2022-01-29 NOTE — Telephone Encounter (Signed)
I called Gloria [name] and reviewed the results of the MRI and plan to refer to orthopedic oncology.  Stressed the importance of bringing a copy of the CD images with her to the appointment.  Answered questions.  We will mail her a copy of the CD.  Check back with Dr. [name] as scheduled on 25 October.

## 2022-01-29 NOTE — Progress Notes (Signed)
MRI of the back of the leg does show a soft tissue mass measuring about 3 x 6 cm that looks like it is probably a hematoma but could possibly be a sarcoma which is a soft tissue cancer or tumor.  Cancer is unlikely but not impossible therefore radiology recommends that we do a biopsy.  I going to refer you to orthopedic oncology to help guide [place] through the best way to do a biopsy of this.  You should hear from Dr. Smith Robert office soon.  Do you have a CD made of the MRI images?  You will need to bring that with you to the appointment.  If not you can get one made at the [facility] imaging location.

## 2022-01-30 DIAGNOSIS — R2241 Localized swelling, mass and lump, right lower limb: Secondary | ICD-10-CM | POA: Diagnosis not present

## 2022-02-07 ENCOUNTER — Other Ambulatory Visit: Payer: Self-pay | Admitting: Family Medicine

## 2022-02-07 ENCOUNTER — Other Ambulatory Visit (HOSPITAL_COMMUNITY): Payer: Self-pay | Admitting: Family Medicine

## 2022-02-07 DIAGNOSIS — R9389 Abnormal findings on diagnostic imaging of other specified body structures: Secondary | ICD-10-CM

## 2022-02-07 NOTE — Progress Notes (Unsigned)
Gloria Peaches, MD  Donita Brooks D Approved for US guided aspiration and/or BX of calf mass.  Hematoma vs. Sarcoma.   [place]

## 2022-02-08 ENCOUNTER — Other Ambulatory Visit: Payer: Self-pay | Admitting: Family Medicine

## 2022-02-08 DIAGNOSIS — R9389 Abnormal findings on diagnostic imaging of other specified body structures: Secondary | ICD-10-CM

## 2022-02-12 ENCOUNTER — Ambulatory Visit (HOSPITAL_COMMUNITY)
Admission: RE | Admit: 2022-02-12 | Discharge: 2022-02-12 | Disposition: A | Discharge status: Home / Self Care | Payer: PPO | Source: Ambulatory Visit | Attending: Family Medicine | Admitting: Family Medicine

## 2022-02-12 ENCOUNTER — Other Ambulatory Visit: Payer: Self-pay

## 2022-02-12 DIAGNOSIS — R9389 Abnormal findings on diagnostic imaging of other specified body structures: Secondary | ICD-10-CM

## 2022-02-12 DIAGNOSIS — R2241 Localized swelling, mass and lump, right lower limb: Secondary | ICD-10-CM | POA: Diagnosis not present

## 2022-02-12 DIAGNOSIS — M7989 Other specified soft tissue disorders: Secondary | ICD-10-CM | POA: Diagnosis not present

## 2022-02-12 MED ORDER — LIDOCAINE-EPINEPHRINE 1 %-1:100000 IJ SOLN
INTRAMUSCULAR | Status: AC
  Filled 2022-02-12: qty 1

## 2022-02-12 NOTE — Procedures (Signed)
Pre Procedure Dx: Right calf mass/hematoma Post Procedural Dx: Same  Technically successful US guided biopsy of suspected hematoma within the right calf musculature. Successful US guided aspiration of 2 cc of serous viscous fluid from adjacent tiny Baker's cyst.  EBL: None No immediate complications.   Ronny Bacon, MD Pager #: [phone]

## 2022-02-12 NOTE — H&P (Signed)
Chief Complaint: Patient was seen in consultation today for right calf mass biopsy   Referring Physician(s): Timberlake,Kathryn S  Supervising Physician: Sandi Mariscal  Patient Status: [facility] - Out-pt  History of Present Illness: Gloria [name] is a 76 y.o. female worked up for right calf pain. Found to have a gastroc mass , suspicious for hematoma but also possibly soft tissue sarcoma. Therefore, she is scheduled for image guided biopsy. PMHx, meds, labs, imaging, allergies reviewed. Feels well, no recent fevers, chills, illness.   Past Medical History:  Diagnosis Date   Anxiety    Arthritis    CKD (chronic kidney disease), stage III ([place])    Complication of anesthesia 05/2014   had body shaking-observed over night-see anesthesia note-has had tremors post op in past   Depression    Hyperlipemia    Hypertension    Insomnia     Past Surgical History:  Procedure Laterality Date   ABDOMINAL HYSTERECTOMY     age 41   BACK SURGERY     CARPAL TUNNEL RELEASE  01/20/2012   Procedure: CARPAL TUNNEL RELEASE;  Surgeon: Wynonia Sours, MD;  Location: [place];  Service: Orthopedics;  Laterality: Right;   CARPAL TUNNEL RELEASE  02/25/2012   Procedure: CARPAL TUNNEL RELEASE;  Surgeon: Wynonia Sours, MD;  Location: [place];  Service: Orthopedics;  Laterality: Left;  RELEASE A-1 PULLEY LRF   COLONOSCOPY WITH PROPOFOL N/A 10/15/2016   Procedure: COLONOSCOPY WITH PROPOFOL;  Surgeon: Wilford Corner, MD;  Location: [place];  Service: Endoscopy;  Laterality: N/A;   DISTAL BICEPS TENDON REPAIR Left 06/12/2015   Procedure: REPAIR LEFT BICEPS TENDON AND DECOMPRESSION OF MEDIAN NERVE;  Surgeon: Daryll Brod, MD;  Location: [place];  Service: Orthopedics;  Laterality: Left;   SMALL INTESTINE SURGERY     SMALL INTESTINE SURGERY     has had 6 total bowel obstructions -mostlt adhesions-last 2009   TONSILLECTOMY     age 43   TRIGGER FINGER RELEASE   2/12   rt middle   TRIGGER FINGER RELEASE  2010   lt middle   ULNAR NERVE TRANSPOSITION Right 05/11/2014   Procedure: DECOMPRESSION ULNAR NERVE RIGHT ELBOW ;  Surgeon: Daryll Brod, MD;  Location: [place];  Service: Orthopedics;  Laterality: Right;   ULNAR NERVE TRANSPOSITION Left 07/06/2014   Procedure: DECOMPRESSION  LEFT ULNAR NERVE;  Surgeon: Daryll Brod, MD;  Location: [place];  Service: Orthopedics;  Laterality: Left;    Allergies: Versed [midazolam], Diprivan [propofol], Fentanyl, Sevoflurane, and Zofran [ondansetron hcl]  Medications: Prior to Admission medications   Medication Sig Start Date End Date Taking? Authorizing Provider  alendronate (FOSAMAX) 10 MG tablet Take 10 mg by mouth daily before breakfast. Take with a full glass of water on an empty stomach.   Yes [provider]  amLODipine (NORVASC) 2.5 MG tablet Take 2.5 mg by mouth daily.   Yes [provider]  aspirin EC 81 MG tablet Take 81 mg by mouth daily. Swallow whole.   Yes [provider]  Brimonidine Tartrate (LUMIFY) 0.025 % SOLN Place 1 drop into both eyes daily as needed (redness).   Yes [provider]  cyanocobalamin (VITAMIN B12) 500 MCG tablet Take 500 mcg by mouth daily.   Yes [provider]  estradiol (ESTRACE) 0.1 MG/GM vaginal cream Place 1 Applicatorful vaginally 2 (two) times a week.   Yes [provider]  hydroxychloroquine (PLAQUENIL) 200 MG tablet  Take 400 mg by mouth daily.    Yes [provider]  Multiple Vitamin (MULTIVITAMIN WITH MINERALS) TABS tablet Take 1 tablet by mouth daily.   Yes [provider]  Multiple Vitamins-Minerals (PRESERVISION AREDS 2) CAPS Take 1 capsule by mouth 2 (two) times daily.   Yes [provider]  TART CHERRY PO Take 10,500 mg by mouth daily.   Yes [provider]  tiZANidine (ZANAFLEX) 4 MG tablet Take 4-8 mg by mouth at bedtime as needed for muscle  spasms. 01/06/20  Yes [provider]  traZODone (DESYREL) 50 MG tablet Take 50-150 mg by mouth at bedtime as needed for sleep.   Yes [provider]  Turmeric Curcumin 500 MG CAPS Take 500 mg by mouth daily.   Yes [provider]  valsartan (DIOVAN) 160 MG tablet Take 160 mg by mouth daily.   Yes [provider]  Zinc 50 MG TABS Take 50 mg by mouth daily.   Yes [provider]  AMBULATORY NON FORMULARY MEDICATION Gilford Rile Disp 1 01/20/22   Gregor Hams, MD     Family History  Problem Relation Age of Onset   Heart failure Father     Social History   Socioeconomic History   Marital status: Married    Spouse name: Not on file   Number of children: Not on file   Years of education: Not on file   Highest education level: Not on file  Occupational History   Not on file  Tobacco Use   Smoking status: Never   Smokeless tobacco: Never  Vaping Use   Vaping Use: Never used  Substance and Sexual Activity   Alcohol use: No   Drug use: No   Sexual activity: Not on file  Other Topics Concern   Not on file  Social History Narrative   Not on file   Social Determinants of Health   Financial Resource Strain: Not on file  Food Insecurity: Not on file  Transportation Needs: Not on file  Physical Activity: Not on file  Stress: Not on file  Social Connections: Not on file    Review of Systems: A 12 point ROS discussed and pertinent positives are indicated in the HPI above.  All other systems are negative.  Review of Systems  Vital Signs: BP (!) 151/78   Pulse 76   Temp 98 F (36.7 C) (Oral)   Resp 16   Ht 5' 6.5" (1.689 m)   Wt 129 lb (58.5 kg)   SpO2 96%   BMI 20.51 kg/m   Physical Exam Constitutional:      Appearance: She is not ill-appearing.  Cardiovascular:     Rate and Rhythm: Normal rate and regular rhythm.     Heart sounds: Normal heart sounds.  Pulmonary:     Effort: Pulmonary effort is normal. No respiratory distress.      Breath sounds: Normal breath sounds.  Neurological:     General: No focal deficit present.     Mental Status: She is alert and oriented to person, place, and time.  Psychiatric:        Mood and Affect: Mood normal.        Thought Content: Thought content normal.        Judgment: Judgment normal.     Imaging: MR TIBIA FIBULA RIGHT W WO CONTRAST  Result Date: 01/28/2022 CLINICAL DATA:  Right knee pain EXAM: MRI OF LOWER RIGHT EXTREMITY WITHOUT AND WITH CONTRAST TECHNIQUE: Multiplanar, multisequence MR  imaging of the right lower extremity was performed both before and after administration of intravenous contrast. CONTRAST:  6 mL  godopiclenol COMPARISON:  Radiograph 01/20/2022, ultrasound 01/20/2022. FINDINGS: Bones/Joint/Cartilage The cortex is intact. There is no significant marrow signal alteration. Ligaments The intraosseous membrane is unremarkable. Intact ACL and PCL. Intact collateral ligaments of the knee. Muscles and Tendons Within the proximal medial head gastrocnemius, there is a heterogeneous mass with intrinsic peripheral T1 signal and central high T2 signal a measuring 2.9 x 2.7 x 5.7 cm (series 11, image 20, series 10, image 14). There are some thicker areas along the periphery which appear more nodular and enhancing. There is mild adjacent soft tissue swelling and enhancement. The popliteal neurovascular bundle is lateral to this mass. Soft tissues There is a moderate size Baker cyst at the knee. IMPRESSION: Heterogeneous soft tissue mass within the proximal medial head gastrocnemius measuring 2.9 x 2.7 x 5.7 cm. Imaging characteristics are nonspecific, though with features suggesting internal hemorrhage, and this is potentially a hematoma. However, biopsy is recommended to exclude soft tissue sarcoma. Electronically Signed   By: Maurine Simmering M.D.   On: 01/28/2022 18:28   DG Tibia/Fibula Right  Result Date: 01/21/2022 CLINICAL DATA:  Proximal calf mass. Worsening leg pain. No known  injury. EXAM: RIGHT TIBIA AND FIBULA - 2 VIEW COMPARISON:  Right lower extremity ultrasound 01/20/2022. Right foot series images 611 2018. FINDINGS: Degenerative changes noted about the right knee. Loose body noted. No acute bony or joint abnormality identified. Punctate soft tissue calcifications noted over the calf. These most likely dystrophic. IMPRESSION: 1. Degenerative changes noted about the right knee. Small loose body is noted. 2.  No acute bony or joint abnormality identified. Reference is made to right lower extremity ultrasound report. Electronically Signed   By: Marcello Moores  Register M.D.   On: 01/21/2022 07:20   DG Shoulder Right  Result Date: 01/17/2022 CLINICAL DATA:  Chronic right shoulder pain. History of rotator cuff surgery x2. No known injury. EXAM: RIGHT SHOULDER - 2+ VIEW COMPARISON:  07/24/2020. FINDINGS: There is no evidence of fracture or dislocation. There are mild degenerative changes in the glenohumeral joint and moderate to severe hypertrophic degenerative changes at the acromioclavicular joint. Soft tissues are unremarkable. IMPRESSION: Degenerative changes at the acromioclavicular and glenohumeral joints. Electronically Signed   By: Brett Fairy M.D.   On: 01/17/2022 21:49    Labs:  CBC: No results for input(s): "WBC", "HGB", "HCT", "PLT" in the last 8760 hours.  COAGS: No results for input(s): "INR", "APTT" in the last 8760 hours.    Assessment and Plan: Right calf mass For US guided aspiration/biopsy Risks and benefits of right calf mass biopsy was discussed with the patient and/or patient's family including, but not limited to bleeding, infection, damage to adjacent structures or low yield requiring additional tests.  All of the questions were answered and there is agreement to proceed.  Consent signed and in chart.    Electronically Signed: Ascencion Dike, PA-C 02/12/2022, 8:24 AM   I spent a total of 20 in face to face in clinical consultation, greater  than 50% of which was counseling/coordinating care for calf bx

## 2022-02-12 NOTE — Progress Notes (Signed)
Client ate rice cereal and drank orange juice this am and keving bruning notified

## 2022-02-13 LAB — SURGICAL PATHOLOGY

## 2022-02-26 ENCOUNTER — Ambulatory Visit: Payer: PPO | Admitting: Family Medicine

## 2022-02-27 DIAGNOSIS — Z1231 Encounter for screening mammogram for malignant neoplasm of breast: Secondary | ICD-10-CM | POA: Diagnosis not present

## 2022-02-27 DIAGNOSIS — Z6820 Body mass index (BMI) 20.0-20.9, adult: Secondary | ICD-10-CM | POA: Diagnosis not present

## 2022-02-27 DIAGNOSIS — Z01419 Encounter for gynecological examination (general) (routine) without abnormal findings: Secondary | ICD-10-CM | POA: Diagnosis not present

## 2022-02-27 DIAGNOSIS — N952 Postmenopausal atrophic vaginitis: Secondary | ICD-10-CM | POA: Diagnosis not present

## 2022-03-19 DIAGNOSIS — H353131 Nonexudative age-related macular degeneration, bilateral, early dry stage: Secondary | ICD-10-CM | POA: Diagnosis not present

## 2022-03-19 DIAGNOSIS — Z79899 Other long term (current) drug therapy: Secondary | ICD-10-CM | POA: Diagnosis not present

## 2022-03-19 DIAGNOSIS — H40013 Open angle with borderline findings, low risk, bilateral: Secondary | ICD-10-CM | POA: Diagnosis not present

## 2022-03-19 DIAGNOSIS — H35033 Hypertensive retinopathy, bilateral: Secondary | ICD-10-CM | POA: Diagnosis not present

## 2022-04-09 DIAGNOSIS — M0609 Rheumatoid arthritis without rheumatoid factor, multiple sites: Secondary | ICD-10-CM | POA: Diagnosis not present

## 2022-04-09 DIAGNOSIS — M1991 Primary osteoarthritis, unspecified site: Secondary | ICD-10-CM | POA: Diagnosis not present

## 2022-04-09 DIAGNOSIS — Z6821 Body mass index (BMI) 21.0-21.9, adult: Secondary | ICD-10-CM | POA: Diagnosis not present

## 2022-04-09 DIAGNOSIS — Z79899 Other long term (current) drug therapy: Secondary | ICD-10-CM | POA: Diagnosis not present

## 2022-05-30 DIAGNOSIS — M79605 Pain in left leg: Secondary | ICD-10-CM | POA: Diagnosis not present

## 2022-06-04 DIAGNOSIS — I10 Essential (primary) hypertension: Secondary | ICD-10-CM | POA: Diagnosis not present

## 2022-06-04 DIAGNOSIS — M069 Rheumatoid arthritis, unspecified: Secondary | ICD-10-CM | POA: Diagnosis not present

## 2022-06-04 DIAGNOSIS — M79652 Pain in left thigh: Secondary | ICD-10-CM | POA: Diagnosis not present

## 2022-06-04 DIAGNOSIS — F325 Major depressive disorder, single episode, in full remission: Secondary | ICD-10-CM | POA: Diagnosis not present

## 2022-06-04 DIAGNOSIS — N1831 Chronic kidney disease, stage 3a: Secondary | ICD-10-CM | POA: Diagnosis not present

## 2022-06-17 DIAGNOSIS — M1612 Unilateral primary osteoarthritis, left hip: Secondary | ICD-10-CM | POA: Diagnosis not present

## 2022-07-15 DIAGNOSIS — N1831 Chronic kidney disease, stage 3a: Secondary | ICD-10-CM | POA: Diagnosis not present

## 2022-07-15 DIAGNOSIS — G43009 Migraine without aura, not intractable, without status migrainosus: Secondary | ICD-10-CM | POA: Diagnosis not present

## 2022-07-15 DIAGNOSIS — M069 Rheumatoid arthritis, unspecified: Secondary | ICD-10-CM | POA: Diagnosis not present

## 2022-07-15 DIAGNOSIS — F325 Major depressive disorder, single episode, in full remission: Secondary | ICD-10-CM | POA: Diagnosis not present

## 2022-07-21 ENCOUNTER — Telehealth: Payer: Self-pay | Admitting: Family Medicine

## 2022-07-21 DIAGNOSIS — K1370 Unspecified lesions of oral mucosa: Secondary | ICD-10-CM | POA: Diagnosis not present

## 2022-07-22 NOTE — Telephone Encounter (Signed)
error 

## 2022-08-27 DIAGNOSIS — M545 Low back pain, unspecified: Secondary | ICD-10-CM | POA: Diagnosis not present

## 2022-08-27 DIAGNOSIS — M25552 Pain in left hip: Secondary | ICD-10-CM | POA: Diagnosis not present

## 2022-09-03 DIAGNOSIS — H353131 Nonexudative age-related macular degeneration, bilateral, early dry stage: Secondary | ICD-10-CM | POA: Diagnosis not present

## 2022-09-12 DIAGNOSIS — M1612 Unilateral primary osteoarthritis, left hip: Secondary | ICD-10-CM | POA: Diagnosis not present

## 2022-09-12 DIAGNOSIS — M25552 Pain in left hip: Secondary | ICD-10-CM | POA: Diagnosis not present

## 2022-09-12 DIAGNOSIS — Z01812 Encounter for preprocedural laboratory examination: Secondary | ICD-10-CM | POA: Diagnosis not present

## 2022-09-23 DIAGNOSIS — H353132 Nonexudative age-related macular degeneration, bilateral, intermediate dry stage: Secondary | ICD-10-CM | POA: Diagnosis not present

## 2022-09-23 DIAGNOSIS — Z79899 Other long term (current) drug therapy: Secondary | ICD-10-CM | POA: Diagnosis not present

## 2022-09-23 DIAGNOSIS — H43813 Vitreous degeneration, bilateral: Secondary | ICD-10-CM | POA: Diagnosis not present

## 2022-09-23 DIAGNOSIS — Z961 Presence of intraocular lens: Secondary | ICD-10-CM | POA: Diagnosis not present

## 2022-09-23 DIAGNOSIS — M0520 Rheumatoid vasculitis with rheumatoid arthritis of unspecified site: Secondary | ICD-10-CM | POA: Diagnosis not present

## 2022-10-01 ENCOUNTER — Encounter (HOSPITAL_COMMUNITY): Payer: Self-pay

## 2022-10-06 ENCOUNTER — Other Ambulatory Visit: Payer: Self-pay

## 2022-10-06 ENCOUNTER — Encounter (HOSPITAL_COMMUNITY): Payer: Self-pay

## 2022-10-06 ENCOUNTER — Encounter (HOSPITAL_COMMUNITY)
Admission: RE | Admit: 2022-10-06 | Discharge: 2022-10-06 | Disposition: A | Discharge status: Home / Self Care | Payer: PPO | Source: Ambulatory Visit | Attending: Orthopedic Surgery | Admitting: Orthopedic Surgery

## 2022-10-06 VITALS — BP 139/73 | HR 81 | Temp 97.7°F | Resp 16 | Ht 66.5 in | Wt 131.0 lb

## 2022-10-06 DIAGNOSIS — Z01818 Encounter for other preprocedural examination: Secondary | ICD-10-CM | POA: Insufficient documentation

## 2022-10-06 HISTORY — DX: Headache, unspecified: R51.9

## 2022-10-06 HISTORY — DX: Pneumonia, unspecified organism: J18.9

## 2022-10-06 HISTORY — DX: Nausea with vomiting, unspecified: R11.2

## 2022-10-06 HISTORY — DX: Other specified postprocedural states: Z98.890

## 2022-10-06 LAB — BASIC METABOLIC PANEL
Anion gap: 8 (ref 5–15)
BUN: 26 mg/dL — ABNORMAL HIGH (ref 8–23)
CO2: 26 mmol/L (ref 22–32)
Calcium: 9.2 mg/dL (ref 8.9–10.3)
Chloride: 106 mmol/L (ref 98–111)
Creatinine, Ser: 1.21 mg/dL — ABNORMAL HIGH (ref 0.44–1.00)
GFR, Estimated: 46 mL/min — ABNORMAL LOW (ref >60)
Glucose, Bld: 97 mg/dL (ref 70–99)
Potassium: 4 mmol/L (ref 3.5–5.1)
Sodium: 140 mmol/L (ref 135–145)

## 2022-10-06 LAB — CBC
HCT: 42.1 % (ref 36.0–46.0)
Hemoglobin: 13.8 g/dL (ref 12.0–15.0)
MCH: 29.3 pg (ref 26.0–34.0)
MCHC: 32.8 g/dL (ref 30.0–36.0)
MCV: 89.4 fL (ref 80.0–100.0)
Platelets: 213 10*3/uL (ref 150–400)
RBC: 4.71 MIL/uL (ref 3.87–5.11)
RDW: 13.2 % (ref 11.5–15.5)
WBC: 7.3 10*3/uL (ref 4.0–10.5)
nRBC: 0 % (ref 0.0–0.2)

## 2022-10-06 LAB — SURGICAL PCR SCREEN
MRSA, PCR: NEGATIVE
Staphylococcus aureus: POSITIVE — AB

## 2022-10-06 NOTE — Progress Notes (Addendum)
Anesthesia Review:  PCP: Jonette Mate  Cardiologist : one  Rheumatologist- Orlin Hilding- appt on 10/09/22  Chest x-ray : EKG : 10/06/22  Echo : Stress test: Cardiac Cath :  Activity level: can do a flight of stairs without difficutly  Sleep Study/ CPAP : none  Fasting Blood Sugar :      / Checks Blood Sugar -- times a day:   Blood Thinner/ Instructions /Last Dose: ASA / Instructions/ Last Dose :    81 mg aspirin    No orders in epic at time of preop appt.    Pt has numerous allergies to anesthesia meds and has uncontrollable shaking after surgeries.  Christeen Douglas, [facility] made aware .  List of allergies given to [facility]  Jessicia Ward, PAC saw pt at preop appt.

## 2022-10-06 NOTE — Patient Instructions (Addendum)
SURGICAL WAITING ROOM VISITATION  Patients having surgery or a procedure may have no more than 2 support people in the waiting area - these visitors may rotate.    Children under the age of 22 must have an adult with them who is not the patient.  Due to an increase in RSV and influenza rates and associated hospitalizations, children ages 37 and under may not visit patients in [facility] hospitals.  If the patient needs to stay at the hospital during part of their recovery, the visitor guidelines for inpatient rooms apply. Pre-op nurse will coordinate an appropriate time for 1 support person to accompany patient in pre-op.  This support person may not rotate.    Please refer to the [facility] website for the visitor guidelines for Inpatients (after your surgery is over and you are in a regular room).       Your procedure is scheduled on:  10/14/2022    Report to [facility] Main Entrance    Report to admitting at  1100 AM   Call this number if you have problems the morning of surgery [phone]   Do not eat food or drink liquids  :After Midnight.                             If you have questions, please contact your surgeon's office.        Oral Hygiene is also important to reduce your risk of infection.                                    Remember - BRUSH YOUR TEETH THE MORNING OF SURGERY WITH YOUR REGULAR TOOTHPASTE  DENTURES WILL BE REMOVED PRIOR TO SURGERY PLEASE DO NOT APPLY "Poly grip" OR ADHESIVES!!!   Do NOT smoke after Midnight   Take these medicines the morning of surgery with A SIP OF WATER:  amlodipine   DO NOT TAKE ANY ORAL DIABETIC MEDICATIONS DAY OF YOUR SURGERY  Bring CPAP mask and tubing day of surgery.                              You may not have any metal on your body including hair pins, jewelry, and body piercing             Do not wear make-up, lotions, powders, perfumes/cologne, or deodorant  Do not wear nail polish including gel  and S&S, artificial/acrylic nails, or any other type of covering on natural nails including finger and toenails. If you have artificial nails, gel coating, etc. that needs to be removed by a nail salon please have this removed prior to surgery or surgery may need to be canceled/ delayed if the surgeon/ anesthesia feels like they are unable to be safely monitored.   Do not shave  48 hours prior to surgery.               Men may shave face and neck.   Do not bring valuables to the hospital. [facility] IS NOT             RESPONSIBLE   FOR VALUABLES.   Contacts, glasses, dentures or bridgework may not be worn into surgery.   Bring small overnight bag day of surgery.   DO NOT BRING YOUR HOME MEDICATIONS TO THE HOSPITAL.  PHARMACY WILL DISPENSE MEDICATIONS LISTED ON YOUR MEDICATION LIST TO YOU DURING YOUR ADMISSION IN THE HOSPITAL!    Patients discharged on the day of surgery will not be allowed to drive home.  Someone NEEDS to stay with you for the first 24 hours after anesthesia.   Special Instructions: Bring a copy of your healthcare power of attorney and living will documents the day of surgery if you haven't scanned them before.              Please read over the following fact sheets you were given: IF YOU HAVE QUESTIONS ABOUT YOUR PRE-OP INSTRUCTIONS PLEASE CALL [phone]   If you received a COVID test during your pre-op visit  it is requested that you wear a mask when out in public, stay away from anyone that may not be feeling well and notify your surgeon if you develop symptoms. If you test positive for Covid or have been in contact with anyone that has tested positive in the last 10 days please notify you surgeon.      Pre-operative 5 CHG Bath Instructions   You can play a key role in reducing the risk of infection after surgery. Your skin needs to be as free of germs as possible. You can reduce the number of germs on your skin by washing with CHG (chlorhexidine gluconate) soap  before surgery. CHG is an antiseptic soap that kills germs and continues to kill germs even after washing.   DO NOT use if you have an allergy to chlorhexidine/CHG or antibacterial soaps. If your skin becomes reddened or irritated, stop using the CHG and notify one of our RNs at [phone]   Please shower with the CHG soap starting 4 days before surgery using the following schedule:     Please keep in mind the following:  DO NOT shave, including legs and underarms, starting the day of your first shower.   You may shave your face at any point before/day of surgery.  Place clean sheets on your bed the day you start using CHG soap. Use a clean washcloth (not used since being washed) for each shower. DO NOT sleep with pets once you start using the CHG.   CHG Shower Instructions:  If you choose to wash your hair and private area, wash first with your normal shampoo/soap.  After you use shampoo/soap, rinse your hair and body thoroughly to remove shampoo/soap residue.  Turn the water OFF and apply about 3 tablespoons (45 ml) of CHG soap to a CLEAN washcloth.  Apply CHG soap ONLY FROM YOUR NECK DOWN TO YOUR TOES (washing for 3-5 minutes)  DO NOT use CHG soap on face, private areas, open wounds, or sores.  Pay special attention to the area where your surgery is being performed.  If you are having back surgery, having someone wash your back for you may be helpful. Wait 2 minutes after CHG soap is applied, then you may rinse off the CHG soap.  Pat dry with a clean towel  Put on clean clothes/pajamas   If you choose to wear lotion, please use ONLY the CHG-compatible lotions on the back of this paper.     Additional instructions for the day of surgery: DO NOT APPLY any lotions, deodorants, cologne, or perfumes.   Put on clean/comfortable clothes.  Brush your teeth.  Ask your nurse before applying any prescription medications to the skin.      CHG Compatible Lotions   Aveeno Moisturizing  lotion  Cetaphil Moisturizing  Cream  Cetaphil Moisturizing Lotion  Clairol Herbal Essence Moisturizing Lotion, Dry Skin  Clairol Herbal Essence Moisturizing Lotion, Extra Dry Skin  Clairol Herbal Essence Moisturizing Lotion, Normal Skin  Curel Age Defying Therapeutic Moisturizing Lotion with Alpha Hydroxy  Curel Extreme Care Body Lotion  Curel Soothing Hands Moisturizing Hand Lotion  Curel Therapeutic Moisturizing Cream, Fragrance-Free  Curel Therapeutic Moisturizing Lotion, Fragrance-Free  Curel Therapeutic Moisturizing Lotion, Original Formula  Eucerin Daily Replenishing Lotion  Eucerin Dry Skin Therapy Plus Alpha Hydroxy Crme  Eucerin Dry Skin Therapy Plus Alpha Hydroxy Lotion  Eucerin Original Crme  Eucerin Original Lotion  Eucerin Plus Crme Eucerin Plus Lotion  Eucerin TriLipid Replenishing Lotion  Keri Anti-Bacterial Hand Lotion  Keri Deep Conditioning Original Lotion Dry Skin Formula Softly Scented  Keri Deep Conditioning Original Lotion, Fragrance Free Sensitive Skin Formula  Keri Lotion Fast Absorbing Fragrance Free Sensitive Skin Formula  Keri Lotion Fast Absorbing Softly Scented Dry Skin Formula  Keri Original Lotion  Keri Skin Renewal Lotion Keri Silky Smooth Lotion  Keri Silky Smooth Sensitive Skin Lotion  Nivea Body Creamy Conditioning Oil  Nivea Body Extra Enriched Teacher, adult education Moisturizing Lotion Nivea Crme  Nivea Skin Firming Lotion  NutraDerm 30 Skin Lotion  NutraDerm Skin Lotion  NutraDerm Therapeutic Skin Cream  NutraDerm Therapeutic Skin Lotion  ProShield Protective Hand Cream  Provon moisturizing lotion

## 2022-10-07 NOTE — Progress Notes (Signed)
Anesthesia Chart Review   Case: [id number] Date/Time: 10/14/22 1315   Procedure: TOTAL HIP ARTHROPLASTY ANTERIOR APPROACH (Left: Hip)   Anesthesia type: Choice   Pre-op diagnosis: OA LEFT HIP   Location: [facility] ROOM 07 / [facility]   Surgeons: Sheral Apley, MD       DISCUSSION:77 y.o. never smoker with h/o PONV, HTN, CKD Stage III, left hip OA scheduled for above procedure 10/14/2022 with Dr. Margarita Rana.   Pt brings in a list of allergies (on chart) which include:  Zofran, propofol, fentanyl, versed, sevoflurane, diprivan, midazolam.   She reports when she has these meds she has violent shaking.  She has had multiple procedures at [facility].  Per anesthesia note 10/15/2016, "Due to history of adverse reaction (prolonged shaking) following anesthesia, the patient has been instructed to avoid Propofol and Versed. She and her medical records show that mask inductions have worked well and we will plan that for today. "  She did receive sevoflurane, fentanyl, and zofran during this procedure.  Discussed with patient and she reports she had no complications with this procedure.    Per 06/12/2015 anesthesia note, "Pt had regional block with inhalational induction without versed or propofol for last anesthetic and did not have any reaction. She would prefer the same anesthetic today."  Per 05/11/2014 anesthesia note, "Pt began having jerking movements in her arms and legs that she has experienced after previous surgeries. Unknown what the actual cause is but could be a reaction caused by Propofol and/or Versed. It is a dystonic type of behavior and has improved over several hours and is now limited to her legs. It is completely involuntary and she understands what is happening. Due to the jerking, she is unable to walk on her own and cannot be sent home safely. I have recommended that she at least be admitted overnight here at [facility] as these effects are usually time limited as the drugs are metabolized. If she is  not improved in the morning, we will have her admitted to the hospital. Dr. Merlyn Lot is aware and agrees."   VS: BP 139/73   Pulse 81   Temp 36.5 C (Oral)   Resp 16   Ht 5' 6.5" (1.689 m)   Wt 59.4 kg   SpO2 100%   BMI 20.83 kg/m   PROVIDERS: Shon Hale, MD is PCP    LABS: Labs reviewed: Acceptable for surgery. (all labs ordered are listed, but only abnormal results are displayed)  Labs Reviewed  SURGICAL PCR SCREEN - Abnormal; Notable for the following components:      Result Value   Staphylococcus aureus POSITIVE (*)    All other components within normal limits  BASIC METABOLIC PANEL - Abnormal; Notable for the following components:   BUN 26 (*)    Creatinine, Ser 1.21 (*)    GFR, Estimated 46 (*)    All other components within normal limits  CBC  TYPE AND SCREEN     IMAGES:   EKG:   CV:  Past Medical History:  Diagnosis Date   Arthritis    CKD (chronic kidney disease), stage III ([facility])    Complication of anesthesia 05/2014   had body shaking-observed over night-see anesthesia note-has had tremors post op in past   Headache    Hyperlipemia    Hypertension    Insomnia    Pneumonia    as a child - asiatic pneumonia age 48   PONV (postoperative nausea and vomiting)  Past Surgical History:  Procedure Laterality Date   ABDOMINAL HYSTERECTOMY     age 13   BACK SURGERY     CARPAL TUNNEL RELEASE  01/20/2012   Procedure: CARPAL TUNNEL RELEASE;  Surgeon: Nicki Reaper, MD;  Location: [facility];  Service: Orthopedics;  Laterality: Right;   CARPAL TUNNEL RELEASE  02/25/2012   Procedure: CARPAL TUNNEL RELEASE;  Surgeon: Nicki Reaper, MD;  Location: [facility];  Service: Orthopedics;  Laterality: Left;  RELEASE A-1 PULLEY LRF   COLONOSCOPY WITH PROPOFOL N/A 10/15/2016   Procedure: COLONOSCOPY WITH PROPOFOL;  Surgeon: Charlott Rakes, MD;  Location: [facility];  Service: Endoscopy;  Laterality: N/A;   DISTAL BICEPS  TENDON REPAIR Left 06/12/2015   Procedure: REPAIR LEFT BICEPS TENDON AND DECOMPRESSION OF MEDIAN NERVE;  Surgeon: Cindee Salt, MD;  Location: [facility];  Service: Orthopedics;  Laterality: Left;   right shoulder rotator cuff surgery      x 2   SMALL INTESTINE SURGERY     SMALL INTESTINE SURGERY     has had 6 total bowel obstructions -mostlt adhesions-last 2009   TONSILLECTOMY     age 24   TRIGGER FINGER RELEASE  06/05/2010   rt middle   TRIGGER FINGER RELEASE  05/05/2008   lt middle   ULNAR NERVE TRANSPOSITION Right 05/11/2014   Procedure: DECOMPRESSION ULNAR NERVE RIGHT ELBOW ;  Surgeon: Cindee Salt, MD;  Location: [facility];  Service: Orthopedics;  Laterality: Right;   ULNAR NERVE TRANSPOSITION Left 07/06/2014   Procedure: DECOMPRESSION  LEFT ULNAR NERVE;  Surgeon: Cindee Salt, MD;  Location: [facility];  Service: Orthopedics;  Laterality: Left;    MEDICATIONS:  alendronate (FOSAMAX) 10 MG tablet   AMBULATORY NON FORMULARY MEDICATION   amLODipine (NORVASC) 2.5 MG tablet   aspirin EC 81 MG tablet   Brimonidine Tartrate (LUMIFY) 0.025 % SOLN   cyanocobalamin (VITAMIN B12) 500 MCG tablet   estradiol (ESTRACE) 0.1 MG/GM vaginal cream   hydroxychloroquine (PLAQUENIL) 200 MG tablet   Multiple Vitamin (MULTIVITAMIN WITH MINERALS) TABS tablet   Multiple Vitamins-Minerals (PRESERVISION AREDS 2) CAPS   TART CHERRY PO   tiZANidine (ZANAFLEX) 4 MG tablet   traZODone (DESYREL) 50 MG tablet   Turmeric Curcumin 500 MG CAPS   valsartan (DIOVAN) 160 MG tablet   Zinc 50 MG TABS   No current facility-administered medications for this encounter.     Jodell Cipro Ward, PA-C [facility]Surgical Testing [phone]

## 2022-10-07 NOTE — H&P (Signed)
HIP ARTHROPLASTY ADMISSION H&P  Patient ID: Gloria [name] MRN: [id number] DOB/AGE: 1946/01/18 77 y.o.  Chief Complaint: left hip pain.  Planned Procedure Date: 10/14/22 Medical and Cardiac Clearance by Dr. Garth Bigness   Rheumatology clearance by Dr. Zenovia Jordan   HPI: [name] is a 77 y.o. female who presents for evaluation of OA LEFT HIP. The patient has a history of pain and functional disability in the left hip due to arthritis and has failed non-surgical conservative treatments for greater than 12 weeks to include NSAID's and/or analgesics, corticosteriod injections, and activity modification.  Onset of symptoms was gradual, starting 1 years ago with rapidlly worsening course since that time. The patient noted no past surgery on the left hip.  Patient currently rates pain at 6 out of 10 with activity. Patient has night pain, worsening of pain with activity and weight bearing, and pain that interferes with activities of daily living.  Patient has evidence of subchondral sclerosis, periarticular osteophytes, and joint space narrowing by imaging studies.  There is no active infection.  Past Medical History:  Diagnosis Date   Arthritis    CKD (chronic kidney disease), stage III (HCC)    Complication of anesthesia 05/2014   had body shaking-observed over night-see anesthesia note-has had tremors post op in past   Headache    Hyperlipemia    Hypertension    Insomnia    Pneumonia    as a child - asiatic pneumonia age 4   PONV (postoperative nausea and vomiting)    Past Surgical History:  Procedure Laterality Date   ABDOMINAL HYSTERECTOMY     age 89   BACK SURGERY     CARPAL TUNNEL RELEASE  01/20/2012   Procedure: CARPAL TUNNEL RELEASE;  Surgeon: Nicki Reaper, MD;  Location: [facility];  Service: Orthopedics;  Laterality: Right;   CARPAL TUNNEL RELEASE  02/25/2012   Procedure: CARPAL TUNNEL RELEASE;  Surgeon: Nicki Reaper, MD;  Location: [facility];  Service: Orthopedics;  Laterality: Left;  RELEASE A-1 PULLEY LRF   COLONOSCOPY WITH PROPOFOL N/A 10/15/2016   Procedure: COLONOSCOPY WITH PROPOFOL;  Surgeon: Charlott Rakes, MD;  Location: [facility];  Service: Endoscopy;  Laterality: N/A;   DISTAL BICEPS TENDON REPAIR Left 06/12/2015   Procedure: REPAIR LEFT BICEPS TENDON AND DECOMPRESSION OF MEDIAN NERVE;  Surgeon: Cindee Salt, MD;  Location: [facility];  Service: Orthopedics;  Laterality: Left;   right shoulder rotator cuff surgery      x 2   SMALL INTESTINE SURGERY     SMALL INTESTINE SURGERY     has had 6 total bowel obstructions -mostlt adhesions-last 2009   TONSILLECTOMY     age 21   TRIGGER FINGER RELEASE  06/05/2010   rt middle   TRIGGER FINGER RELEASE  05/05/2008   lt middle   ULNAR NERVE TRANSPOSITION Right 05/11/2014   Procedure: DECOMPRESSION ULNAR NERVE RIGHT ELBOW ;  Surgeon: Cindee Salt, MD;  Location: [facility];  Service: Orthopedics;  Laterality: Right;   ULNAR NERVE TRANSPOSITION Left 07/06/2014   Procedure: DECOMPRESSION  LEFT ULNAR NERVE;  Surgeon: Cindee Salt, MD;  Location: [facility];  Service: Orthopedics;  Laterality: Left;   Allergies  Allergen Reactions   Versed [Midazolam] Other (See Comments)    Uncontrolled shaking   Diprivan [Propofol] Other (See Comments)    Uncontrolled shaking   Fentanyl Other (See Comments)    Uncontrolled shaking   Gabapentin Other (  See Comments)    Pt has age related macular degeneration     Sevoflurane Other (See Comments)    Uncontrolled shaking   Zofran [Ondansetron Hcl] Other (See Comments)    Uncontrolled shaking   Prior to Admission medications   Medication Sig Start Date End Date Taking? Authorizing Provider  alendronate (FOSAMAX) 10 MG tablet Take 10 mg by mouth daily before breakfast. Take with a full glass of water on an empty stomach.   Yes [provider]  amLODipine (NORVASC) 2.5 MG  tablet Take 2.5 mg by mouth daily.   Yes [provider]  aspirin EC 81 MG tablet Take 81 mg by mouth daily. Swallow whole.   Yes [provider]  Brimonidine Tartrate (LUMIFY) 0.025 % SOLN Place 1 drop into both eyes daily as needed (redness).   Yes [provider]  cyanocobalamin (VITAMIN B12) 500 MCG tablet Take 500 mcg by mouth daily.   Yes [provider]  estradiol (ESTRACE) 0.1 MG/GM vaginal cream Place 1 Applicatorful vaginally 2 (two) times a week.   Yes [provider]  hydroxychloroquine (PLAQUENIL) 200 MG tablet Take 400 mg by mouth daily.    Yes [provider]  Multiple Vitamin (MULTIVITAMIN WITH MINERALS) TABS tablet Take 1 tablet by mouth daily.   Yes [provider]  Multiple Vitamins-Minerals (PRESERVISION AREDS 2) CAPS Take 1 capsule by mouth 2 (two) times daily.   Yes [provider]  TART CHERRY PO Take 10,500 mg by mouth daily.   Yes [provider]  tiZANidine (ZANAFLEX) 4 MG tablet Take 4-8 mg by mouth at bedtime as needed for muscle spasms. 01/06/20  Yes [provider]  traZODone (DESYREL) 50 MG tablet Take 50-150 mg by mouth at bedtime as needed for sleep.   Yes [provider]  Turmeric Curcumin 500 MG CAPS Take 500 mg by mouth daily.   Yes [provider]  valsartan (DIOVAN) 160 MG tablet Take 160 mg by mouth daily.   Yes [provider]  Zinc 50 MG TABS Take 50 mg by mouth daily.   Yes [provider]  AMBULATORY NON FORMULARY MEDICATION Dan Humphreys Disp 1 01/20/22   Rodolph Bong, MD   Social History   Socioeconomic History   Marital status: Single    Spouse name: Not on file   Number of children: Not on file   Years of education: Not on file   Highest education level: Not on file  Occupational History   Not on file  Tobacco Use   Smoking status: Never   Smokeless tobacco: Never  Vaping Use   Vaping Use: Never used  Substance and Sexual  Activity   Alcohol use: No   Drug use: No   Sexual activity: Not on file  Other Topics Concern   Not on file  Social History Narrative   Not on file   Social Determinants of Health   Financial Resource Strain: Not on file  Food Insecurity: Not on file  Transportation Needs: Not on file  Physical Activity: Not on file  Stress: Not on file  Social Connections: Not on file   Family History  Problem Relation Age of Onset   Heart failure Father     ROS: Currently denies lightheadedness, dizziness, Fever, chills, CP, SOB.   No personal history of DVT, PE, MI, or CVA. No loose teeth or dentures All other systems have been reviewed and were otherwise currently negative with the exception of those mentioned  in the HPI and as above.  Objective: Vitals: Ht: 5'6" Wt: 133 lbs Temp: 97.8 BP: 174/75 Pulse: 80 O2 98% on room air.   Physical Exam: General: Alert, NAD. Trendelenberg Gait  HEENT: EOMI, Good Neck Extension  Pulm: No increased work of breathing.  Clear B/L A/P w/o crackle or wheeze.  CV: RRR, No m/g/r appreciated  GI: soft, NT, ND. BS x 4 quadrants Neuro: CN II-XII grossly intact without focal deficit.  Sensation intact distally Skin: No lesions in the area of chief complaint MSK/Surgical Site: + TTP. Hip ROM decreased d/t pain. + Stinchfield. - SLR. - FABER/FADIR. Decreased strength.  NVI.    Imaging Review Plain radiographs demonstrate severe degenerative joint disease of the left hip.   The bone quality appears to be fair for age and reported activity level.  Preoperative templating of the joint replacement has been completed, documented, and submitted to the Operating Room personnel in order to optimize intra-operative equipment management.  Assessment: OA LEFT HIP Active Problems:   * No active hospital problems. *   Plan: Plan for Procedure(s): TOTAL HIP ARTHROPLASTY ANTERIOR APPROACH  The patient history, physical exam, clinical judgement of the provider  and imaging are consistent with end stage degenerative joint disease and total joint arthroplasty is deemed medically necessary. The treatment options including medical management, injection therapy, and arthroplasty were discussed at length. The risks and benefits of Procedure(s): TOTAL HIP ARTHROPLASTY ANTERIOR APPROACH were presented and reviewed.  The risks of nonoperative treatment, versus surgical intervention including but not limited to continued pain, aseptic loosening, stiffness, dislocation/subluxation, infection, bleeding, nerve injury, blood clots, cardiopulmonary complications, morbidity, mortality, among others were discussed. The patient verbalizes understanding and wishes to proceed with the plan.  Patient is being admitted for surgery, pain control, PT, prophylactic antibiotics, VTE prophylaxis, progressive ambulation, ADL's and discharge planning. She will spend the night in observation.  Dental prophylaxis discussed and recommended for 2 years postoperatively.  The patient does meet the criteria for TXA which will be used perioperatively.   ASA 81 mg BID will be used postoperatively for DVT prophylaxis in addition to SCDs, and early ambulation. Plan for Oxycodone and Tylenol for pain.  No NSAIDs due to CKD. Robaxin for muscle spasm.  Reglan or Phenergan for nausea and vomiting. Senokot for constipation prevention Pharmacy- [facility] [address] The patient is planning to be discharged home with OPPT and into the care of her boyfriend Karl Bales who can be reached at [phone] Follow up appt 10/29/22 at 4:15pm     Jenne Pane, PA-C Office [phone] 10/07/2022 1:22 PM

## 2022-10-09 DIAGNOSIS — M1991 Primary osteoarthritis, unspecified site: Secondary | ICD-10-CM | POA: Diagnosis not present

## 2022-10-09 DIAGNOSIS — M0609 Rheumatoid arthritis without rheumatoid factor, multiple sites: Secondary | ICD-10-CM | POA: Diagnosis not present

## 2022-10-09 DIAGNOSIS — Z79899 Other long term (current) drug therapy: Secondary | ICD-10-CM | POA: Diagnosis not present

## 2022-10-09 DIAGNOSIS — Z6820 Body mass index (BMI) 20.0-20.9, adult: Secondary | ICD-10-CM | POA: Diagnosis not present

## 2022-10-12 NOTE — Care Plan (Signed)
Ortho Bundle Case Management Note  Patient Details  Name: AKARI DEFELICE MRN: [id number] Date of Birth: [birth year]    met with patient in the office for H&P. she will discharge to home with friend to assist. has equipment at home. OPPT set up with [facility]. discharge instructions discussed and questions answered.                 DME Arranged:    DME Agency:     HH Arranged:    HH Agency:     Additional Comments: Please contact me with any questions of if this plan should need to change.  Shauna Hugh,  RN,BSN,MHA,CCM  [facility] Orthopaedic Specialist  [phone] 10/12/2022, 10:11 PM

## 2022-10-14 ENCOUNTER — Other Ambulatory Visit: Payer: Self-pay

## 2022-10-14 ENCOUNTER — Ambulatory Visit (HOSPITAL_COMMUNITY): Payer: PPO | Admitting: Physician Assistant

## 2022-10-14 ENCOUNTER — Encounter (HOSPITAL_COMMUNITY)
Admission: RE | Disposition: A | Discharge status: Home / Self Care | Payer: Self-pay | Source: Home / Self Care | Attending: Orthopedic Surgery

## 2022-10-14 ENCOUNTER — Ambulatory Visit (HOSPITAL_COMMUNITY): Payer: PPO

## 2022-10-14 ENCOUNTER — Ambulatory Visit (HOSPITAL_BASED_OUTPATIENT_CLINIC_OR_DEPARTMENT_OTHER): Payer: PPO | Admitting: Anesthesiology

## 2022-10-14 ENCOUNTER — Encounter (HOSPITAL_COMMUNITY): Payer: Self-pay | Admitting: Orthopedic Surgery

## 2022-10-14 ENCOUNTER — Observation Stay (HOSPITAL_COMMUNITY)
Admission: RE | Admit: 2022-10-14 | Discharge: 2022-10-15 | Disposition: A | Discharge status: Home / Self Care | Payer: PPO | Attending: Orthopedic Surgery | Admitting: Orthopedic Surgery

## 2022-10-14 DIAGNOSIS — M1612 Unilateral primary osteoarthritis, left hip: Principal | ICD-10-CM | POA: Insufficient documentation

## 2022-10-14 DIAGNOSIS — Z7982 Long term (current) use of aspirin: Secondary | ICD-10-CM | POA: Diagnosis not present

## 2022-10-14 DIAGNOSIS — N183 Chronic kidney disease, stage 3 unspecified: Secondary | ICD-10-CM | POA: Diagnosis not present

## 2022-10-14 DIAGNOSIS — Z79899 Other long term (current) drug therapy: Secondary | ICD-10-CM | POA: Insufficient documentation

## 2022-10-14 DIAGNOSIS — Z96642 Presence of left artificial hip joint: Secondary | ICD-10-CM | POA: Diagnosis not present

## 2022-10-14 DIAGNOSIS — I129 Hypertensive chronic kidney disease with stage 1 through stage 4 chronic kidney disease, or unspecified chronic kidney disease: Secondary | ICD-10-CM | POA: Diagnosis not present

## 2022-10-14 DIAGNOSIS — Z01818 Encounter for other preprocedural examination: Secondary | ICD-10-CM

## 2022-10-14 DIAGNOSIS — Z471 Aftercare following joint replacement surgery: Secondary | ICD-10-CM | POA: Diagnosis not present

## 2022-10-14 HISTORY — PX: TOTAL HIP ARTHROPLASTY: SHX124

## 2022-10-14 LAB — TYPE AND SCREEN
ABO/RH(D): B POS
Antibody Screen: NEGATIVE

## 2022-10-14 LAB — ABO/RH: ABO/RH(D): B POS

## 2022-10-14 SURGERY — ARTHROPLASTY, HIP, TOTAL, ANTERIOR APPROACH
Anesthesia: Monitor Anesthesia Care | Site: Hip | Laterality: Left

## 2022-10-14 MED ORDER — LACTATED RINGERS IV BOLUS: 250.0000 mL | Freq: Once | INTRAVENOUS | Status: DC

## 2022-10-14 MED ORDER — OXYCODONE HCL 5 MG PO TABS
10.0000 mg | ORAL_TABLET | ORAL | Status: DC | PRN
  Administered 2022-10-14 – 2022-10-15 (×2): 10 mg via ORAL

## 2022-10-14 MED ORDER — ACETAMINOPHEN 500 MG PO TABS: 1000.0000 mg | ORAL_TABLET | Freq: Once | ORAL | Status: DC

## 2022-10-14 MED ORDER — CHLORHEXIDINE GLUCONATE 0.12 % MT SOLN
15.0000 mL | Freq: Once | OROMUCOSAL | Status: AC
  Administered 2022-10-14: 15 mL via OROMUCOSAL

## 2022-10-14 MED ORDER — MAGNESIUM CITRATE PO SOLN: 1.0000 | Freq: Once | ORAL | Status: DC | PRN

## 2022-10-14 MED ORDER — PHENOL 1.4 % MT LIQD: 1.0000 | OROMUCOSAL | Status: DC | PRN

## 2022-10-14 MED ORDER — SODIUM CHLORIDE (PF) 0.9 % IJ SOLN
INTRAMUSCULAR | Status: DC | PRN
  Administered 2022-10-14: 10 mL via INTRAVENOUS

## 2022-10-14 MED ORDER — SODIUM CHLORIDE (PF) 0.9 % IJ SOLN
INTRAMUSCULAR | Status: AC
  Filled 2022-10-14: qty 10

## 2022-10-14 MED ORDER — TRAZODONE HCL 50 MG PO TABS
50.0000 mg | ORAL_TABLET | Freq: Every evening | ORAL | Status: DC | PRN
  Administered 2022-10-14: 100 mg via ORAL
  Administered 2022-10-15: 50 mg via ORAL
  Filled 2022-10-14: qty 2
  Filled 2022-10-14 (×2): qty 1

## 2022-10-14 MED ORDER — METHOCARBAMOL 1000 MG/10ML IJ SOLN: 500.0000 mg | Freq: Four times a day (QID) | INTRAVENOUS | Status: DC | PRN

## 2022-10-14 MED ORDER — DEXAMETHASONE SODIUM PHOSPHATE 10 MG/ML IJ SOLN
8.0000 mg | Freq: Once | INTRAMUSCULAR | Status: AC
  Administered 2022-10-14: 8 mg via INTRAVENOUS

## 2022-10-14 MED ORDER — ACETAMINOPHEN 500 MG PO TABS
1000.0000 mg | ORAL_TABLET | Freq: Four times a day (QID) | ORAL | Status: AC
  Administered 2022-10-14 – 2022-10-15 (×4): 1000 mg via ORAL
  Filled 2022-10-14 (×4): qty 2

## 2022-10-14 MED ORDER — ASPIRIN 81 MG PO CHEW
81.0000 mg | CHEWABLE_TABLET | Freq: Two times a day (BID) | ORAL | Status: DC
  Administered 2022-10-14 – 2022-10-15 (×2): 81 mg via ORAL
  Filled 2022-10-14 (×2): qty 1

## 2022-10-14 MED ORDER — HYDROXYCHLOROQUINE SULFATE 200 MG PO TABS
400.0000 mg | ORAL_TABLET | Freq: Every day | ORAL | Status: DC
  Administered 2022-10-15: 400 mg via ORAL
  Filled 2022-10-14: qty 2

## 2022-10-14 MED ORDER — CEFAZOLIN SODIUM-DEXTROSE 1-4 GM/50ML-% IV SOLN
1.0000 g | Freq: Four times a day (QID) | INTRAVENOUS | Status: AC
  Administered 2022-10-14 – 2022-10-15 (×2): 1 g via INTRAVENOUS
  Filled 2022-10-14 (×2): qty 50

## 2022-10-14 MED ORDER — METOCLOPRAMIDE HCL 5 MG/ML IJ SOLN: 5.0000 mg | Freq: Three times a day (TID) | INTRAMUSCULAR | Status: DC | PRN

## 2022-10-14 MED ORDER — DEXMEDETOMIDINE HCL IN NACL 80 MCG/20ML IV SOLN
INTRAVENOUS | Status: DC | PRN
  Administered 2022-10-14: .4 ug/kg/h via INTRAVENOUS
  Administered 2022-10-14 (×3): 8 ug via INTRAVENOUS

## 2022-10-14 MED ORDER — BUPIVACAINE LIPOSOME 1.3 % IJ SUSP
INTRAMUSCULAR | Status: DC | PRN
  Administered 2022-10-14: 10 mL

## 2022-10-14 MED ORDER — DOCUSATE SODIUM 100 MG PO CAPS
100.0000 mg | ORAL_CAPSULE | Freq: Two times a day (BID) | ORAL | Status: DC
  Administered 2022-10-14 – 2022-10-15 (×2): 100 mg via ORAL
  Filled 2022-10-14 (×2): qty 1

## 2022-10-14 MED ORDER — BISACODYL 10 MG RE SUPP: 10.0000 mg | Freq: Every day | RECTAL | Status: DC | PRN

## 2022-10-14 MED ORDER — HYDROMORPHONE HCL 1 MG/ML IJ SOLN: 0.2500 mg | INTRAMUSCULAR | Status: DC | PRN

## 2022-10-14 MED ORDER — TRAMADOL HCL 50 MG PO TABS
50.0000 mg | ORAL_TABLET | Freq: Four times a day (QID) | ORAL | Status: DC
  Administered 2022-10-14 – 2022-10-15 (×4): 50 mg via ORAL
  Filled 2022-10-14 (×5): qty 1

## 2022-10-14 MED ORDER — TRANEXAMIC ACID-NACL 1000-0.7 MG/100ML-% IV SOLN
1000.0000 mg | Freq: Once | INTRAVENOUS | Status: AC
  Administered 2022-10-14: 1000 mg via INTRAVENOUS
  Filled 2022-10-14: qty 100

## 2022-10-14 MED ORDER — ACETAMINOPHEN 325 MG PO TABS: 325.0000 mg | ORAL_TABLET | Freq: Four times a day (QID) | ORAL | Status: DC | PRN

## 2022-10-14 MED ORDER — ONDANSETRON HCL 4 MG/2ML IJ SOLN
INTRAMUSCULAR | Status: AC
  Filled 2022-10-14: qty 4

## 2022-10-14 MED ORDER — ORAL CARE MOUTH RINSE: 15.0000 mL | Freq: Once | OROMUCOSAL | Status: AC

## 2022-10-14 MED ORDER — PANTOPRAZOLE SODIUM 40 MG PO TBEC
40.0000 mg | DELAYED_RELEASE_TABLET | Freq: Every day | ORAL | Status: DC
  Administered 2022-10-14 – 2022-10-15 (×2): 40 mg via ORAL
  Filled 2022-10-14 (×2): qty 1

## 2022-10-14 MED ORDER — WATER FOR IRRIGATION, STERILE IR SOLN
Status: DC | PRN
  Administered 2022-10-14: 2000 mL

## 2022-10-14 MED ORDER — KETAMINE HCL 10 MG/ML IJ SOLN
INTRAMUSCULAR | Status: DC | PRN
  Administered 2022-10-14: 20 mg via INTRAVENOUS
  Administered 2022-10-14: 10 mg via INTRAVENOUS

## 2022-10-14 MED ORDER — IRBESARTAN 150 MG PO TABS
150.0000 mg | ORAL_TABLET | Freq: Every day | ORAL | Status: DC
  Administered 2022-10-14 – 2022-10-15 (×2): 150 mg via ORAL
  Filled 2022-10-14 (×2): qty 1

## 2022-10-14 MED ORDER — CEFAZOLIN SODIUM-DEXTROSE 2-4 GM/100ML-% IV SOLN
2.0000 g | INTRAVENOUS | Status: AC
  Administered 2022-10-14: 2 g via INTRAVENOUS
  Filled 2022-10-14: qty 100

## 2022-10-14 MED ORDER — ONDANSETRON HCL 4 MG/2ML IJ SOLN
INTRAMUSCULAR | Status: DC | PRN
  Administered 2022-10-14: 4 mg via INTRAVENOUS

## 2022-10-14 MED ORDER — METHOCARBAMOL 500 MG PO TABS
500.0000 mg | ORAL_TABLET | Freq: Four times a day (QID) | ORAL | Status: DC | PRN
  Administered 2022-10-14 – 2022-10-15 (×2): 500 mg via ORAL
  Filled 2022-10-14 (×2): qty 1

## 2022-10-14 MED ORDER — ALUM & MAG HYDROXIDE-SIMETH 200-200-20 MG/5ML PO SUSP: 30.0000 mL | ORAL | Status: DC | PRN

## 2022-10-14 MED ORDER — POVIDONE-IODINE 10 % EX SWAB: 2.0000 | Freq: Once | CUTANEOUS | Status: DC

## 2022-10-14 MED ORDER — DEXAMETHASONE SODIUM PHOSPHATE 10 MG/ML IJ SOLN
10.0000 mg | Freq: Once | INTRAMUSCULAR | Status: AC
  Administered 2022-10-15: 10 mg via INTRAVENOUS
  Filled 2022-10-14: qty 1

## 2022-10-14 MED ORDER — DIPHENHYDRAMINE HCL 12.5 MG/5ML PO ELIX
12.5000 mg | ORAL_SOLUTION | ORAL | Status: DC | PRN
  Administered 2022-10-15: 25 mg via ORAL
  Filled 2022-10-14: qty 10

## 2022-10-14 MED ORDER — LACTATED RINGERS IV BOLUS: 500.0000 mL | Freq: Once | INTRAVENOUS | Status: DC

## 2022-10-14 MED ORDER — DEXAMETHASONE SODIUM PHOSPHATE 10 MG/ML IJ SOLN
INTRAMUSCULAR | Status: AC
  Filled 2022-10-14: qty 3

## 2022-10-14 MED ORDER — BUPIVACAINE IN DEXTROSE 0.75-8.25 % IT SOLN
INTRATHECAL | Status: DC | PRN
  Administered 2022-10-14: 1.6 mL via INTRATHECAL

## 2022-10-14 MED ORDER — TRANEXAMIC ACID-NACL 1000-0.7 MG/100ML-% IV SOLN
1000.0000 mg | INTRAVENOUS | Status: AC
  Administered 2022-10-14: 1000 mg via INTRAVENOUS
  Filled 2022-10-14: qty 100

## 2022-10-14 MED ORDER — MENTHOL 3 MG MT LOZG: 1.0000 | LOZENGE | OROMUCOSAL | Status: DC | PRN

## 2022-10-14 MED ORDER — POLYETHYLENE GLYCOL 3350 17 G PO PACK: 17.0000 g | PACK | Freq: Every day | ORAL | Status: DC | PRN

## 2022-10-14 MED ORDER — AMLODIPINE BESYLATE 5 MG PO TABS
2.5000 mg | ORAL_TABLET | Freq: Every day | ORAL | Status: DC
  Administered 2022-10-15: 2.5 mg via ORAL
  Filled 2022-10-14: qty 1

## 2022-10-14 MED ORDER — AMISULPRIDE (ANTIEMETIC) 5 MG/2ML IV SOLN: 10.0000 mg | Freq: Once | INTRAVENOUS | Status: DC | PRN

## 2022-10-14 MED ORDER — ACETAMINOPHEN 500 MG PO TABS
1000.0000 mg | ORAL_TABLET | Freq: Once | ORAL | Status: AC
  Administered 2022-10-14: 1000 mg via ORAL
  Filled 2022-10-14: qty 2

## 2022-10-14 MED ORDER — BUPIVACAINE LIPOSOME 1.3 % IJ SUSP
INTRAMUSCULAR | Status: AC
  Filled 2022-10-14: qty 10

## 2022-10-14 MED ORDER — 0.9 % SODIUM CHLORIDE (POUR BTL) OPTIME
TOPICAL | Status: DC | PRN
  Administered 2022-10-14: 1000 mL

## 2022-10-14 MED ORDER — LACTATED RINGERS IV SOLN: INTRAVENOUS | Status: DC

## 2022-10-14 MED ORDER — HYDROMORPHONE HCL 1 MG/ML IJ SOLN: 0.5000 mg | INTRAMUSCULAR | Status: DC | PRN

## 2022-10-14 MED ORDER — OXYCODONE HCL 5 MG PO TABS
5.0000 mg | ORAL_TABLET | ORAL | Status: DC | PRN
  Administered 2022-10-15: 10 mg via ORAL
  Filled 2022-10-14 (×4): qty 2

## 2022-10-14 MED ORDER — KETAMINE HCL 50 MG/5ML IJ SOSY
PREFILLED_SYRINGE | INTRAMUSCULAR | Status: AC
  Filled 2022-10-14: qty 5

## 2022-10-14 MED ORDER — METOCLOPRAMIDE HCL 5 MG PO TABS: 5.0000 mg | ORAL_TABLET | Freq: Three times a day (TID) | ORAL | Status: DC | PRN

## 2022-10-14 MED ORDER — BUPIVACAINE LIPOSOME 1.3 % IJ SUSP: 10.0000 mL | Freq: Once | INTRAMUSCULAR | Status: DC

## 2022-10-14 SURGICAL SUPPLY — 46 items
APL PRP STRL LF DISP 70% ISPRP (MISCELLANEOUS) ×1
BAG COUNTER SPONGE SURGICOUNT (BAG) IMPLANT
BAG SPEC THK2 15X12 ZIP CLS (MISCELLANEOUS)
BAG SPNG CNTER NS LX DISP (BAG) ×1
BAG ZIPLOCK 12X15 (MISCELLANEOUS) IMPLANT
BLADE SAG 18X100X1.27 (BLADE) ×2 IMPLANT
BLADE SURG SZ10 CARB STEEL (BLADE) ×2 IMPLANT
CHLORAPREP W/TINT 26 (MISCELLANEOUS) ×2 IMPLANT
CLSR STERI-STRIP ANTIMIC 1/2X4 (GAUZE/BANDAGES/DRESSINGS) ×2 IMPLANT
COVER PERINEAL POST (MISCELLANEOUS) ×2 IMPLANT
COVER SURGICAL LIGHT HANDLE (MISCELLANEOUS) ×2 IMPLANT
DRAPE IMP U-DRAPE 54X76 (DRAPES) ×2 IMPLANT
DRAPE STERI IOBAN 125X83 (DRAPES) ×2 IMPLANT
DRAPE U-SHAPE 47X51 STRL (DRAPES) ×4 IMPLANT
DRSG MEPILEX POST OP 4X8 (GAUZE/BANDAGES/DRESSINGS) ×2 IMPLANT
ELECT REM PT RETURN 15FT ADLT (MISCELLANEOUS) ×2 IMPLANT
GLOVE BIO SURGEON STRL SZ7.5 (GLOVE) ×2 IMPLANT
GLOVE BIOGEL PI IND STRL 7.5 (GLOVE) ×2 IMPLANT
GLOVE BIOGEL PI IND STRL 8 (GLOVE) ×2 IMPLANT
GLOVE SURG SYN 7.5 E (GLOVE) ×1 IMPLANT
GLOVE SURG SYN 7.5 PF PI (GLOVE) ×2 IMPLANT
GOWN STRL REUS W/ TWL LRG LVL3 (GOWN DISPOSABLE) ×2 IMPLANT
GOWN STRL REUS W/ TWL XL LVL3 (GOWN DISPOSABLE) ×2 IMPLANT
GOWN STRL REUS W/TWL LRG LVL3 (GOWN DISPOSABLE) ×1
GOWN STRL REUS W/TWL XL LVL3 (GOWN DISPOSABLE) ×1
HEAD BIOLOX HIP 36/-5 (Joint) IMPLANT
HIP BIOLOX HD 36/-5 (Joint) ×1 IMPLANT
HOLDER FOLEY CATH W/STRAP (MISCELLANEOUS) IMPLANT
INSERT 0 DEGREE 36 (Miscellaneous) IMPLANT
KIT TURNOVER KIT A (KITS) IMPLANT
MANIFOLD NEPTUNE II (INSTRUMENTS) ×2 IMPLANT
NS IRRIG 1000ML POUR BTL (IV SOLUTION) ×2 IMPLANT
PACK ANTERIOR HIP CUSTOM (KITS) ×2 IMPLANT
PROTECTOR NERVE ULNAR (MISCELLANEOUS) ×2 IMPLANT
SCREW HEX LP 6.5X20 (Screw) IMPLANT
SHELL TRIDENT II CLUST 50 (Shell) IMPLANT
SPIKE FLUID TRANSFER (MISCELLANEOUS) ×4 IMPLANT
STEM ACCOLADE SZ 6 (Hips) IMPLANT
SUT MNCRL AB 3-0 PS2 18 (SUTURE) ×2 IMPLANT
SUT VIC AB 0 CT1 36 (SUTURE) ×2 IMPLANT
SUT VIC AB 1 CT1 36 (SUTURE) ×2 IMPLANT
SUT VIC AB 2-0 CT1 27 (SUTURE) ×2
SUT VIC AB 2-0 CT1 TAPERPNT 27 (SUTURE) ×4 IMPLANT
TRAY FOLEY MTR SLVR 16FR STAT (SET/KITS/TRAYS/PACK) IMPLANT
TUBE SUCTION HIGH CAP CLEAR NV (SUCTIONS) ×2 IMPLANT
WATER STERILE IRR 1000ML POUR (IV SOLUTION) ×4 IMPLANT

## 2022-10-14 NOTE — Op Note (Signed)
10/14/2022  3:32 PM  PATIENT:  Gloria [name]   MRN: [id number]  PRE-OPERATIVE DIAGNOSIS:  OA LEFT HIP  POST-OPERATIVE DIAGNOSIS:  OA LEFT HIP  PROCEDURE:  Procedure(s): TOTAL HIP ARTHROPLASTY ANTERIOR APPROACH  PREOPERATIVE INDICATIONS:    Gloria [name] is an 77 y.o. female who has a diagnosis of S/P total left hip arthroplasty and elected for surgical management after failing conservative treatment.  The risks benefits and alternatives were discussed with the patient including but not limited to the risks of nonoperative treatment, versus surgical intervention including infection, bleeding, nerve injury, periprosthetic fracture, the need for revision surgery, dislocation, leg length discrepancy, blood clots, cardiopulmonary complications, morbidity, mortality, among others, and they were willing to proceed.     OPERATIVE REPORT     SURGEON:   Sheral Apley, MD    ASSISTANT:  Levester Fresh, PA-C, she was present and scrubbed throughout the case, critical for completion in a timely fashion, and for retraction, instrumentation, and closure.     ANESTHESIA:  General    COMPLICATIONS:  None.     COMPONENTS:  Stryker acolade fit femur size 6 with a 36 mm -5 head ball and an acetabular shell size 50 with a  polyethylene liner    PROCEDURE IN DETAIL:   The patient was met in the holding area and  identified.  The appropriate hip was identified and marked at the operative site.  The patient was then transported to the OR  and  placed under anesthesia per that record.  At that point, the patient was  placed in the supine position and  secured to the operating room table and all bony prominences padded. He received pre-operative antibiotics    The operative lower extremity was prepped from the iliac crest to the distal leg.  Sterile draping was performed.  Time out was performed prior to incision.      Skin incision was made just 2 cm lateral to the ASIS  extending in line with the  tensor fascia lata. Electrocautery was used to control all bleeders. I dissected down sharply to the fascia of the tensor fascia lata was confirmed that the muscle fibers beneath were running posteriorly. I then incised the fascia over the superficial tensor fascia lata in line with the incision. The fascia was elevated off the anterior aspect of the muscle the muscle was retracted posteriorly and protected throughout the case. I then used electrocautery to incise the tensor fascia lata fascia control and all bleeders. Immediately visible was the fat over top of the anterior neck and capsule.  I removed the anterior fat from the capsule and elevated the rectus muscle off of the anterior capsule. I then removed a large time of capsule. The retractors were then placed over the anterior acetabulum as well as around the superior and inferior neck.  I then made a femoral neck cut. Then used the power corkscrew to remove the femoral head from the acetabulum and thoroughly irrigated the acetabulum. I sized the femoral head.    I then exposed the deep acetabulum, cleared out any tissue including the ligamentum teres.   After adequate visualization, I excised the labrum, and then sequentially reamed.  I then impacted the acetabular implant into place using fluoroscopy for guidance.  Appropriate version and inclination was confirmed clinically matching their bony anatomy, and with fluoroscopy.  I placed a 20 mm screw in the posterior/superio position with an excellent bite.    I then placed the polyethylene liner  in place  I then adducted the leg and released the external rotators from the posterior femur allowing it to be easily delivered up lateral and anterior to the acetabulum for preparation of the femoral canal.    I then prepared the proximal femur using the cookie-cutter and then sequentially reamed and broached.  A trial broach, neck, and head was utilized, and I reduced the hip and used floroscopy to  assess the neck length and femoral implant.  I then impacted the femoral prosthesis into place into the appropriate version. The hip was then reduced and fluoroscopy confirmed appropriate position. Leg lengths were restored.  I then irrigated the hip copiously again with, and repaired the fascia with Vicryl, followed by monocryl for the subcutaneous tissue, Monocryl for the skin, Steri-Strips and sterile gauze. The patient was then awakened and returned to PACU in stable and satisfactory condition. There were no complications.  POST OPERATIVE PLAN: WBAT, DVT px: SCD's/TED, ambulation and chemical dvt px  Margarita Rana, MD Orthopedic Surgeon [phone]

## 2022-10-14 NOTE — Discharge Instructions (Signed)

## 2022-10-14 NOTE — Transfer of Care (Signed)
Immediate Anesthesia Transfer of Care Note  Patient: Gloria [name]  Procedure(s) Performed: TOTAL HIP ARTHROPLASTY ANTERIOR APPROACH (Left: Hip)  Patient Location: PACU  Anesthesia Type:Spinal  Level of Consciousness: awake, alert , oriented, and patient cooperative  Airway & Oxygen Therapy: Patient Spontanous Breathing and Patient connected to face mask oxygen  Post-op Assessment: Report given to RN and Post -op Vital signs reviewed and stable  Post vital signs: Reviewed and stable  Last Vitals:  Vitals Value Taken Time  BP 107/55 10/14/22 1540  Temp    Pulse 53 10/14/22 1543  Resp 14 10/14/22 1543  SpO2 98 % 10/14/22 1543  Vitals shown include unvalidated device data.  Last Pain:  Vitals:   10/14/22 1200  TempSrc:   PainSc: 1          Complications: No notable events documented.

## 2022-10-14 NOTE — Anesthesia Postprocedure Evaluation (Signed)
Anesthesia Post Note  Patient: Gloria [name]  Procedure(s) Performed: TOTAL HIP ARTHROPLASTY ANTERIOR APPROACH (Left: Hip)     Patient location during evaluation: PACU Anesthesia Type: MAC and Spinal Level of consciousness: awake and alert Pain management: pain level controlled Vital Signs Assessment: post-procedure vital signs reviewed and stable Respiratory status: spontaneous breathing, nonlabored ventilation, respiratory function stable and patient connected to nasal cannula oxygen Cardiovascular status: stable and blood pressure returned to baseline Postop Assessment: no apparent nausea or vomiting Anesthetic complications: no   No notable events documented.  Last Vitals:  Vitals:   10/14/22 1745 10/14/22 1800  BP: 127/66   Pulse: 68 73  Resp: 15 13  Temp:    SpO2: 100%     Last Pain:  Vitals:   10/14/22 1745  TempSrc:   PainSc: 0-No pain                 Earl Lites P Suad Autrey

## 2022-10-14 NOTE — Anesthesia Procedure Notes (Signed)
Spinal  Patient location during procedure: OR Start time: 10/14/2022 1:56 PM End time: 10/14/2022 1:57 PM Staffing Performed: anesthesiologist  Anesthesiologist: Atilano Median, DO Performed by: Atilano Median, DO Authorized by: Atilano Median, DO   Preanesthetic Checklist Completed: patient identified, IV checked, site marked, risks and benefits discussed, surgical consent, monitors and equipment checked, pre-op evaluation and timeout performed Spinal Block Patient position: sitting Prep: DuraPrep Patient monitoring: heart rate, cardiac monitor, continuous pulse ox and blood pressure Approach: midline Location: L3-4 Injection technique: single-shot Needle Needle type: Pencan  Needle gauge: 24 G Needle length: 10 cm Assessment Events: CSF return Additional Notes Patient identified. Risks/Benefits/Options discussed with patient including but not limited to bleeding, infection, nerve damage, paralysis, failed block, incomplete pain control, headache, blood pressure changes, nausea, vomiting, reactions to medications, itching and postpartum back pain. Confirmed with bedside nurse the patient's most recent platelet count. Confirmed with patient that they are not currently taking any anticoagulation, have any bleeding history or any family history of bleeding disorders. Patient expressed understanding and wished to proceed. All questions were answered. Sterile technique was used throughout the entire procedure. Please see nursing notes for vital signs. Warning signs of high block given to the patient including shortness of breath, tingling/numbness in hands, complete motor block, or any concerning symptoms with instructions to call for help. Patient was given instructions on fall risk and not to get out of bed. All questions and concerns addressed with instructions to call with any issues or inadequate analgesia.

## 2022-10-14 NOTE — Interval H&P Note (Signed)
History and Physical Interval Note:  10/14/2022 1:28 PM  Gloria [name]  has presented today for surgery, with the diagnosis of OA LEFT HIP.  The various methods of treatment have been discussed with the patient and family. After consideration of risks, benefits and other options for treatment, the patient has consented to  Procedure(s): TOTAL HIP ARTHROPLASTY ANTERIOR APPROACH (Left) as a surgical intervention.  The patient's history has been reviewed, patient examined, no change in status, stable for surgery.  I have reviewed the patient's chart and labs.  Questions were answered to the patient's satisfaction.     Sheral Apley

## 2022-10-14 NOTE — Anesthesia Preprocedure Evaluation (Addendum)
Anesthesia Evaluation  Patient identified by MRN, date of birth, ID band Patient awake    Reviewed: Allergy & Precautions, NPO status , Patient's Chart, lab work & pertinent test results  History of Anesthesia Complications (+) PONV and history of anesthetic complications (tremors)  Airway Mallampati: II  TM Distance: >3 FB Neck ROM: Full    Dental  (+) Dental Advisory Given   Pulmonary neg shortness of breath, neg sleep apnea, neg COPD, neg recent URI   Pulmonary exam normal breath sounds clear to auscultation       Cardiovascular hypertension (amlodipine, valsartan), Pt. on medications (-) angina (-) Past MI, (-) Cardiac Stents and (-) CABG (-) dysrhythmias  Rhythm:Regular Rate:Normal  HLD   Neuro/Psych  Headaches, neg Seizures PSYCHIATRIC DISORDERS  Depression     Neuromuscular disease (cervical spondylosis, lumbar spinal stenosis)    GI/Hepatic negative GI ROS, Neg liver ROS,,,  Endo/Other  negative endocrine ROS    Renal/GU CRFRenal disease     Musculoskeletal  (+) Arthritis , Osteoarthritis and Rheumatoid disorders,    Abdominal   Peds  Hematology negative hematology ROS (+)   Anesthesia Other Findings Platelets 213 on 10/06/2022  Reproductive/Obstetrics                              Anesthesia Physical Anesthesia Plan  ASA: 2  Anesthesia Plan: MAC and Spinal   Post-op Pain Management: Tylenol PO (pre-op)*   Induction: Intravenous  PONV Risk Score and Plan: 3 and Ondansetron, Dexamethasone and Treatment may vary due to age or medical condition  Airway Management Planned: Natural Airway and Simple Face Mask  Additional Equipment:   Intra-op Plan:   Post-operative Plan:   Informed Consent: I have reviewed the patients History and Physical, chart, labs and discussed the procedure including the risks, benefits and alternatives for the proposed anesthesia with the patient or  authorized representative who has indicated his/her understanding and acceptance.     Dental advisory given  Plan Discussed with:   Anesthesia Plan Comments: (See PAT note 10/06/2022  Patient has received sevoflurane, fentanyl, and Zofran without issues in subsequent anesthetics to her 2016 anesthetic where she had the uncontrolled shaking resulting in an overnight stay. She has not had propofol or midazolam since that time. All of her inductions have been mask inductions with sevoflurane. Discussed with patient spinal anesthesia with MAC with Precedex infusion instead of propofol infusion. Discussed GA with mask induction as backup. Patient in agreement with this plan.  I have discussed risks of neuraxial anesthesia including but not limited to infection, bleeding, nerve injury, back pain, headache, seizures, and failure of block. Patient denies bleeding disorders and is not currently anticoagulated. Labs have been reviewed. Risks and benefits discussed. All patient's questions answered.   Discussed with patient risks of MAC including, but not limited to, minor pain or discomfort, hearing people in the room, and possible need for backup general anesthesia. Risks for general anesthesia also discussed including, but not limited to, sore throat, hoarse voice, chipped/damaged teeth, injury to vocal cords, nausea and vomiting, allergic reactions, lung infection, heart attack, stroke, and death. All questions answered. )        Anesthesia Quick Evaluation

## 2022-10-15 ENCOUNTER — Other Ambulatory Visit: Payer: Self-pay

## 2022-10-15 ENCOUNTER — Encounter (HOSPITAL_COMMUNITY): Payer: Self-pay | Admitting: Orthopedic Surgery

## 2022-10-15 DIAGNOSIS — M1612 Unilateral primary osteoarthritis, left hip: Secondary | ICD-10-CM | POA: Diagnosis not present

## 2022-10-15 MED ORDER — SENNA-DOCUSATE SODIUM 8.6-50 MG PO TABS: 2.0000 | ORAL_TABLET | Freq: Every day | ORAL | 1 refills | Status: AC | PRN

## 2022-10-15 MED ORDER — ACETAMINOPHEN 500 MG PO TABS: 1000.0000 mg | ORAL_TABLET | Freq: Four times a day (QID) | ORAL | 0 refills | Status: AC | PRN

## 2022-10-15 MED ORDER — METHOCARBAMOL 750 MG PO TABS: 750.0000 mg | ORAL_TABLET | Freq: Three times a day (TID) | ORAL | 0 refills | Status: AC | PRN

## 2022-10-15 MED ORDER — ASPIRIN 81 MG PO TBEC: 81.0000 mg | DELAYED_RELEASE_TABLET | Freq: Two times a day (BID) | ORAL | 0 refills | Status: AC

## 2022-10-15 MED ORDER — ACETAMINOPHEN 500 MG PO TABS
ORAL_TABLET | ORAL | Status: AC
  Filled 2022-10-15: qty 1

## 2022-10-15 MED ORDER — OXYCODONE HCL 5 MG PO TABS: 5.0000 mg | ORAL_TABLET | ORAL | 0 refills | Status: AC | PRN

## 2022-10-15 MED ORDER — METOCLOPRAMIDE HCL 10 MG PO TABS: 10.0000 mg | ORAL_TABLET | Freq: Three times a day (TID) | ORAL | 0 refills | Status: AC | PRN

## 2022-10-15 NOTE — Progress Notes (Signed)
    Subjective: Patient reports pain as mild to moderate.  Tolerating diet.  Urinating.   No CP, SOB.  Has not mobilized OOB with PT yet. Nurse helped her move from bed to chair.   Has a lot of concerns about anesthesia during surgery yesterday.  Objective:   VITALS:   Vitals:   10/15/22 0143 10/15/22 0531 10/15/22 0733 10/15/22 0924  BP: 133/64 (!) 156/78  134/60  Pulse: 76 84  77  Resp: 18 18  17   Temp: 98.1 F (36.7 C) 98.2 F (36.8 C)  97.6 F (36.4 C)  TempSrc: Oral Oral    SpO2: 96% 96%  96%  Weight:   59.4 kg   Height:   5' 6.5" (1.689 m)       Latest Ref Rng & Units 10/06/2022   11:29 AM 02/26/2020    2:11 PM 01/26/2018    6:53 PM  CBC  WBC 4.0 - 10.5 K/uL 7.3  2.4  4.8   Hemoglobin 12.0 - 15.0 g/dL 16.1  09.6  04.5   Hematocrit 36.0 - 46.0 % 42.1  43.9  38.9   Platelets 150 - 400 K/uL 213  119  274       Latest Ref Rng & Units 10/06/2022   11:29 AM 02/26/2020    8:46 PM 02/26/2020    2:11 PM  BMP  Glucose 70 - 99 mg/dL 97  409  811   BUN 8 - 23 mg/dL 26  17  23    Creatinine 0.44 - 1.00 mg/dL 9.14  7.82  9.56   Sodium 135 - 145 mmol/L 140  136  136   Potassium 3.5 - 5.1 mmol/L 4.0  3.1  2.3   Chloride 98 - 111 mmol/L 106  95  88   CO2 22 - 32 mmol/L 26  29  33   Calcium 8.9 - 10.3 mg/dL 9.2  8.0  8.8    Intake/Output      06/11 0701 06/12 0700 06/12 0701 06/13 0700   P.O. 1210 240   I.V. (mL/kg) 1000    IV Piggyback 300    Total Intake(mL/kg) 2510 240 (4)   Urine (mL/kg/hr) 1500 825 (4.9)   Blood 200    Total Output 1700 825   Net +810 -585           Physical Exam: General: NAD.  Sitting up in bedside chair, calm, comfortable Resp: No increased wob Cardio: regular rate and rhythm ABD soft Neurologically intact MSK Neurovascularly intact Sensation intact distally Intact pulses distally Dorsiflexion/Plantar flexion intact Incision: dressing C/D/I   Assessment: 1 Day Post-Op  S/P Procedure(s) (LRB): TOTAL HIP ARTHROPLASTY ANTERIOR  APPROACH (Left) by Dr. Jewel Baize. Murphy on 10/14/22  Principal Problem:   S/P total left hip arthroplasty   Plan: Discuss anesthesia concerns with Dr. Eulah Pont & patient  Advance diet Up with therapy Incentive Spirometry Elevate and Apply ice  Weightbearing: WBAT LLE Insicional and dressing care: Dressings left intact until follow-up and Reinforce dressings as needed Orthopedic device(s): None Showering: Keep dressing dry VTE prophylaxis: Aspirin 81mg  BID  x 30 days postop , SCDs, ambulation Pain control: PRN Follow - up plan: 2 weeks Contact information:  Margarita Rana MD, Levester Fresh PA-C  Dispo: Home once passes PT.     Jenne Pane, PA-C Office [phone] 10/15/2022, 9:50 AM

## 2022-10-15 NOTE — TOC Transition Note (Signed)
Transition of Care [facility]) - CM/SW Discharge Note   Patient Details  Name: Gloria [name] MRN: [id number] Date of Birth: 11/04/1945  [facility] CM/SW Contact:  Amada Jupiter, LCSW Phone Number: 10/15/2022, 10:31 AM   Clinical Narrative:     Met with pt and confirming she has needed DME in the home.  OPPT already arranged with [facility].  No TOC needs.  Final next level of care: OP Rehab Barriers to Discharge: No Barriers Identified   Patient Goals and CMS Choice      Discharge Placement                         Discharge Plan and Services Additional resources added to the After Visit Summary for                  DME Arranged: N/A DME Agency: NA                  Social Determinants of Health (SDOH) Interventions SDOH Screenings   Food Insecurity: No Food Insecurity (10/15/2022)  Housing: Low Risk  (10/15/2022)  Transportation Needs: No Transportation Needs (10/15/2022)  Utilities: Not At Risk (10/15/2022)  Tobacco Use: Low Risk  (10/15/2022)     Readmission Risk Interventions     No data to display

## 2022-10-15 NOTE — Evaluation (Signed)
Physical Therapy Evaluation Patient Details Name: Gloria [name] MRN: [id number] DOB: 12-21-1945 Today's Date: 10/15/2022  History of Present Illness  77 yo female s/p L THA-DA 10/14/22.  Clinical Impression  On eval, pt required Min A for mobility. She walked ~50 feet with a RW. After ~25 feet, pt began to c/o nausea and also became a bit drowsy, less talkative. Able to make it back to the room without incident--BP 113/63-made RN aware. Moderate pain with activity.  Will plan to have a 2nd session prior to potential d/c later today if pt meets her PT goals.      Recommendations for follow up therapy are one component of a multi-disciplinary discharge planning process, led by the attending physician.  Recommendations may be updated based on patient status, additional functional criteria and insurance authorization.  Follow Up Recommendations       Assistance Recommended at Discharge Intermittent Supervision/Assistance  Patient can return home with the following  A little help with walking and/or transfers;A little help with bathing/dressing/bathroom;Assistance with cooking/housework;Assist for transportation;Help with stairs or ramp for entrance    Equipment Recommendations None recommended by PT  Recommendations for Other Services       Functional Status Assessment Patient has had a recent decline in their functional status and demonstrates the ability to make significant improvements in function in a reasonable and predictable amount of time.     Precautions / Restrictions Precautions Precautions: Fall Restrictions Weight Bearing Restrictions: No Other Position/Activity Restrictions: WBAT      Mobility  Bed Mobility               General bed mobility comments: oob in recliner    Transfers Overall transfer level: Needs assistance Equipment used: Rolling walker (2 wheels) Transfers: Sit to/from Stand Sit to Stand: Min assist           General transfer comment:  Cues for safety, technique, hand placement. Assist to rise, steady, control descent. Increased time.    Ambulation/Gait Ambulation/Gait assistance: Min assist Gait Distance (Feet): 50 Feet Assistive device: Rolling walker (2 wheels) Gait Pattern/deviations: Step-to pattern       General Gait Details: Assist to steady throughout distance. After ~25 feet, pt began to c/o nausea and also became a bit drowsy, less talkative. Able to make it back to the room without incident, BP 113/63-made RN aware.  Stairs            Wheelchair Mobility    Modified Rankin (Stroke Patients Only)       Balance Overall balance assessment: Needs assistance         Standing balance support: Reliant on assistive device for balance, Bilateral upper extremity supported, During functional activity Standing balance-Leahy Scale: Fair                               Pertinent Vitals/Pain Pain Assessment Pain Assessment: Faces Faces Pain Scale: Hurts even more Pain Location: L hip thigh Pain Descriptors / Indicators: Grimacing, Operative site guarding Pain Intervention(s): Limited activity within patient's tolerance, Monitored during session, Ice applied, Repositioned    Home Living Family/patient expects to be discharged to:: Private residence Living Arrangements: Alone Available Help at Discharge: Friend(s) Type of Home: House Home Access: Stairs to enter   [employer] of Steps: 1   Home Layout: One level Home Equipment: Agricultural consultant (2 wheels)      Prior Function Prior Level of Function : Independent/Modified Independent  Hand Dominance        Extremity/Trunk Assessment   Upper Extremity Assessment Upper Extremity Assessment: Overall WFL for tasks assessed    Lower Extremity Assessment Lower Extremity Assessment: Generalized weakness    Cervical / Trunk Assessment Cervical / Trunk Assessment: Normal  Communication    Communication: No difficulties  Cognition Arousal/Alertness: Awake/alert Behavior During Therapy: WFL for tasks assessed/performed Overall Cognitive Status: Within Functional Limits for tasks assessed                                          General Comments      Exercises Total Joint Exercises Ankle Circles/Pumps: AROM, Both, 10 reps Quad Sets: AROM, Both, 10 reps Heel Slides: AAROM, Left, 10 reps (pt used gait belt) Hip ABduction/ADduction: AAROM, Left, 10 reps (pt used gait belt)   Assessment/Plan    PT Assessment Patient needs continued PT services  PT Problem List Decreased strength;Decreased range of motion;Decreased activity tolerance;Decreased balance;Decreased mobility;Decreased knowledge of use of DME;Pain       PT Treatment Interventions DME instruction;Gait training;Therapeutic exercise;Balance training;Stair training;Functional mobility training;Therapeutic activities;Patient/family education    PT Goals (Current goals can be found in the Care Plan section)  Acute Rehab PT Goals Patient Stated Goal: home. regain independence PT Goal Formulation: With patient Time For Goal Achievement: 10/29/22 Potential to Achieve Goals: Good    Frequency 7X/week     Co-evaluation               AM-PAC PT "6 Clicks" Mobility  Outcome Measure Help needed turning from your back to your side while in a flat bed without using bedrails?: A Little Help needed moving from lying on your back to sitting on the side of a flat bed without using bedrails?: A Little Help needed moving to and from a bed to a chair (including a wheelchair)?: A Little Help needed standing up from a chair using your arms (e.g., wheelchair or bedside chair)?: A Little Help needed to walk in hospital room?: A Little Help needed climbing 3-5 steps with a railing? : A Little 6 Click Score: 18    End of Session Equipment Utilized During Treatment: Gait belt Activity Tolerance: Patient  limited by fatigue;Patient limited by pain Patient left: in chair;with call bell/phone within reach;with chair alarm set   PT Visit Diagnosis: Other abnormalities of gait and mobility (R26.89);Pain Pain - Right/Left: Left Pain - part of body: Hip    Time: 1610-9604 PT Time Calculation (min) (ACUTE ONLY): 24 min   Charges:   PT Evaluation $PT Eval Low Complexity: 1 Low PT Treatments $Gait Training: 8-22 mins         Faye Ramsay, PT Acute Rehabilitation  Office: [phone]

## 2022-10-15 NOTE — Plan of Care (Signed)
Plan of care initiated and discussed. 

## 2022-10-15 NOTE — Discharge Summary (Signed)
Physician Discharge Summary  Patient ID: Gloria [name] MRN: [id number] DOB/AGE: 11/19/1945 77 y.o.  Admit date: 10/14/2022 Discharge date: 10/15/2022  Admission Diagnoses: left hip OA  Discharge Diagnoses:  Principal Problem:   S/P total left hip arthroplasty   Discharged Condition: fair  Hospital Course: Patient underwent a left THA with Dr. Eulah Pont on 10/14/22 without complications. She spent the night in observation for pain control and mobilization. She has passed her PT evaluation and is ready for discharge home.   Consults: None  Significant Diagnostic Studies: n/a  Treatments: IV hydration, antibiotics: Ancef, analgesia: acetaminophen, Dilaudid, and Oxycodone, anticoagulation: ASA, therapies: PT and SW, and surgery: left THA  Discharge Exam: Blood pressure (!) 142/64, pulse 75, temperature 97.9 F (36.6 C), resp. rate 17, height 5' 6.5" (1.689 m), weight 59.4 kg, SpO2 97 %. General appearance: alert, cooperative, and no distress Head: Normocephalic, without obvious abnormality, atraumatic Resp: clear to auscultation bilaterally Cardio: regular rate and rhythm Extremities: extremities normal, atraumatic, no cyanosis or edema Pulses:  L brachial 2+ R brachial 2+  L radial 2+ R radial 2+  L inguinal 2+ R inguinal 2+  L popliteal 2+ R popliteal 2+  L posterior tibial 2+ R posterior tibial 2+  L dorsalis pedis 2+ R dorsalis pedis 2+   Neurologic: Grossly normal Incision/Wound: c/d/i  Disposition: Discharge disposition: 01-Home or Self Care       Discharge Instructions     Call MD / Call 911   Complete by: As directed    If you experience chest pain or shortness of breath, CALL 911 and be transported to the hospital emergency room.  If you develope a fever above 101 F, pus (white drainage) or increased drainage or redness at the wound, or calf pain, call your surgeon's office.   Diet - low sodium heart healthy   Complete by: As directed    Discharge instructions    Complete by: As directed    You may bear weight as tolerated. Keep your dressing on and dry until follow up. Take medicine to prevent blood clots as directed. Take pain medicine as needed with the goal of transitioning to over the counter medicines.    INSTRUCTIONS AFTER JOINT REPLACEMENT   Remove items at home which could result in a fall. This includes throw rugs or furniture in walking pathways ICE to the affected joint every three hours while awake for 30 minutes at a time, for at least the first 3-5 days, and then as needed for pain and swelling.  Continue to use ice for pain and swelling. You may notice swelling that will progress down to the foot and ankle.  This is normal after surgery.  Elevate your leg when you are not up walking on it.   Continue to use the breathing machine you got in the hospital (incentive spirometer) which will help keep your temperature down.  It is common for your temperature to cycle up and down following surgery, especially at night when you are not up moving around and exerting yourself.  The breathing machine keeps your lungs expanded and your temperature down.   DIET:  As you were doing prior to hospitalization, we recommend a well-balanced diet.  DRESSING / WOUND CARE / SHOWERING  You may shower 3 days after surgery, but keep the wounds dry during showering.  You may use an occlusive plastic wrap (Press'n Seal for example) with blue painter's tape at edges, NO SOAKING/SUBMERGING IN THE BATHTUB.  If the bandage gets  wet, call the office.   ACTIVITY  Increase activity slowly as tolerated, but follow the weight bearing instructions below.   No driving for 6 weeks or until further direction given by your physician.  You cannot drive while taking narcotics.  No lifting or carrying greater than 10 lbs. until further directed by your surgeon. Avoid periods of inactivity such as sitting longer than an hour when not asleep. This helps prevent blood clots.   You may return to work once you are authorized by your doctor.    WEIGHT BEARING   Weight bearing as tolerated with assist device (walker, cane, etc) as directed, use it as long as suggested by your surgeon or therapist, typically at least 4-6 weeks.   EXERCISES  Results after joint replacement surgery are often greatly improved when you follow the exercise, range of motion and muscle strengthening exercises prescribed by your doctor. Safety measures are also important to protect the joint from further injury. Any time any of these exercises cause you to have increased pain or swelling, decrease what you are doing until you are comfortable again and then slowly increase them. If you have problems or questions, call your caregiver or physical therapist for advice.   Rehabilitation is important following a joint replacement. After just a few days of immobilization, the muscles of the leg can become weakened and shrink (atrophy).  These exercises are designed to build up the tone and strength of the thigh and leg muscles and to improve motion. Often times heat used for twenty to thirty minutes before working out will loosen up your tissues and help with improving the range of motion but do not use heat for the first two weeks following surgery (sometimes heat can increase post-operative swelling).   These exercises can be done on a training (exercise) mat, on the floor, on a table or on a bed. Use whatever works the best and is most comfortable for you.    Use music or television while you are exercising so that the exercises are a pleasant break in your day. This will make your life better with the exercises acting as a break in your routine that you can look forward to.   Perform all exercises about fifteen times, three times per day or as directed.  You should exercise both the operative leg and the other leg as well.  Exercises include:   Quad Sets - Tighten up the muscle on the front of the  thigh (Quad) and hold for 5-10 seconds.   Straight Leg Raises - With your knee straight (if you were given a brace, keep it on), lift the leg to 60 degrees, hold for 3 seconds, and slowly lower the leg.  Perform this exercise against resistance later as your leg gets stronger.  Leg Slides: Lying on your back, slowly slide your foot toward your buttocks, bending your knee up off the floor (only go as far as is comfortable). Then slowly slide your foot back down until your leg is flat on the floor again.  Angel Wings: Lying on your back spread your legs to the side as far apart as you can without causing discomfort.  Hamstring Strength:  Lying on your back, push your heel against the floor with your leg straight by tightening up the muscles of your buttocks.  Repeat, but this time bend your knee to a comfortable angle, and push your heel against the floor.  You may put a pillow under the heel to make  it more comfortable if necessary.   A rehabilitation program following joint replacement surgery can speed recovery and prevent re-injury in the future due to weakened muscles. Contact your doctor or a physical therapist for more information on knee rehabilitation.    CONSTIPATION  Constipation is defined medically as fewer than three stools per week and severe constipation as less than one stool per week.  Even if you have a regular bowel pattern at home, your normal regimen is likely to be disrupted due to multiple reasons following surgery.  Combination of anesthesia, postoperative narcotics, change in appetite and fluid intake all can affect your bowels.   YOU MUST use at least one of the following options; they are listed in order of increasing strength to get the job done.  They are all available over the counter, and you may need to use some, POSSIBLY even all of these options:    Drink plenty of fluids (prune juice may be helpful) and high fiber foods Colace 100 mg by mouth twice a day  Senokot  for constipation as directed and as needed Dulcolax (bisacodyl), take with full glass of water  Miralax (polyethylene glycol) once or twice a day as needed.  If you have tried all these things and are unable to have a bowel movement in the first 3-4 days after surgery call either your surgeon or your primary doctor.    If you experience loose stools or diarrhea, hold the medications until you stool forms back up.  If your symptoms do not get better within 1 week or if they get worse, check with your doctor.  If you experience "the worst abdominal pain ever" or develop nausea or vomiting, please contact the office immediately for further recommendations for treatment.   ITCHING:  If you experience itching with your medications, try taking only a single pain pill, or even half a pain pill at a time.  You can also use Benadryl over the counter for itching or also to help with sleep.   TED HOSE STOCKINGS:  Use stockings on both legs until for at least 2 weeks or as directed by physician office. They may be removed at night for sleeping.  MEDICATIONS:  See your medication summary on the "After Visit Summary" that nursing will review with you.  You may have some home medications which will be placed on hold until you complete the course of blood thinner medication.  It is important for you to complete the blood thinner medication as prescribed.  Take medicines as prescribed.   You have several different medicines that work in different ways. - Tylenol is for mild to moderate pain. Try to take this medicine before turning to your narcotic medicines.  - Robaxin is for muscle spasms. This medicine can make you drowsy. - Oxycodone is a narcotic pain medicine.  Take this for severe pain. This medicine can be dehydrating / constipating. - Reglan is for nausea and vomiting. - Senokot is for constipation prevention while taking narcotics.  - Aspirin is to prevent blood clots after surgery. YOU MUST TAKE THIS  MEDICINE!  PRECAUTIONS:  If you experience chest pain or shortness of breath - call 911 immediately for transfer to the hospital emergency department.   If you develop a fever greater that 101 F, purulent drainage from wound, increased redness or drainage from wound, foul odor from the wound/dressing, or calf pain - CONTACT YOUR SURGEON.  FOLLOW-UP APPOINTMENTS:  If you do not already have a post-op appointment, please call the office [phone] for an appointment to be seen by Dr. Eulah Pont in 2 weeks.   OTHER INSTRUCTIONS:   MAKE SURE YOU:  Understand these instructions.  Get help right away if you are not doing well or get worse.    Thank you for letting us be a part of your medical care team.  It is a privilege we respect greatly.  We hope these instructions will help you stay on track for a fast and full recovery!   Driving restrictions   Complete by: As directed    No driving for 2-4 weeks   Post-operative opioid taper instructions:   Complete by: As directed    POST-OPERATIVE OPIOID TAPER INSTRUCTIONS: It is important to wean off of your opioid medication as soon as possible. If you do not need pain medication after your surgery it is ok to stop day one. Opioids include: Codeine, Hydrocodone(Norco, Vicodin), Oxycodone(Percocet, oxycontin) and hydromorphone amongst others.  Long term and even short term use of opiods can cause: Increased pain response Dependence Constipation Depression Respiratory depression And more.  Withdrawal symptoms can include Flu like symptoms Nausea, vomiting And more Techniques to manage these symptoms Hydrate well Eat regular healthy meals Stay active Use relaxation techniques(deep breathing, meditating, yoga) Do Not substitute Alcohol to help with tapering If you have been on opioids for less than two weeks and do not have pain than it is ok to stop all together.  Plan to wean off of  opioids This plan should start within one week post op of your joint replacement. Maintain the same interval or time between taking each dose and first decrease the dose.  Cut the total daily intake of opioids by one tablet each day Next start to increase the time between doses. The last dose that should be eliminated is the evening dose.      TED hose   Complete by: As directed    Use stockings (TED hose) for 2 weeks on left leg(s).  You may remove them at night for sleeping.   Weight bearing as tolerated   Complete by: As directed       Allergies as of 10/15/2022       Reactions   Versed [midazolam] Other (See Comments)   Uncontrolled shaking   Diprivan [propofol] Other (See Comments)   Uncontrolled shaking   Fentanyl Other (See Comments)   Uncontrolled shaking   Gabapentin Other (See Comments)   Pt has age related macular degeneration    Sevoflurane Other (See Comments)   Uncontrolled shaking   Zofran [ondansetron Hcl] Other (See Comments)   Uncontrolled shaking        Medication List     STOP taking these medications    tiZANidine 4 MG tablet Commonly known as: ZANAFLEX       TAKE these medications    acetaminophen 500 MG tablet Commonly known as: TYLENOL Take 2 tablets (1,000 mg total) by mouth every 6 (six) hours as needed for mild pain or moderate pain.   alendronate 10 MG tablet Commonly known as: FOSAMAX Take 10 mg by mouth daily before breakfast. Take with a full glass of water on an empty stomach.   AMBULATORY NON FORMULARY MEDICATION Walker Disp 1   amLODipine 2.5 MG tablet Commonly known as: NORVASC Take 2.5 mg by mouth daily.   aspirin EC 81 MG tablet Take 1 tablet (81 mg total) by mouth 2 (  two) times daily. To prevent blood clots for 30 days after surgery. What changed:  when to take this additional instructions   cyanocobalamin 500 MCG tablet Commonly known as: VITAMIN B12 Take 500 mcg by mouth daily.   estradiol 0.1 MG/GM  vaginal cream Commonly known as: ESTRACE Place 1 Applicatorful vaginally 2 (two) times a week.   hydroxychloroquine 200 MG tablet Commonly known as: PLAQUENIL Take 400 mg by mouth daily.   Lumify 0.025 % Soln Generic drug: Brimonidine Tartrate Place 1 drop into both eyes daily as needed (redness).   methocarbamol 750 MG tablet Commonly known as: Robaxin-750 Take 1 tablet (750 mg total) by mouth every 8 (eight) hours as needed for muscle spasms.   metoCLOPramide 10 MG tablet Commonly known as: REGLAN Take 1 tablet (10 mg total) by mouth every 8 (eight) hours as needed for nausea or vomiting.   multivitamin with minerals Tabs tablet Take 1 tablet by mouth daily.   oxyCODONE 5 MG immediate release tablet Commonly known as: Roxicodone Take 1 tablet (5 mg total) by mouth every 4 (four) hours as needed for severe pain.   PreserVision AREDS 2 Caps Take 1 capsule by mouth 2 (two) times daily.   sennosides-docusate sodium 8.6-50 MG tablet Commonly known as: SENOKOT-S Take 2 tablets by mouth daily as needed for constipation (while taking narcotics).   TART CHERRY PO Take 10,500 mg by mouth daily.   traZODone 50 MG tablet Commonly known as: DESYREL Take 50-150 mg by mouth at bedtime as needed for sleep.   Turmeric Curcumin 500 MG Caps Take 500 mg by mouth daily.   valsartan 160 MG tablet Commonly known as: DIOVAN Take 160 mg by mouth daily.   Zinc 50 MG Tabs Take 50 mg by mouth daily.               Discharge Care Instructions  (From admission, onward)           Start     Ordered   10/15/22 0000  Weight bearing as tolerated        10/15/22 1513            Follow-up Information     Sheral Apley, MD. Go on 10/29/2022.   Specialty: Orthopedic Surgery Why: your appointment is scheduled for 4:15. Contact information: [address] [place] [address] [phone]         [employer], [place]. Go on  10/17/2022.   Why: your outpatient physical therapy is scheduled for  10:15. Please arrive at 10:00 to complete your paperwork Contact information: [facility] [address] [place] [address] [phone]                 Signed: Marzetta Board 10/15/2022, 3:14 PM

## 2022-10-15 NOTE — Progress Notes (Signed)
PT  Note  Patient Details Name: RONALD VINSANT MRN: [id number] DOB: 04-17-1946   Cancelled Treatment:    Treatment session note to follow. Pt has met PT goals. All PT education completed.   Faye Ramsay, PT Acute Rehabilitation  Office: [phone]

## 2022-10-15 NOTE — Progress Notes (Signed)
Physical Therapy Treatment Patient Details Name: Gloria [name] MRN: [id number] DOB: 05-12-1945 Today's Date: 10/15/2022   History of Present Illness 77 yo female s/p L THA-DA 10/14/22.    PT Comments    2nd session to continue gait and stair training. Pt reports feeling much better this afternoon. Encouraged her to ambulate often at home, as tolerated. All PT education completed.    Recommendations for follow up therapy are one component of a multi-disciplinary discharge planning process, led by the attending physician.  Recommendations may be updated based on patient status, additional functional criteria and insurance authorization.  Follow Up Recommendations       Assistance Recommended at Discharge Intermittent Supervision/Assistance  Patient can return home with the following A little help with walking and/or transfers;A little help with bathing/dressing/bathroom;Assistance with cooking/housework;Assist for transportation;Help with stairs or ramp for entrance   Equipment Recommendations  None recommended by PT    Recommendations for Other Services       Precautions / Restrictions Precautions Precautions: Fall Restrictions Weight Bearing Restrictions: No Other Position/Activity Restrictions: WBAT     Mobility  Bed Mobility               General bed mobility comments: oob in recliner    Transfers Overall transfer level: Needs assistance Equipment used: Rolling walker (2 wheels) Transfers: Sit to/from Stand Sit to Stand: Min guard           General transfer comment: Min guard for safety. Increased time.    Ambulation/Gait Ambulation/Gait assistance: Min guard Gait Distance (Feet): 115 Feet Assistive device: Rolling walker (2 wheels) Gait Pattern/deviations: Step-through pattern, Decreased stride length       General Gait Details: Min guard for safety. Pt denied nausea, dizziness. Tolerated distance well.   Stairs Stairs: Yes Stairs assistance:  Min guard Stair Management: Step to pattern, Forwards, With walker Number of Stairs: 1 General stair comments: Min guard for safety. Cues for safety, technique, sequence.   Wheelchair Mobility    Modified Rankin (Stroke Patients Only)       Balance Overall balance assessment: Needs assistance         Standing balance support: Reliant on assistive device for balance, Bilateral upper extremity supported, During functional activity Standing balance-Leahy Scale: Fair                              Cognition Arousal/Alertness: Awake/alert Behavior During Therapy: WFL for tasks assessed/performed Overall Cognitive Status: Within Functional Limits for tasks assessed                                          Exercises Total Joint Exercises Ankle Circles/Pumps: AROM, Both, 10 reps Quad Sets: AROM, Both, 10 reps Heel Slides: AAROM, Left, 10 reps (pt used gait belt) Hip ABduction/ADduction: AAROM, Left, 10 reps (pt used gait belt)    General Comments        Pertinent Vitals/Pain Pain Assessment Pain Assessment: Faces Faces Pain Scale: Hurts little more Pain Location: L hip thigh Pain Descriptors / Indicators: Grimacing, Operative site guarding Pain Intervention(s): Monitored during session, Repositioned    Home Living                          Prior Function  PT Goals (current goals can now be found in the care plan section) Progress towards PT goals: Progressing toward goals    Frequency    7X/week      PT Plan Current plan remains appropriate    Co-evaluation              AM-PAC PT "6 Clicks" Mobility   Outcome Measure  Help needed turning from your back to your side while in a flat bed without using bedrails?: A Little Help needed moving from lying on your back to sitting on the side of a flat bed without using bedrails?: A Little Help needed moving to and from a bed to a chair (including a  wheelchair)?: A Little Help needed standing up from a chair using your arms (e.g., wheelchair or bedside chair)?: A Little Help needed to walk in hospital room?: A Little Help needed climbing 3-5 steps with a railing? : A Little 6 Click Score: 18    End of Session Equipment Utilized During Treatment: Gait belt Activity Tolerance: Patient tolerated treatment well Patient left: in chair;with call bell/phone within reach   PT Visit Diagnosis: Other abnormalities of gait and mobility (R26.89) Pain - Right/Left: Right Pain - part of body: Hip     Time: 1610-9604 PT Time Calculation (min) (ACUTE ONLY): 18 min  Charges:  $Gait Training: 8-22 mins                         Faye Ramsay, PT Acute Rehabilitation  Office: [phone]

## 2022-10-17 DIAGNOSIS — M1612 Unilateral primary osteoarthritis, left hip: Secondary | ICD-10-CM | POA: Diagnosis not present

## 2022-10-20 DIAGNOSIS — M1612 Unilateral primary osteoarthritis, left hip: Secondary | ICD-10-CM | POA: Diagnosis not present

## 2022-10-24 DIAGNOSIS — M1612 Unilateral primary osteoarthritis, left hip: Secondary | ICD-10-CM | POA: Diagnosis not present

## 2022-10-27 DIAGNOSIS — M1612 Unilateral primary osteoarthritis, left hip: Secondary | ICD-10-CM | POA: Diagnosis not present

## 2022-10-29 DIAGNOSIS — M1612 Unilateral primary osteoarthritis, left hip: Secondary | ICD-10-CM | POA: Diagnosis not present

## 2022-11-03 DIAGNOSIS — M1612 Unilateral primary osteoarthritis, left hip: Secondary | ICD-10-CM | POA: Diagnosis not present

## 2022-11-17 DIAGNOSIS — H3530 Unspecified macular degeneration: Secondary | ICD-10-CM | POA: Diagnosis not present

## 2022-11-17 DIAGNOSIS — M069 Rheumatoid arthritis, unspecified: Secondary | ICD-10-CM | POA: Diagnosis not present

## 2022-11-17 DIAGNOSIS — G4700 Insomnia, unspecified: Secondary | ICD-10-CM | POA: Diagnosis not present

## 2022-11-17 DIAGNOSIS — D84821 Immunodeficiency due to drugs: Secondary | ICD-10-CM | POA: Diagnosis not present

## 2022-11-17 DIAGNOSIS — M1990 Unspecified osteoarthritis, unspecified site: Secondary | ICD-10-CM | POA: Diagnosis not present

## 2022-11-17 DIAGNOSIS — M810 Age-related osteoporosis without current pathological fracture: Secondary | ICD-10-CM | POA: Diagnosis not present

## 2022-11-17 DIAGNOSIS — E785 Hyperlipidemia, unspecified: Secondary | ICD-10-CM | POA: Diagnosis not present

## 2022-11-17 DIAGNOSIS — I10 Essential (primary) hypertension: Secondary | ICD-10-CM | POA: Diagnosis not present

## 2022-11-17 DIAGNOSIS — Z7982 Long term (current) use of aspirin: Secondary | ICD-10-CM | POA: Diagnosis not present

## 2022-11-17 DIAGNOSIS — N183 Chronic kidney disease, stage 3 unspecified: Secondary | ICD-10-CM | POA: Diagnosis not present

## 2022-11-17 DIAGNOSIS — Z9849 Cataract extraction status, unspecified eye: Secondary | ICD-10-CM | POA: Diagnosis not present

## 2022-11-26 DIAGNOSIS — M1612 Unilateral primary osteoarthritis, left hip: Secondary | ICD-10-CM | POA: Diagnosis not present

## 2023-01-13 DIAGNOSIS — M810 Age-related osteoporosis without current pathological fracture: Secondary | ICD-10-CM | POA: Diagnosis not present

## 2023-01-13 DIAGNOSIS — F5104 Psychophysiologic insomnia: Secondary | ICD-10-CM | POA: Diagnosis not present

## 2023-01-13 DIAGNOSIS — I10 Essential (primary) hypertension: Secondary | ICD-10-CM | POA: Diagnosis not present

## 2023-01-13 DIAGNOSIS — E7800 Pure hypercholesterolemia, unspecified: Secondary | ICD-10-CM | POA: Diagnosis not present

## 2023-01-13 DIAGNOSIS — M069 Rheumatoid arthritis, unspecified: Secondary | ICD-10-CM | POA: Diagnosis not present

## 2023-01-13 DIAGNOSIS — N1831 Chronic kidney disease, stage 3a: Secondary | ICD-10-CM | POA: Diagnosis not present

## 2023-01-13 DIAGNOSIS — Z79899 Other long term (current) drug therapy: Secondary | ICD-10-CM | POA: Diagnosis not present

## 2023-01-13 DIAGNOSIS — E559 Vitamin D deficiency, unspecified: Secondary | ICD-10-CM | POA: Diagnosis not present

## 2023-01-13 DIAGNOSIS — Z0000 Encounter for general adult medical examination without abnormal findings: Secondary | ICD-10-CM | POA: Diagnosis not present

## 2023-03-23 DIAGNOSIS — N952 Postmenopausal atrophic vaginitis: Secondary | ICD-10-CM | POA: Diagnosis not present

## 2023-03-23 DIAGNOSIS — M8588 Other specified disorders of bone density and structure, other site: Secondary | ICD-10-CM | POA: Diagnosis not present

## 2023-03-23 DIAGNOSIS — Z01419 Encounter for gynecological examination (general) (routine) without abnormal findings: Secondary | ICD-10-CM | POA: Diagnosis not present

## 2023-03-23 DIAGNOSIS — N958 Other specified menopausal and perimenopausal disorders: Secondary | ICD-10-CM | POA: Diagnosis not present

## 2023-03-23 DIAGNOSIS — Z1231 Encounter for screening mammogram for malignant neoplasm of breast: Secondary | ICD-10-CM | POA: Diagnosis not present

## 2023-03-23 DIAGNOSIS — Z6821 Body mass index (BMI) 21.0-21.9, adult: Secondary | ICD-10-CM | POA: Diagnosis not present

## 2023-04-06 DIAGNOSIS — R946 Abnormal results of thyroid function studies: Secondary | ICD-10-CM | POA: Diagnosis not present

## 2023-04-16 DIAGNOSIS — M1991 Primary osteoarthritis, unspecified site: Secondary | ICD-10-CM | POA: Diagnosis not present

## 2023-04-16 DIAGNOSIS — Z79899 Other long term (current) drug therapy: Secondary | ICD-10-CM | POA: Diagnosis not present

## 2023-04-16 DIAGNOSIS — Z6821 Body mass index (BMI) 21.0-21.9, adult: Secondary | ICD-10-CM | POA: Diagnosis not present

## 2023-04-16 DIAGNOSIS — M0609 Rheumatoid arthritis without rheumatoid factor, multiple sites: Secondary | ICD-10-CM | POA: Diagnosis not present

## 2023-08-03 DIAGNOSIS — M069 Rheumatoid arthritis, unspecified: Secondary | ICD-10-CM | POA: Diagnosis not present

## 2023-08-03 DIAGNOSIS — N1831 Chronic kidney disease, stage 3a: Secondary | ICD-10-CM | POA: Diagnosis not present

## 2023-08-03 DIAGNOSIS — R946 Abnormal results of thyroid function studies: Secondary | ICD-10-CM | POA: Diagnosis not present

## 2023-08-03 DIAGNOSIS — M810 Age-related osteoporosis without current pathological fracture: Secondary | ICD-10-CM | POA: Diagnosis not present

## 2023-08-03 DIAGNOSIS — I10 Essential (primary) hypertension: Secondary | ICD-10-CM | POA: Diagnosis not present

## 2023-09-09 DIAGNOSIS — H35363 Drusen (degenerative) of macula, bilateral: Secondary | ICD-10-CM | POA: Diagnosis not present

## 2023-09-09 DIAGNOSIS — H353131 Nonexudative age-related macular degeneration, bilateral, early dry stage: Secondary | ICD-10-CM | POA: Diagnosis not present

## 2023-09-22 DIAGNOSIS — H353132 Nonexudative age-related macular degeneration, bilateral, intermediate dry stage: Secondary | ICD-10-CM | POA: Diagnosis not present

## 2023-09-22 DIAGNOSIS — Z961 Presence of intraocular lens: Secondary | ICD-10-CM | POA: Diagnosis not present

## 2023-09-22 DIAGNOSIS — M069 Rheumatoid arthritis, unspecified: Secondary | ICD-10-CM | POA: Diagnosis not present

## 2023-09-22 DIAGNOSIS — Z79899 Other long term (current) drug therapy: Secondary | ICD-10-CM | POA: Diagnosis not present

## 2023-09-22 DIAGNOSIS — H43813 Vitreous degeneration, bilateral: Secondary | ICD-10-CM | POA: Diagnosis not present

## 2023-09-30 DIAGNOSIS — I10 Essential (primary) hypertension: Secondary | ICD-10-CM | POA: Diagnosis not present

## 2023-09-30 DIAGNOSIS — N1831 Chronic kidney disease, stage 3a: Secondary | ICD-10-CM | POA: Diagnosis not present

## 2023-10-19 DIAGNOSIS — M1612 Unilateral primary osteoarthritis, left hip: Secondary | ICD-10-CM | POA: Diagnosis not present

## 2023-10-26 DIAGNOSIS — Z6821 Body mass index (BMI) 21.0-21.9, adult: Secondary | ICD-10-CM | POA: Diagnosis not present

## 2023-10-26 DIAGNOSIS — M0609 Rheumatoid arthritis without rheumatoid factor, multiple sites: Secondary | ICD-10-CM | POA: Diagnosis not present

## 2023-10-26 DIAGNOSIS — M1991 Primary osteoarthritis, unspecified site: Secondary | ICD-10-CM | POA: Diagnosis not present

## 2023-10-26 DIAGNOSIS — Z79899 Other long term (current) drug therapy: Secondary | ICD-10-CM | POA: Diagnosis not present

## 2023-11-01 DIAGNOSIS — I10 Essential (primary) hypertension: Secondary | ICD-10-CM | POA: Diagnosis not present

## 2023-11-01 DIAGNOSIS — N1831 Chronic kidney disease, stage 3a: Secondary | ICD-10-CM | POA: Diagnosis not present

## 2023-11-02 DIAGNOSIS — N1831 Chronic kidney disease, stage 3a: Secondary | ICD-10-CM | POA: Diagnosis not present

## 2023-11-02 DIAGNOSIS — F325 Major depressive disorder, single episode, in full remission: Secondary | ICD-10-CM | POA: Diagnosis not present

## 2023-11-02 DIAGNOSIS — M810 Age-related osteoporosis without current pathological fracture: Secondary | ICD-10-CM | POA: Diagnosis not present

## 2023-11-02 DIAGNOSIS — I10 Essential (primary) hypertension: Secondary | ICD-10-CM | POA: Diagnosis not present

## 2023-11-02 DIAGNOSIS — E7800 Pure hypercholesterolemia, unspecified: Secondary | ICD-10-CM | POA: Diagnosis not present

## 2023-12-01 DIAGNOSIS — N1831 Chronic kidney disease, stage 3a: Secondary | ICD-10-CM | POA: Diagnosis not present

## 2023-12-01 DIAGNOSIS — I10 Essential (primary) hypertension: Secondary | ICD-10-CM | POA: Diagnosis not present

## 2023-12-03 DIAGNOSIS — I10 Essential (primary) hypertension: Secondary | ICD-10-CM | POA: Diagnosis not present

## 2023-12-03 DIAGNOSIS — N1831 Chronic kidney disease, stage 3a: Secondary | ICD-10-CM | POA: Diagnosis not present

## 2023-12-03 DIAGNOSIS — F325 Major depressive disorder, single episode, in full remission: Secondary | ICD-10-CM | POA: Diagnosis not present

## 2023-12-03 DIAGNOSIS — E7800 Pure hypercholesterolemia, unspecified: Secondary | ICD-10-CM | POA: Diagnosis not present

## 2023-12-03 DIAGNOSIS — M810 Age-related osteoporosis without current pathological fracture: Secondary | ICD-10-CM | POA: Diagnosis not present

## 2023-12-31 DIAGNOSIS — N1831 Chronic kidney disease, stage 3a: Secondary | ICD-10-CM | POA: Diagnosis not present

## 2023-12-31 DIAGNOSIS — I10 Essential (primary) hypertension: Secondary | ICD-10-CM | POA: Diagnosis not present

## 2024-01-03 DIAGNOSIS — I10 Essential (primary) hypertension: Secondary | ICD-10-CM | POA: Diagnosis not present

## 2024-01-03 DIAGNOSIS — M810 Age-related osteoporosis without current pathological fracture: Secondary | ICD-10-CM | POA: Diagnosis not present

## 2024-01-03 DIAGNOSIS — N1831 Chronic kidney disease, stage 3a: Secondary | ICD-10-CM | POA: Diagnosis not present

## 2024-01-03 DIAGNOSIS — F325 Major depressive disorder, single episode, in full remission: Secondary | ICD-10-CM | POA: Diagnosis not present

## 2024-01-03 DIAGNOSIS — E7800 Pure hypercholesterolemia, unspecified: Secondary | ICD-10-CM | POA: Diagnosis not present

## 2024-01-26 DIAGNOSIS — N1831 Chronic kidney disease, stage 3a: Secondary | ICD-10-CM | POA: Diagnosis not present

## 2024-01-26 DIAGNOSIS — I10 Essential (primary) hypertension: Secondary | ICD-10-CM | POA: Diagnosis not present

## 2024-01-26 DIAGNOSIS — M810 Age-related osteoporosis without current pathological fracture: Secondary | ICD-10-CM | POA: Diagnosis not present

## 2024-01-26 DIAGNOSIS — M069 Rheumatoid arthritis, unspecified: Secondary | ICD-10-CM | POA: Diagnosis not present

## 2024-01-26 DIAGNOSIS — Z0000 Encounter for general adult medical examination without abnormal findings: Secondary | ICD-10-CM | POA: Diagnosis not present

## 2024-01-26 DIAGNOSIS — F5104 Psychophysiologic insomnia: Secondary | ICD-10-CM | POA: Diagnosis not present

## 2024-01-26 DIAGNOSIS — R946 Abnormal results of thyroid function studies: Secondary | ICD-10-CM | POA: Diagnosis not present

## 2024-01-26 DIAGNOSIS — Z79899 Other long term (current) drug therapy: Secondary | ICD-10-CM | POA: Diagnosis not present

## 2024-01-26 DIAGNOSIS — E7800 Pure hypercholesterolemia, unspecified: Secondary | ICD-10-CM | POA: Diagnosis not present

## 2024-01-26 DIAGNOSIS — E559 Vitamin D deficiency, unspecified: Secondary | ICD-10-CM | POA: Diagnosis not present

## 2024-01-30 DIAGNOSIS — I10 Essential (primary) hypertension: Secondary | ICD-10-CM | POA: Diagnosis not present

## 2024-01-30 DIAGNOSIS — N1831 Chronic kidney disease, stage 3a: Secondary | ICD-10-CM | POA: Diagnosis not present

## 2024-02-02 DIAGNOSIS — F325 Major depressive disorder, single episode, in full remission: Secondary | ICD-10-CM | POA: Diagnosis not present

## 2024-02-02 DIAGNOSIS — M810 Age-related osteoporosis without current pathological fracture: Secondary | ICD-10-CM | POA: Diagnosis not present

## 2024-02-02 DIAGNOSIS — N1831 Chronic kidney disease, stage 3a: Secondary | ICD-10-CM | POA: Diagnosis not present

## 2024-02-02 DIAGNOSIS — E7800 Pure hypercholesterolemia, unspecified: Secondary | ICD-10-CM | POA: Diagnosis not present

## 2024-02-02 DIAGNOSIS — I10 Essential (primary) hypertension: Secondary | ICD-10-CM | POA: Diagnosis not present

## 2024-02-29 DIAGNOSIS — I10 Essential (primary) hypertension: Secondary | ICD-10-CM | POA: Diagnosis not present

## 2024-02-29 DIAGNOSIS — N1831 Chronic kidney disease, stage 3a: Secondary | ICD-10-CM | POA: Diagnosis not present

## 2024-03-04 DIAGNOSIS — E7800 Pure hypercholesterolemia, unspecified: Secondary | ICD-10-CM | POA: Diagnosis not present

## 2024-03-04 DIAGNOSIS — F325 Major depressive disorder, single episode, in full remission: Secondary | ICD-10-CM | POA: Diagnosis not present

## 2024-03-04 DIAGNOSIS — N1831 Chronic kidney disease, stage 3a: Secondary | ICD-10-CM | POA: Diagnosis not present

## 2024-03-04 DIAGNOSIS — I10 Essential (primary) hypertension: Secondary | ICD-10-CM | POA: Diagnosis not present

## 2024-03-04 DIAGNOSIS — M810 Age-related osteoporosis without current pathological fracture: Secondary | ICD-10-CM | POA: Diagnosis not present

## 2024-03-23 DIAGNOSIS — N952 Postmenopausal atrophic vaginitis: Secondary | ICD-10-CM | POA: Diagnosis not present

## 2024-03-23 DIAGNOSIS — Z124 Encounter for screening for malignant neoplasm of cervix: Secondary | ICD-10-CM | POA: Diagnosis not present

## 2024-03-23 DIAGNOSIS — Z6821 Body mass index (BMI) 21.0-21.9, adult: Secondary | ICD-10-CM | POA: Diagnosis not present

## 2024-03-23 DIAGNOSIS — Z1231 Encounter for screening mammogram for malignant neoplasm of breast: Secondary | ICD-10-CM | POA: Diagnosis not present

## 2024-03-30 DIAGNOSIS — N1831 Chronic kidney disease, stage 3a: Secondary | ICD-10-CM | POA: Diagnosis not present

## 2024-03-30 DIAGNOSIS — I10 Essential (primary) hypertension: Secondary | ICD-10-CM | POA: Diagnosis not present

## 2024-04-03 DIAGNOSIS — E7800 Pure hypercholesterolemia, unspecified: Secondary | ICD-10-CM | POA: Diagnosis not present

## 2024-04-03 DIAGNOSIS — M810 Age-related osteoporosis without current pathological fracture: Secondary | ICD-10-CM | POA: Diagnosis not present

## 2024-04-03 DIAGNOSIS — N1831 Chronic kidney disease, stage 3a: Secondary | ICD-10-CM | POA: Diagnosis not present

## 2024-04-03 DIAGNOSIS — F325 Major depressive disorder, single episode, in full remission: Secondary | ICD-10-CM | POA: Diagnosis not present

## 2024-04-03 DIAGNOSIS — I10 Essential (primary) hypertension: Secondary | ICD-10-CM | POA: Diagnosis not present
